# Patient Record
Sex: Female | Born: 1965 | Race: White | Hispanic: No | Smoking: Current some day smoker
Health system: Southern US, Community
[De-identification: ages and names within clinical notes are randomized; demographics above are authoritative.]

## PROBLEM LIST (undated history)

## (undated) DIAGNOSIS — F319 Bipolar disorder, unspecified: Secondary | ICD-10-CM

## (undated) DIAGNOSIS — I493 Ventricular premature depolarization: Secondary | ICD-10-CM

## (undated) DIAGNOSIS — G4700 Insomnia, unspecified: Secondary | ICD-10-CM

## (undated) DIAGNOSIS — F329 Major depressive disorder, single episode, unspecified: Secondary | ICD-10-CM

## (undated) DIAGNOSIS — T783XXA Angioneurotic edema, initial encounter: Secondary | ICD-10-CM

## (undated) DIAGNOSIS — N3010 Interstitial cystitis (chronic) without hematuria: Secondary | ICD-10-CM

## (undated) DIAGNOSIS — B009 Herpesviral infection, unspecified: Secondary | ICD-10-CM

## (undated) DIAGNOSIS — K580 Irritable bowel syndrome with diarrhea: Secondary | ICD-10-CM

## (undated) DIAGNOSIS — F32A Depression, unspecified: Secondary | ICD-10-CM

## (undated) DIAGNOSIS — R937 Abnormal findings on diagnostic imaging of other parts of musculoskeletal system: Secondary | ICD-10-CM

## (undated) DIAGNOSIS — R87629 Unspecified abnormal cytological findings in specimens from vagina: Secondary | ICD-10-CM

## (undated) DIAGNOSIS — I499 Cardiac arrhythmia, unspecified: Secondary | ICD-10-CM

## (undated) DIAGNOSIS — F419 Anxiety disorder, unspecified: Secondary | ICD-10-CM

## (undated) DIAGNOSIS — S92911A Unspecified fracture of right toe(s), initial encounter for closed fracture: Secondary | ICD-10-CM

## (undated) HISTORY — DX: Abnormal findings on diagnostic imaging of other parts of musculoskeletal system: R93.7

## (undated) HISTORY — PX: BACK SURGERY: SHX140

## (undated) HISTORY — DX: Irritable bowel syndrome with diarrhea: K58.0

## (undated) HISTORY — DX: Anxiety disorder, unspecified: F41.9

## (undated) HISTORY — DX: Unspecified abnormal cytological findings in specimens from vagina: R87.629

## (undated) HISTORY — DX: Angioneurotic edema, initial encounter: T78.3XXA

## (undated) HISTORY — DX: Interstitial cystitis (chronic) without hematuria: N30.10

## (undated) HISTORY — DX: Herpesviral infection, unspecified: B00.9

## (undated) HISTORY — DX: Cardiac arrhythmia, unspecified: I49.9

## (undated) HISTORY — DX: Depression, unspecified: F32.A

## (undated) HISTORY — DX: Major depressive disorder, single episode, unspecified: F32.9

---

## 1998-10-11 ENCOUNTER — Encounter (INDEPENDENT_AMBULATORY_CARE_PROVIDER_SITE_OTHER): Payer: Self-pay | Admitting: Internal Medicine

## 1998-10-11 LAB — CONVERTED CEMR LAB: Pap Smear: NORMAL

## 1999-10-29 ENCOUNTER — Other Ambulatory Visit: Admission: RE | Admit: 1999-10-29 | Discharge: 1999-10-29 | Payer: Self-pay | Admitting: Obstetrics and Gynecology

## 1999-11-12 DIAGNOSIS — N3010 Interstitial cystitis (chronic) without hematuria: Secondary | ICD-10-CM | POA: Insufficient documentation

## 1999-11-22 ENCOUNTER — Encounter (INDEPENDENT_AMBULATORY_CARE_PROVIDER_SITE_OTHER): Payer: Self-pay

## 1999-11-22 ENCOUNTER — Ambulatory Visit (HOSPITAL_COMMUNITY): Admission: RE | Admit: 1999-11-22 | Discharge: 1999-11-22 | Payer: Self-pay | Admitting: Urology

## 2000-10-28 ENCOUNTER — Other Ambulatory Visit: Admission: RE | Admit: 2000-10-28 | Discharge: 2000-10-28 | Payer: Self-pay | Admitting: Obstetrics and Gynecology

## 2000-12-26 ENCOUNTER — Ambulatory Visit (HOSPITAL_COMMUNITY): Admission: RE | Admit: 2000-12-26 | Discharge: 2000-12-26 | Payer: Self-pay | Admitting: *Deleted

## 2001-11-12 ENCOUNTER — Other Ambulatory Visit: Admission: RE | Admit: 2001-11-12 | Discharge: 2001-11-12 | Payer: Self-pay | Admitting: Obstetrics and Gynecology

## 2002-11-06 ENCOUNTER — Emergency Department (HOSPITAL_COMMUNITY): Admission: EM | Admit: 2002-11-06 | Discharge: 2002-11-06 | Payer: Self-pay | Admitting: *Deleted

## 2003-04-15 ENCOUNTER — Other Ambulatory Visit: Admission: RE | Admit: 2003-04-15 | Discharge: 2003-04-15 | Payer: Self-pay | Admitting: Obstetrics and Gynecology

## 2004-05-08 ENCOUNTER — Other Ambulatory Visit: Admission: RE | Admit: 2004-05-08 | Discharge: 2004-05-08 | Payer: Self-pay | Admitting: Obstetrics and Gynecology

## 2004-05-09 ENCOUNTER — Other Ambulatory Visit: Admission: RE | Admit: 2004-05-09 | Discharge: 2004-05-09 | Payer: Self-pay | Admitting: Obstetrics and Gynecology

## 2004-09-19 ENCOUNTER — Ambulatory Visit: Payer: Self-pay | Admitting: Internal Medicine

## 2004-12-24 ENCOUNTER — Ambulatory Visit: Payer: Self-pay | Admitting: Family Medicine

## 2005-01-29 ENCOUNTER — Emergency Department (HOSPITAL_COMMUNITY): Admission: EM | Admit: 2005-01-29 | Discharge: 2005-01-29 | Payer: Self-pay | Admitting: Family Medicine

## 2005-02-16 ENCOUNTER — Emergency Department (HOSPITAL_COMMUNITY): Admission: EM | Admit: 2005-02-16 | Discharge: 2005-02-16 | Payer: Self-pay | Admitting: Emergency Medicine

## 2005-02-19 ENCOUNTER — Other Ambulatory Visit: Admission: RE | Admit: 2005-02-19 | Discharge: 2005-02-19 | Payer: Self-pay | Admitting: Obstetrics and Gynecology

## 2005-06-11 ENCOUNTER — Ambulatory Visit: Payer: Self-pay | Admitting: Family Medicine

## 2005-10-02 ENCOUNTER — Other Ambulatory Visit: Admission: RE | Admit: 2005-10-02 | Discharge: 2005-10-02 | Payer: Self-pay | Admitting: Obstetrics and Gynecology

## 2005-10-24 ENCOUNTER — Ambulatory Visit: Payer: Self-pay | Admitting: Family Medicine

## 2005-11-25 ENCOUNTER — Ambulatory Visit: Payer: Self-pay | Admitting: Family Medicine

## 2006-04-09 ENCOUNTER — Ambulatory Visit: Payer: Self-pay | Admitting: Family Medicine

## 2006-07-23 ENCOUNTER — Ambulatory Visit: Payer: Self-pay | Admitting: Family Medicine

## 2007-03-30 ENCOUNTER — Ambulatory Visit: Payer: Self-pay | Admitting: Family Medicine

## 2007-03-30 DIAGNOSIS — F411 Generalized anxiety disorder: Secondary | ICD-10-CM | POA: Insufficient documentation

## 2007-07-10 ENCOUNTER — Encounter: Admission: RE | Admit: 2007-07-10 | Discharge: 2007-07-10 | Payer: Self-pay | Admitting: Obstetrics and Gynecology

## 2007-08-21 ENCOUNTER — Ambulatory Visit (HOSPITAL_BASED_OUTPATIENT_CLINIC_OR_DEPARTMENT_OTHER): Admission: RE | Admit: 2007-08-21 | Discharge: 2007-08-21 | Payer: Self-pay | Admitting: Urology

## 2007-08-21 ENCOUNTER — Encounter (INDEPENDENT_AMBULATORY_CARE_PROVIDER_SITE_OTHER): Payer: Self-pay | Admitting: Urology

## 2007-11-27 ENCOUNTER — Ambulatory Visit: Payer: Self-pay | Admitting: Family Medicine

## 2007-11-27 DIAGNOSIS — R059 Cough, unspecified: Secondary | ICD-10-CM | POA: Insufficient documentation

## 2007-11-27 DIAGNOSIS — R5381 Other malaise: Secondary | ICD-10-CM | POA: Insufficient documentation

## 2007-11-27 DIAGNOSIS — R5383 Other fatigue: Secondary | ICD-10-CM

## 2007-11-27 DIAGNOSIS — R05 Cough: Secondary | ICD-10-CM

## 2007-11-27 DIAGNOSIS — R071 Chest pain on breathing: Secondary | ICD-10-CM | POA: Insufficient documentation

## 2007-12-09 ENCOUNTER — Telehealth (INDEPENDENT_AMBULATORY_CARE_PROVIDER_SITE_OTHER): Payer: Self-pay | Admitting: Internal Medicine

## 2007-12-29 ENCOUNTER — Telehealth (INDEPENDENT_AMBULATORY_CARE_PROVIDER_SITE_OTHER): Payer: Self-pay | Admitting: Internal Medicine

## 2007-12-30 ENCOUNTER — Telehealth (INDEPENDENT_AMBULATORY_CARE_PROVIDER_SITE_OTHER): Payer: Self-pay | Admitting: Internal Medicine

## 2008-03-16 ENCOUNTER — Ambulatory Visit: Payer: Self-pay | Admitting: Internal Medicine

## 2008-03-16 DIAGNOSIS — F329 Major depressive disorder, single episode, unspecified: Secondary | ICD-10-CM | POA: Insufficient documentation

## 2008-03-16 DIAGNOSIS — R51 Headache: Secondary | ICD-10-CM | POA: Insufficient documentation

## 2008-03-16 DIAGNOSIS — R519 Headache, unspecified: Secondary | ICD-10-CM | POA: Insufficient documentation

## 2008-03-30 ENCOUNTER — Encounter (INDEPENDENT_AMBULATORY_CARE_PROVIDER_SITE_OTHER): Payer: Self-pay | Admitting: Internal Medicine

## 2008-05-26 ENCOUNTER — Ambulatory Visit: Payer: Self-pay | Admitting: Family Medicine

## 2008-05-26 DIAGNOSIS — M79609 Pain in unspecified limb: Secondary | ICD-10-CM | POA: Insufficient documentation

## 2008-05-26 DIAGNOSIS — T23079A Burn of unspecified degree of unspecified wrist, initial encounter: Secondary | ICD-10-CM | POA: Insufficient documentation

## 2008-06-02 ENCOUNTER — Telehealth: Payer: Self-pay | Admitting: Family Medicine

## 2008-06-21 ENCOUNTER — Telehealth (INDEPENDENT_AMBULATORY_CARE_PROVIDER_SITE_OTHER): Payer: Self-pay | Admitting: Internal Medicine

## 2008-07-13 ENCOUNTER — Telehealth (INDEPENDENT_AMBULATORY_CARE_PROVIDER_SITE_OTHER): Payer: Self-pay | Admitting: Internal Medicine

## 2008-08-02 ENCOUNTER — Ambulatory Visit: Payer: Self-pay | Admitting: Family Medicine

## 2008-08-02 DIAGNOSIS — G4700 Insomnia, unspecified: Secondary | ICD-10-CM | POA: Insufficient documentation

## 2008-08-02 DIAGNOSIS — K5289 Other specified noninfective gastroenteritis and colitis: Secondary | ICD-10-CM | POA: Insufficient documentation

## 2008-08-18 ENCOUNTER — Telehealth (INDEPENDENT_AMBULATORY_CARE_PROVIDER_SITE_OTHER): Payer: Self-pay | Admitting: Internal Medicine

## 2008-08-24 ENCOUNTER — Ambulatory Visit: Payer: Self-pay | Admitting: Family Medicine

## 2008-08-24 DIAGNOSIS — N390 Urinary tract infection, site not specified: Secondary | ICD-10-CM | POA: Insufficient documentation

## 2008-08-24 LAB — CONVERTED CEMR LAB
Bilirubin Urine: NEGATIVE
Glucose, Urine, Semiquant: NEGATIVE
Ketones, urine, test strip: NEGATIVE
Nitrite: NEGATIVE
Specific Gravity, Urine: 1.02
Urobilinogen, UA: 0.2

## 2008-08-25 ENCOUNTER — Telehealth (INDEPENDENT_AMBULATORY_CARE_PROVIDER_SITE_OTHER): Payer: Self-pay | Admitting: Internal Medicine

## 2008-09-02 ENCOUNTER — Ambulatory Visit: Payer: Self-pay | Admitting: Family Medicine

## 2008-09-29 ENCOUNTER — Telehealth (INDEPENDENT_AMBULATORY_CARE_PROVIDER_SITE_OTHER): Payer: Self-pay | Admitting: Internal Medicine

## 2008-10-26 ENCOUNTER — Ambulatory Visit: Payer: Self-pay | Admitting: Family Medicine

## 2008-11-14 ENCOUNTER — Telehealth: Payer: Self-pay | Admitting: Family Medicine

## 2009-03-16 ENCOUNTER — Ambulatory Visit: Payer: Self-pay | Admitting: Family Medicine

## 2009-03-16 DIAGNOSIS — J309 Allergic rhinitis, unspecified: Secondary | ICD-10-CM | POA: Insufficient documentation

## 2009-03-16 DIAGNOSIS — H9209 Otalgia, unspecified ear: Secondary | ICD-10-CM | POA: Insufficient documentation

## 2009-03-30 ENCOUNTER — Telehealth (INDEPENDENT_AMBULATORY_CARE_PROVIDER_SITE_OTHER): Payer: Self-pay | Admitting: Internal Medicine

## 2009-04-17 ENCOUNTER — Telehealth: Payer: Self-pay | Admitting: Family Medicine

## 2009-04-19 ENCOUNTER — Ambulatory Visit: Payer: Self-pay | Admitting: Family Medicine

## 2009-04-19 DIAGNOSIS — M62838 Other muscle spasm: Secondary | ICD-10-CM | POA: Insufficient documentation

## 2009-08-22 ENCOUNTER — Telehealth (INDEPENDENT_AMBULATORY_CARE_PROVIDER_SITE_OTHER): Payer: Self-pay | Admitting: Internal Medicine

## 2009-09-14 ENCOUNTER — Telehealth: Payer: Self-pay | Admitting: Family Medicine

## 2009-09-21 ENCOUNTER — Telehealth: Payer: Self-pay | Admitting: Family Medicine

## 2009-11-11 HISTORY — PX: OTHER SURGICAL HISTORY: SHX169

## 2009-11-11 HISTORY — PX: GANGLION CYST EXCISION: SHX1691

## 2009-12-13 ENCOUNTER — Ambulatory Visit: Payer: Self-pay | Admitting: Family Medicine

## 2009-12-28 ENCOUNTER — Emergency Department (HOSPITAL_COMMUNITY): Admission: EM | Admit: 2009-12-28 | Discharge: 2009-12-28 | Payer: Self-pay | Admitting: Family Medicine

## 2010-01-02 ENCOUNTER — Telehealth: Payer: Self-pay | Admitting: Family Medicine

## 2010-01-22 ENCOUNTER — Telehealth: Payer: Self-pay | Admitting: Family Medicine

## 2010-01-25 ENCOUNTER — Ambulatory Visit: Payer: Self-pay | Admitting: Family Medicine

## 2010-01-25 DIAGNOSIS — R0789 Other chest pain: Secondary | ICD-10-CM | POA: Insufficient documentation

## 2010-01-31 ENCOUNTER — Telehealth: Payer: Self-pay | Admitting: Family Medicine

## 2010-01-31 ENCOUNTER — Ambulatory Visit: Payer: Self-pay | Admitting: Cardiology

## 2010-01-31 DIAGNOSIS — R0989 Other specified symptoms and signs involving the circulatory and respiratory systems: Secondary | ICD-10-CM

## 2010-01-31 DIAGNOSIS — R0609 Other forms of dyspnea: Secondary | ICD-10-CM | POA: Insufficient documentation

## 2010-02-09 ENCOUNTER — Encounter: Payer: Self-pay | Admitting: Cardiology

## 2010-02-09 ENCOUNTER — Ambulatory Visit: Payer: Self-pay | Admitting: Cardiovascular Disease

## 2010-02-09 ENCOUNTER — Ambulatory Visit (HOSPITAL_COMMUNITY): Admission: RE | Admit: 2010-02-09 | Discharge: 2010-02-09 | Payer: Self-pay | Admitting: Cardiology

## 2010-02-09 ENCOUNTER — Ambulatory Visit: Payer: Self-pay

## 2010-02-16 ENCOUNTER — Telehealth: Payer: Self-pay | Admitting: Cardiology

## 2010-02-27 ENCOUNTER — Telehealth (INDEPENDENT_AMBULATORY_CARE_PROVIDER_SITE_OTHER): Payer: Self-pay | Admitting: *Deleted

## 2010-02-28 ENCOUNTER — Telehealth: Payer: Self-pay | Admitting: Cardiology

## 2010-02-28 ENCOUNTER — Ambulatory Visit: Payer: Self-pay

## 2010-02-28 ENCOUNTER — Encounter (HOSPITAL_COMMUNITY): Admission: RE | Admit: 2010-02-28 | Discharge: 2010-05-15 | Payer: Self-pay | Admitting: Cardiology

## 2010-02-28 ENCOUNTER — Encounter: Payer: Self-pay | Admitting: Cardiology

## 2010-02-28 ENCOUNTER — Ambulatory Visit: Payer: Self-pay | Admitting: Cardiology

## 2010-02-28 ENCOUNTER — Telehealth: Payer: Self-pay | Admitting: Family Medicine

## 2010-02-28 DIAGNOSIS — I472 Ventricular tachycardia: Secondary | ICD-10-CM | POA: Insufficient documentation

## 2010-02-28 DIAGNOSIS — I4729 Other ventricular tachycardia: Secondary | ICD-10-CM | POA: Insufficient documentation

## 2010-03-01 ENCOUNTER — Telehealth: Payer: Self-pay | Admitting: Family Medicine

## 2010-03-01 LAB — CONVERTED CEMR LAB
BUN: 10 mg/dL (ref 6–23)
CO2: 26 meq/L (ref 19–32)
Calcium: 9.5 mg/dL (ref 8.4–10.5)
Chloride: 100 meq/L (ref 96–112)
Creatinine, Ser: 0.9 mg/dL (ref 0.4–1.2)
Glucose, Bld: 88 mg/dL (ref 70–99)

## 2010-03-06 ENCOUNTER — Encounter (INDEPENDENT_AMBULATORY_CARE_PROVIDER_SITE_OTHER): Payer: Self-pay | Admitting: *Deleted

## 2010-03-13 ENCOUNTER — Ambulatory Visit: Payer: Self-pay | Admitting: Cardiology

## 2010-03-13 ENCOUNTER — Ambulatory Visit (HOSPITAL_COMMUNITY): Admission: RE | Admit: 2010-03-13 | Discharge: 2010-03-13 | Payer: Self-pay | Admitting: Cardiology

## 2010-03-27 ENCOUNTER — Telehealth: Payer: Self-pay | Admitting: Family Medicine

## 2010-03-29 ENCOUNTER — Ambulatory Visit: Payer: Self-pay | Admitting: Internal Medicine

## 2010-04-06 ENCOUNTER — Ambulatory Visit: Payer: Self-pay | Admitting: Cardiology

## 2010-04-06 DIAGNOSIS — I428 Other cardiomyopathies: Secondary | ICD-10-CM | POA: Insufficient documentation

## 2010-04-11 ENCOUNTER — Telehealth: Payer: Self-pay | Admitting: Cardiology

## 2010-04-12 LAB — CONVERTED CEMR LAB: TSH: 1.38 microintl units/mL (ref 0.35–5.50)

## 2010-04-17 ENCOUNTER — Ambulatory Visit: Payer: Self-pay | Admitting: Cardiology

## 2010-04-25 ENCOUNTER — Telehealth: Payer: Self-pay | Admitting: Cardiology

## 2010-06-05 ENCOUNTER — Ambulatory Visit: Payer: Self-pay | Admitting: Family Medicine

## 2010-06-05 DIAGNOSIS — M6740 Ganglion, unspecified site: Secondary | ICD-10-CM | POA: Insufficient documentation

## 2010-06-05 LAB — CONVERTED CEMR LAB
Ketones, urine, test strip: NEGATIVE
Nitrite: NEGATIVE
Specific Gravity, Urine: >=1.03

## 2010-06-12 ENCOUNTER — Telehealth: Payer: Self-pay | Admitting: Family Medicine

## 2010-06-13 ENCOUNTER — Encounter (INDEPENDENT_AMBULATORY_CARE_PROVIDER_SITE_OTHER): Payer: Self-pay | Admitting: *Deleted

## 2010-06-13 ENCOUNTER — Telehealth: Payer: Self-pay | Admitting: Family Medicine

## 2010-06-18 ENCOUNTER — Encounter: Payer: Self-pay | Admitting: Family Medicine

## 2010-06-19 ENCOUNTER — Telehealth: Payer: Self-pay | Admitting: Family Medicine

## 2010-06-25 ENCOUNTER — Telehealth: Payer: Self-pay | Admitting: Cardiology

## 2010-06-25 ENCOUNTER — Encounter: Payer: Self-pay | Admitting: Cardiology

## 2010-06-28 ENCOUNTER — Ambulatory Visit (HOSPITAL_BASED_OUTPATIENT_CLINIC_OR_DEPARTMENT_OTHER): Admission: RE | Admit: 2010-06-28 | Discharge: 2010-06-28 | Payer: Self-pay | Admitting: Orthopedic Surgery

## 2010-08-11 DIAGNOSIS — R937 Abnormal findings on diagnostic imaging of other parts of musculoskeletal system: Secondary | ICD-10-CM

## 2010-08-11 HISTORY — DX: Abnormal findings on diagnostic imaging of other parts of musculoskeletal system: R93.7

## 2010-08-12 ENCOUNTER — Emergency Department (HOSPITAL_COMMUNITY): Admission: EM | Admit: 2010-08-12 | Discharge: 2010-08-12 | Payer: Self-pay | Admitting: Emergency Medicine

## 2010-08-13 ENCOUNTER — Telehealth: Payer: Self-pay | Admitting: Family Medicine

## 2010-08-14 ENCOUNTER — Telehealth: Payer: Self-pay | Admitting: Family Medicine

## 2010-08-14 ENCOUNTER — Encounter (INDEPENDENT_AMBULATORY_CARE_PROVIDER_SITE_OTHER): Payer: Self-pay | Admitting: *Deleted

## 2010-08-14 ENCOUNTER — Ambulatory Visit: Payer: Self-pay | Admitting: Family Medicine

## 2010-08-14 DIAGNOSIS — M5412 Radiculopathy, cervical region: Secondary | ICD-10-CM | POA: Insufficient documentation

## 2010-08-16 ENCOUNTER — Encounter: Admission: RE | Admit: 2010-08-16 | Discharge: 2010-08-16 | Payer: Self-pay | Admitting: Family Medicine

## 2010-08-20 ENCOUNTER — Encounter: Payer: Self-pay | Admitting: Family Medicine

## 2010-08-23 ENCOUNTER — Ambulatory Visit (HOSPITAL_COMMUNITY): Admission: RE | Admit: 2010-08-23 | Discharge: 2010-08-24 | Payer: Self-pay | Admitting: Neurosurgery

## 2010-09-07 ENCOUNTER — Telehealth: Payer: Self-pay | Admitting: Family Medicine

## 2010-10-15 ENCOUNTER — Encounter: Payer: Self-pay | Admitting: Family Medicine

## 2010-10-30 ENCOUNTER — Ambulatory Visit: Payer: Self-pay | Admitting: Family Medicine

## 2010-10-30 DIAGNOSIS — J020 Streptococcal pharyngitis: Secondary | ICD-10-CM | POA: Insufficient documentation

## 2010-11-08 ENCOUNTER — Telehealth: Payer: Self-pay | Admitting: Family Medicine

## 2010-11-19 ENCOUNTER — Telehealth: Payer: Self-pay | Admitting: Family Medicine

## 2010-12-02 ENCOUNTER — Encounter: Payer: Self-pay | Admitting: Obstetrics and Gynecology

## 2010-12-13 NOTE — Consult Note (Signed)
Summary: [facility]  [facility]   Imported By: Lanelle Bal 09/03/2010 08:24:24  _____________________________________________________________________  External Attachment:    Type:   Image     Comment:   External Document  Appended Document: [facility]    Clinical Lists Changes  Observations: Added new observation of PAST SURG HX: Ganglion cyst per Dr. Teressa Senter 2011 C6-7 fusion planned per Dr. [name] 2011 (09/03/2010 13:46)       Past History:  Past Surgical History: Ganglion cyst per Dr. Teressa Senter 2011 C6-7 fusion planned per Dr. [name] 2011

## 2010-12-13 NOTE — Letter (Signed)
Summary: [facility] Surgical Clearance   [facility] Clear[facility]   Imported By: Roderic Ovens 06/28/2010 15:21:23  _____________________________________________________________________  External Attachment:    Type:   Image     Comment:   External Document

## 2010-12-13 NOTE — Progress Notes (Signed)
Summary: refill request for ambien  Phone Note Refill Request Message from:  Fax from Pharmacy  Refills Requested: Medication #1:  AMBIEN 10 MG TABS 1/2 to 1 at bedtime as needed insomnia   Last Refilled: 01/31/2010 Faxed request from [facility], [phone].  Initial call taken by: Lowella Petties CMA,  February 28, 2010 12:34 PM    Prescriptions: AMBIEN 10 MG TABS (ZOLPIDEM TARTRATE) 1/2 to 1 at bedtime as needed insomnia  #30 x 5   Entered and Authorized by:   Shaune Leeks MD   Signed by:   Shaune Leeks MD on 02/28/2010   Method used:   Telephoned to ...       [facility]* (retail)       [address]       [place], [place]  [address]       Ph: [phone]       Fax: [phone]   RxID:   [id number]

## 2010-12-13 NOTE — Progress Notes (Signed)
Summary: refill request for ambien  Phone Note Refill Request Message from:  Fax from Pharmacy  Refills Requested: Medication #1:  AMBIEN 10 MG TABS 1/2 to 1 at bedtime as needed insomnia   Last Refilled: 01/31/2010 Faxed request from [facility], [phone].  Initial call taken by: Lowella Petties CMA,  March 01, 2010 4:53 PM  Follow-up for Phone Call        Done yesterday. Follow-up by: Shaune Leeks MD,  March 01, 2010 5:09 PM  Additional Follow-up for Phone Call Additional follow up Details #1::        Rob at [facility] notified by telephone. Additional Follow-up by: Sydell Axon LPN,  March 01, 2010 5:18 PM

## 2010-12-13 NOTE — Progress Notes (Signed)
Summary: pt requests diflucan  Phone Note Call from Patient Call back at Home Phone [phone]   Caller: Patient Call For: Dr. Para March Summary of Call: Pt states she has a yeast infection after taking antibiotic- itching, irritation.  She is asking for diflucan to be called to [facility]. Initial call taken by: Lowella Petties CMA, AAMA,  November 08, 2010 9:40 AM  Follow-up for Phone Call        rx sent.  Follow-up by: Crawford Givens MD,  November 08, 2010 9:55 AM  Additional Follow-up for Phone Call Additional follow up Details #1::        Patient Advised.  Additional Follow-up by: Delilah Shan CMA Duncan Dull),  November 08, 2010 12:48 PM    New/Updated Medications: DIFLUCAN 150 MG TABS (FLUCONAZOLE) 1 by mouth today and repeat in 1 week if needed. Prescriptions: DIFLUCAN 150 MG TABS (FLUCONAZOLE) 1 by mouth today and repeat in 1 week if needed.  #2 x 0   Entered and Authorized by:   Crawford Givens MD   Signed by:   Crawford Givens MD on 11/08/2010   Method used:   Electronically to        [facility]* (retail)       [address]       [place], [place]  [address]       Ph: [phone]       Fax: [phone]   RxID:   [id number]

## 2010-12-13 NOTE — Assessment & Plan Note (Signed)
Summary: hospital f/u/ Tricia [name]/alc   Vital Signs:  Patient profile:   45 year old female Height:      67.5 inches Weight:      186.4 pounds BMI:     28.87 Temp:     98.1 degrees F oral Pulse rate:   77 / minute Pulse rhythm:   regular BP sitting:   110 / 65  (right arm) Cuff size:   regular  Vitals Entered By: Melody Comas (August 14, 2010 3:28 PM) CC: hospital f/u   History of Present Illness: L arm numb since Sunday AM.  L sided CP.  Seen at ER; recs reviewed.  unremarkable exam from cards stand point.  CE were negative.  Dx'd with likely chest [place] pain.   Less pain with repositioning of L arm.  "it's like my arm is on fire down the side."  No neck pain and chest pain is resolved.  Sore on the back of L shoulder at scapula.  Oxycodone isn't helping the pain.  L arm is still numb.  Pt can move arm, no decrease in range of motion at elbow.  normal range of motion of the fingers.  No rash.  No FCNAV.    Allergies: 1)  ! Biaxin  Review of Systems       See HPI.  Otherwise negative.    Physical Exam  General:  A&O but uncomfortable NCAT MMM  neck w/o midline pain posteriorly.  supple but with decrease in range of motion for rotation due to pain on L c spine paraspinal muscles.  spurling's + No decrease in range of motion for shoulder but pain with abduction above the head. No impingement distally motor exam is wnl but change in sensation on L radial distribution L grip liimited by pain normal DTRs. 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  started sterapred pack and titrate gabapentin.  send for MRI and consider NS eval after that.  She understands.   Orders: Radiology Referral (Radiology)  Complete Medication List: 1)  Fluoxetine Hcl 20 Mg Caps (Fluoxetine hcl) .... Take 1 capsule by mouth once a day 2)  Valtrex 500 Mg Tabs (Valacyclovir hcl) .... Take  1 tablet by mouth twice a day 3)  Ambien 10 Mg Tabs (Zolpidem tartrate) .... 1/2 to 1 at bedtime as needed insomnia 4)  Buspar 15 Mg Tabs (Buspirone hcl) .... One tab by mouth two times a day 5)  Ocella 3-0.03 Mg Tabs (Drospirenone-ethinyl estradiol) .... Take one by mouth daily 6)  Oscella  .... Uad 7)  Toprol Xl 25 Mg Xr24h-tab (Metoprolol succinate) .... One twice a day 8)  Oxycodone-acetaminophen 5-325 Mg Tabs (Oxycodone-acetaminophen) .... Take 2 by mouth every 6 hours 9)  Naproxen 500 Mg Tabs (Naproxen) .... Take one by mouth two time daily 10)  Prednisone (pak) 10 Mg Tabs (Prednisone) .Marland Kitchen.. 12 day pack, take as directed with food. 11)  Gabapentin 300 Mg Caps (Gabapentin) .Marland Kitchen.. 1 by mouth at bedtime, increase up to 1 by mouth three times a day if pain persists and patient tolerates  Patient Instructions: 1)  Take the prednisone as directed with food and gradually increase the gabapentin dose (as long as it doesn't make your drowsy). 2)  See Shirlee Limerick about your referral before your leave today.  We'll contact you with the results of the MRI.  Prescriptions: GABAPENTIN 300 MG CAPS (GABAPENTIN) 1  by mouth at bedtime, increase up to 1 by mouth three times a day if pain persists and patient tolerates  #40 x 1   Entered and Authorized by:   Crawford Givens MD   Signed by:   Crawford Givens MD on 08/14/2010   Method used:   Electronically to        [facility]* (retail)       [address]       [place], [place]  [address]       Ph: [phone]       Fax: [phone]   RxID:   [id number] PREDNISONE (PAK) 10 MG TABS (PREDNISONE) 12 day pack, take as directed with food.  #1 pack x 0   Entered and Authorized by:   Crawford Givens MD   Signed by:   Crawford Givens MD on 08/14/2010   Method used:   Electronically to        [facility]* (retail)       [address]       [place], [place]  [address]       Ph: [phone]       Fax: [phone]   RxID:   [id number]   Current Allergies  (reviewed today): ! BIAXIN

## 2010-12-13 NOTE — Progress Notes (Signed)
Summary: monitor results 04/17/10-04/19/10  Phone Note Outgoing Call   Call placed by: Katina Dung, RN, BSN,  April 25, 2010 5:56 PM Call placed to: Patient Summary of Call: monitor results  Follow-up for Phone Call        Dr Shirlee Latch reviewed monitor 04/17/10-04/19/10-fairly frequent PVC's -he recommended increase Toprol XL 25mg  to two times a day --I talked with pt by telephone and she agreed to increase Toprol xl 25mg  to two times a day     New/Updated Medications: TOPROL XL 25 MG XR24H-TAB (METOPROLOL SUCCINATE) one twice a day Prescriptions: TOPROL XL 25 MG XR24H-TAB (METOPROLOL SUCCINATE) one twice a day  #60 x 11   Entered by:   Katina Dung, RN, BSN   Authorized by:   Marca Ancona, MD   Signed by:   Katina Dung, RN, BSN on 04/25/2010   Method used:   Electronically to        [facility]* (retail)       [address]       [place], [place]  [address]       Ph: [phone]       Fax: [phone]   RxID:   [id number]

## 2010-12-13 NOTE — Assessment & Plan Note (Signed)
Summary: FLU  LIKE SYMPTOM/DLO   Vital Signs:  Patient profile:   45 year old female Weight:      181 pounds BMI:     28.03 Temp:     98.1 degrees F oral Pulse rate:   72 / minute Pulse rhythm:   regular BP sitting:   100 / 60  (left arm) Cuff size:   regular  Vitals Entered By: Sydell Axon LPN (December 13, 2009 12:15 PM) CC: Flu like symptoms, body aches, fever, nasal congestion and headache   History of Present Illness: Pt here for sxs of 11/2 weeks...works in school. Now has been in bed last few days with pressure and body aches. She has been in bed aching since Mon...now 48 hrs.  She is overwhelmed with achiness...she has pain in the facial distribution. She has had minimal fever but lots of chills. IBP has caused sweats. She stayed in bed all day yesterday. Her mother brought her here. No eaqr pain but pressure in the TMJ and eye areas. Nasal congestion with PND. no ST. She has cough with laying down, not severe. Some SOB, no N/V, some diarrhea, some dizziness with getting up, not bad enough to cause nausea. She has taken IBP, Aleve , Nyquil.  Allergies: 1)  ! Biaxin 2)  ! Elio Forget  Physical Exam  General:  alert, well-developed, well-nourished, and well-hydrated.   Head:  normocephalic, atraumatic, and no abnormalities observed.  Sinuses min tend max distr. Eyes:  Conjunctiva clear bilaterally.  Ears:  External ear exam shows no significant lesions or deformities.  Otoscopic examination reveals clear canals, tympanic membranes are intact bilaterally without bulging, retraction, inflammation or discharge. Hearing is grossly normal bilaterally. Nose:  External nasal examination shows no deformity or inflammation. Nasal mucosa are pink and moist without lesions or exudates. Mildly inflamed. Mouth:  no exudates and pharyngeal erythema.  Mild thick clear PND. Neck:  No deformities, masses, or tenderness noted. Lungs:  Normal respiratory effort, chest expands symmetrically.  Lungs are clear to auscultation, no crackles or wheezes. Heart:  Normal rate and regular rhythm. S1 and S2 normal without gallop, murmur, click, rub or other extra sounds. Cervical Nodes:  Mild shoddy mobile ant cerv nodes.   Impression & Recommendations:  Problem # 1:  URI (ICD-465.9) Assessment New See instructions. Her updated medication list for this problem includes:    Ibuprofen 200 Mg Tabs (Ibuprofen) .Marland Kitchen... As needed    Aleve 220 Mg Tabs (Naproxen sodium) .Marland Kitchen... As needed    Tessalon 200 Mg Caps (Benzonatate) ..... One tab by mouth three times a day  Problem # 2:  SINUSITIS - ACUTE-NOS (ICD-461.9) Assessment: New  Does not appear infected but congested at present. Her updated medication list for this problem includes:    Tessalon 200 Mg Caps (Benzonatate) ..... One tab by mouth three times a day  Instructed on treatment. Call if symptoms persist or worsen.   Complete Medication List: 1)  Fluoxetine Hcl 20 Mg Caps (Fluoxetine hcl) .... Take 1 capsule by mouth once a day 2)  Valtrex 500 Mg Tabs (Valacyclovir hcl) .... Take 1 tablet by mouth twice a day 3)  Elmiron 100 Mg Caps (Pentosan polysulfate sodium) .... As directed 4)  Yasmin 28 3-0.03 Mg Tabs (Drospirenone-ethinyl estradiol) .... Take 1 tablet by mouth once a day 5)  Ambien 10 Mg Tabs (Zolpidem tartrate) .... 1/2 to 1 at bedtime as needed insomnia 6)  Buspar 15 Mg Tabs (Buspirone hcl) .... One tab by  mouth two times a day 7)  Flexeril 10 Mg Tabs (Cyclobenzaprine hcl) .... 1/2-1 tab by mouth three times a day as needed 8)  Ibuprofen 200 Mg Tabs (Ibuprofen) .... As needed 9)  Aleve 220 Mg Tabs (Naproxen sodium) .... As needed 10)  Tamiflu 75 Mg Caps (Oseltamivir phosphate) .... One tab by mouth two times a day 11)  Tessalon 200 Mg Caps (Benzonatate) .... One tab by mouth three times a day  Patient Instructions: 1)  Take Tamiflu. 2)  Take Tessalon for cough as needed. 3)  Take Guaifenesin by going to [facility] or [facility] and getting MUCOUS RELIEF EXPECTORANT (400mg ), take 11/2 tabs by mouth AM and NOON. 4)  Drink lots of fluids anytime taking Guaifenesin.  5)  Take Aleve 2 tabs after brfst and supper Prescriptions: TESSALON 200 MG CAPS (BENZONATATE) one tab by mouth three times a day  #40 x 0   Entered and Authorized by:   Shaune Leeks MD   Signed by:   Shaune Leeks MD on 12/13/2009   Method used:   Electronically to        [facility]* (retail)       [address]       [place], [place]  [address]       Ph: [phone]       Fax: [phone]   RxID:   [id number] TAMIFLU 75 MG CAPS (OSELTAMIVIR PHOSPHATE) one tab by mouth two times a day  #10 x 0   Entered and Authorized by:   Shaune Leeks MD   Signed by:   Shaune Leeks MD on 12/13/2009   Method used:   Electronically to        [facility]* (retail)       [address]       [place], [place]  [address]       Ph: [phone]       Fax: [phone]   RxID:   [id number]   Current Allergies (reviewed today): ! BIAXIN ! Elio Forget

## 2010-12-13 NOTE — Progress Notes (Signed)
Summary: Rx Zolpidem  Phone Note Refill Request Call back at [phone] Message from:  [facility] on January 31, 2010 10:35 AM  Refills Requested: Medication #1:  AMBIEN 10 MG TABS 1/2 to 1 at bedtime as needed insomnia   Last Refilled: 01/02/2010 Received faxed refill request, please advise   Method Requested: Telephone to Pharmacy Initial call taken by: Linde Gillis CMA Duncan Dull),  January 31, 2010 10:36 AM  Follow-up for Phone Call        Rx called to pharmacy Follow-up by: Linde Gillis CMA Duncan Dull),  January 31, 2010 1:01 PM    Prescriptions: AMBIEN 10 MG TABS (ZOLPIDEM TARTRATE) 1/2 to 1 at bedtime as needed insomnia  #30 x 0   Entered and Authorized by:   Ruthe Mannan MD   Signed by:   Ruthe Mannan MD on 01/31/2010   Method used:   Handwritten   RxID:   [id number]

## 2010-12-13 NOTE — Progress Notes (Signed)
Summary: refill request for valacyclovir  Phone Note Refill Request Message from:  Fax from Pharmacy  Refills Requested: Medication #1:  VALTREX 500 MG TABS Take 1 tablet by mouth twice a day   Last Refilled: 05/16/2010 Faxed request from [facility], [phone].  Initial call taken by: Lowella Petties CMA,  June 13, 2010 3:55 PM  Follow-up for Phone Call        done.  Follow-up by: Crawford Givens MD,  June 13, 2010 8:43 PM    Prescriptions: VALTREX 500 MG TABS (VALACYCLOVIR HCL) Take 1 tablet by mouth twice a day  #60 x 6   Entered and Authorized by:   Crawford Givens MD   Signed by:   Crawford Givens MD on 06/13/2010   Method used:   Electronically to        [facility]* (retail)       [address]       [place], [place]  [address]       Ph: [phone]       Fax: [phone]   RxID:   [id number]

## 2010-12-13 NOTE — Progress Notes (Signed)
Summary: Rx Buspirone  Phone Note Refill Request Call back at [phone] Message from:  [facility] on Mar 27, 2010 8:59 AM  Refills Requested: Medication #1:  BUSPAR 15 MG TABS one tab by mouth two times a day   Last Refilled: 02/28/2010 Received faxed refill request please advise.   Method Requested: Telephone to Pharmacy Initial call taken by: Linde Gillis CMA Duncan Dull),  Mar 27, 2010 8:59 AM    Prescriptions: BUSPAR 15 MG TABS (BUSPIRONE HCL) one tab by mouth two times a day  #60 x 5   Entered and Authorized by:   Shaune Leeks MD   Signed by:   Shaune Leeks MD on 03/27/2010   Method used:   Electronically to        [facility]* (retail)       [address]       [place], [place]  [address]       Ph: [phone]       Fax: [phone]   RxID:   [id number]

## 2010-12-13 NOTE — Letter (Signed)
Summary: Out of Work  [facility]  [address] [place], [place] [address]   Phone: [phone]  Fax: [phone]    December 13, 2009   Employee:  ROSIA SYME COBB    To Whom It May Concern:   For Medical reasons, please excuse the above named employee from work for the following dates:  Start:   12/11/2009  End:   12/14/2009  If you need additional information, please feel free to contact our office.         Sincerely,    Shaune Leeks MD

## 2010-12-13 NOTE — Progress Notes (Signed)
Summary: refill request for vicodin  Phone Note Refill Request Message from:  Fax from Pharmacy  Refills Requested: Medication #1:  VICODIN 5-500 MG TABS 1 by mouth three times a day as needed pain   Last Refilled: 06/05/2010 Faxed request from [facility], [phone].   Initial call taken by: Lowella Petties CMA,  June 12, 2010 10:26 AM  Follow-up for Phone Call        okay to call in.  If she is continuing to have abdominal pain, then I want her to follow up with [facility]RO (she has history of interstitial cystitis) Follow-up by: Crawford Givens MD,  June 12, 2010 10:44 AM  Additional Follow-up for Phone Call Additional follow up Details #1::        Medication phoned to pharmacy. Patient Advised.  Additional Follow-up by: Delilah Shan CMA (AAMA),  June 12, 2010 12:09 PM    Prescriptions: VICODIN 5-500 MG TABS (HYDROCODONE-ACETAMINOPHEN) 1 by mouth three times a day as needed pain, sedation caution  #15 x 0   Entered and Authorized by:   Crawford Givens MD   Signed by:   Crawford Givens MD on 06/12/2010   Method used:   Telephoned to ...       [facility]* (retail)       [address]       [place], [place]  [address]       Ph: [phone]       Fax: [phone]   RxID:   [id number]

## 2010-12-13 NOTE — Consult Note (Signed)
Summary: [facility]   [facility]   Imported By: Maryln Gottron 06/22/2010 15:47:52  _____________________________________________________________________  External Attachment:    Type:   Image     Comment:   External Document  Appended Document: [facility]     Clinical Lists Changes  Observations: Added new observation of PAST SURG HX: Ganglion cyst per Dr. Teressa Senter 2011 (06/23/2010 19:37) Added new observation of PAST MED HX: 1. Anxiety/Panic 2. Depression 3. Interstitial cystitis 4. Ventricular ectopy: Patient has been noted to have PVCs and a short run of NSVT while on the treadmill for an ETT.  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  Cardiac MRI (5/11) showed EF 50%, mildly dilated LV, normal RV with no evidence for ARVC, and no myocardial delayed enhancement.  5. Cardiomyopathy: EF 45% by myoview, 45-50% by echo, 50% by MRI.  ? PVC-associated cardiomyopathy.  6. Chest pain: ETT-myoview (4/11) 6'15", frequent PVCs and run NSVT, mild global HK with EF 45%, fixed anterior defect was likely soft tissue attenuation so no evidence for ischemia or infarction.   (06/23/2010 19:37)       Past History:  Past Medical History: 1. Anxiety/Panic 2. Depression 3. Interstitial cystitis 4. Ventricular ectopy: Patient has been noted to have PVCs and a short run of NSVT while on the treadmill for an ETT.  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  Cardiac MRI (5/11) showed EF 50%, mildly dilated LV, normal RV with no evidence for ARVC, and no myocardial delayed enhancement.  5. Cardiomyopathy: EF 45% by myoview, 45-50% by echo, 50% by MRI.  ? PVC-associated cardiomyopathy.  6. Chest pain: ETT-myoview (4/11) 6'15", frequent PVCs and run NSVT, mild global HK with EF 45%, fixed anterior defect was likely  soft tissue attenuation so no evidence for ischemia or infarction.   Past Surgical History: Ganglion cyst per Dr. Teressa Senter 2011

## 2010-12-13 NOTE — Progress Notes (Signed)
Summary: refill request for ambien  Phone Note Refill Request Message from:  Fax from Pharmacy  Refills Requested: Medication #1:  AMBIEN 10 MG TABS 1/2 to 1 at bedtime as needed insomnia   Last Refilled: 07/21/2010 Faxed request from [facility], [phone].  Initial call taken by: Lowella Petties CMA,  August 14, 2010 4:18 PM  Follow-up for Phone Call        please phone in . Follow-up by: Crawford Givens MD,  August 14, 2010 10:58 PM  Additional Follow-up for Phone Call Additional follow up Details #1::        Medication phoned to pharmacy.  Additional Follow-up by: Delilah Shan CMA (AAMA),  August 15, 2010 10:08 AM    Prescriptions: AMBIEN 10 MG TABS (ZOLPIDEM TARTRATE) 1/2 to 1 at bedtime as needed insomnia  #30 x 5   Entered and Authorized by:   Crawford Givens MD   Signed by:   Crawford Givens MD on 08/14/2010   Method used:   Telephoned to ...       [facility]* (retail)       [address]       [place], [place]  [address]       Ph: [phone]       Fax: [phone]   RxID:   [id number]

## 2010-12-13 NOTE — Letter (Signed)
Summary: Out of Work  [facility]  [address] [place], [place] [address]   Phone: [phone]  Fax: [phone]    October 30, 2010   Employee:  Tricia [name]    To Whom It May Concern:   For Medical reasons, please excuse the above named employee from work today and tomorrow.   If you need additional information, please feel free to contact our office.         Sincerely,    Crawford Givens MD

## 2010-12-13 NOTE — Progress Notes (Signed)
Summary: clearance for surgery  Phone Note From Other Clinic   Caller: nurse [facility] Summary of Call: [facility] needs [facility] letter for pt to have a mass removal on left wrist. 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 but call and see how  pt  has been feelling and if she has  had palpitations---needs to stay on Toprol XL 25mg  two times a day --[facility] Katina Dung, RN, BSN  June 25, 2010 2:41 PM  Pt returning call Judie Grieve  June 25, 2010 2:45 PM  Additional Follow-up for Phone Call Additional follow up Details #1::        talked with pt by telephone--she is taking Toprol XL 25mg  two times a day --she states she is having less palpitations and her SOB is much better-Anne Lankford,RN      Appended Document: clearance for surgery Patient is having less palpitations and shortness of breath on Toprol XL.  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  She is cleared for surgery with the caveat that she must continue her beta blocker perioperatively. RVOT tachycardia that occurs in OR can be terminated by beta blockade (IV lopressor).   Appended Document: clearance for surgery faxed to [facility] [phone] attn:Kathy

## 2010-12-13 NOTE — Progress Notes (Signed)
Summary: refill request for vicodin  Phone Note Refill Request Message from:  Fax from Pharmacy  Refills Requested: Medication #1:  VICODIN 5-500 MG TABS 1 by mouth three times a day as needed pain   Last Refilled: 06/12/2010 Faxed request from [place], [phone].  Initial call taken by: Lowella Petties CMA,  June 19, 2010 3:44 PM  Follow-up for Phone Call        fill as before and have patient follow up with urology.   see prev refill note.  Follow-up by: Crawford Givens MD,  June 19, 2010 3:46 PM  Additional Follow-up for Phone Call Additional follow up Details #1::        Patient Advised.  Medication phoned to pharmacy.  Additional Follow-up by: Delilah Shan CMA (AAMA),  June 19, 2010 4:06 PM    Prescriptions: VICODIN 5-500 MG TABS (HYDROCODONE-ACETAMINOPHEN) 1 by mouth three times a day as needed pain, sedation caution  #15 x 0   Entered by:   Delilah Shan CMA (AAMA)   Authorized by:   Crawford Givens MD   Signed by:   Delilah Shan CMA (AAMA) on 06/19/2010   Method used:   Telephoned to ...       [facility]* (retail)       [address]       [place], [place]  [address]       Ph: [phone]       Fax: [phone]   RxID:   [id number]

## 2010-12-13 NOTE — Letter (Signed)
Summary: Out of Work  [facility]  [address] [place], [place] [address]   Phone: [phone]  Fax: [phone]    August 14, 2010   Employee:  Tricia [name]    To Whom It May Concern:   For Medical reasons, please excuse the above named employee from work for the following dates:  Start:   Please excuse recent absences.  End:   Out of work until pain resolved.  If you need additional information, please feel free to contact our office.         Sincerely,    Dwana Curd. Para March, M.D.  [facility]

## 2010-12-13 NOTE — Progress Notes (Signed)
Summary: returning call  Phone Note Call from Patient Call back at Home Phone [phone]   Caller: Patient Reason for Call: Talk to Nurse Summary of Call: returning call  Initial call taken by: Migdalia Dk,  February 16, 2010 8:24 AM  Follow-up for Phone Call        Spoke with pt. Echo results given. Also MD's recommendation to have ETT Myoview  instead of Treadmill. Pt. agreed she would like for the excercise Myoview to be scheduled  the same date and time as the treadmill was. I let pt know. I will order the test and let the scheduler know about pt's request.  Follow-up by: Ollen Gross, RN, BSN,  February 16, 2010 12:09 PM

## 2010-12-13 NOTE — Assessment & Plan Note (Signed)
Summary: 1 month rov.sl   Primary Provider:  Dr. Hetty Ely  CC:  1 month rov.  Pt stopped taking carvedilol due to dizziness.  Tricia [name]  History of Present Illness: 45 yo with history of fatigue, exertional dyspnea, and chest pain returns for cardiology evaluation.  I set her up for an ETT-myoview given exertional dyspnea and chest pain.  This showed no evidence for ischemia or infarction but she did have very frequent ventricular ectopy and a run of NSVT.  This had a RBBB/inferior axis pattern suggesting RVOT tachycardia.  Echo, myoview, and cardiac MRI all showed mildly decreased LV systolic function, EF 45-50%.  Cardiac MRI showed no evidence for ARVC and no myocardial delayed enhancement.    Patient continues to have chest pain.  This is basically constant and mild.  It is reproducible with palpation of left chest.  At last appointment, I started the patient on Coreg 6.25 mg two times a day.  She had dizziness with taking Coreg that resolved completely when she stopped it, so she is no longer taking Coreg.  She continues to have dyspnea with moderate exertion such as walking up steps.  She has no trouble on flat ground.  She gets short of breath with emotional stress.  Of note, she does not feel palpitations.  She is fatigued in general.    Labs (4/11): K 4.4, creatinine 0.9 Labs (5/11): BNP 52, TSH normal  Current Medications (verified): 1)  Fluoxetine Hcl 20 Mg Caps (Fluoxetine Hcl) .... Take 1 Capsule By Mouth Once A Day 2)  Valtrex 500 Mg Tabs (Valacyclovir Hcl) .... Take 1 Tablet By Mouth Twice A Day 3)  Elmiron 100 Mg  Caps (Pentosan Polysulfate Sodium) .... As Directed 4)  Ambien 10 Mg Tabs (Zolpidem Tartrate) .... 1/2 To 1 At Bedtime As Needed Insomnia 5)  Buspar 15 Mg Tabs (Buspirone Hcl) .... One Tab By Mouth Two Times A Day 6)  Ocella 3-0.03 Mg Tabs (Drospirenone-Ethinyl Estradiol) .... Take One By Mouth Daily 7)  Oscella .... Uad  Allergies (verified): 1)  ! Biaxin  Past  History:  Past Medical History: 1. Anxiety/Panic 2. Depression 3. Interstitial cystitis 4. Ventricular ectopy: Patient has been noted to have PVCs and a short run of NSVT while on the treadmill for an ETT.  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  Cardiac MRI (5/11) showed EF 50%, mildly dilated LV, normal RV with no evidence for ARVC, and no myocardial delayed enhancement.  5. Cardiomyopathy: EF 45% by myoview, 45-50% by echo, 50% by MRI.  ? PVC-associated cardiomyopathy.  6. Chest pain: ETT-myoview (4/11) 6'15", frequent PVCs and run NSVT, mild global HK with EF 45%, fixed anterior defect was likely soft tissue attenuation so no evidence for ischemia or infarction.   Family History: Reviewed history from 01/31/2010 and no changes required. Maternal grandfather with CABG at 19. Parents healthy. Cousin with aortic dissection.   Social History: Reviewed history from 01/31/2010 and no changes required. Occupation: Passenger transport manager at [employer] Separated--1 child Never Smoked Alcohol use-occ No drugs Lives in [place]  Review of Systems       All systems reviewed and negative except as per HPI.   Vital Signs:  Patient profile:   45 year old female Height:      67.5 inches Weight:      179 pounds BMI:     27.72 Pulse rate:   75 / minute Pulse rhythm:   regular BP sitting:   114 /  78  (left arm) Cuff size:   regular  Vitals Entered By: Judithe Modest CMA (Apr 06, 2010 10:24 AM)  Physical Exam  General:  Well developed, well nourished, in no acute distress. Neck:  Neck supple, no JVD. No masses, thyromegaly or abnormal cervical nodes. Chest Liszewski:  Tender left upper chest Lungs:  Clear bilaterally to auscultation and percussion. Heart:  Non-displaced PMI, regular rate and rhythm, S1, S2 without murmurs, rubs or gallops. Carotid upstroke normal, no bruit.  Pedals normal pulses. No edema, no varicosities. Abdomen:  Bowel  sounds positive; abdomen soft and non-tender without masses, organomegaly, or hernias noted. No hepatosplenomegaly. Extremities:  No clubbing or cyanosis. Neurologic:  Alert and oriented x 3. Psych:  Normal affect.   Impression & Recommendations:  Problem # 1:  VENTRICULAR TACHYCARDIA (ICD-427.1) Patient has had PVCs and NSVT with RBBB/inferior axis.  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  MRI showed no evidence for ARVC and no myocardial delayed enhancement.  She stopped Coreg due to dizziness.  I think she should be on a beta blocker to try to suppress the ventricular ectopy so will start Toprol XL 25 mg daily.  I will then get a holter monitor (on Toprol XL).  If she has a significant amount of ectopy still, could consider suppression with flecainide, especially given concern for PVC-mediated tachycardia.   Problem # 2:  DYSPNEA ON EXERTION (ICD-786.09) Reproducible with palpation of chest Kobrin.  No evidence for ischemia or infarction on myoview.  I suspect that the chest pain is musculoskeletal.   Problem # 3:  CARDIOMYOPATHY (ICD-425.4) EF is mildly decreased, in the range of 45-50%.  Mild cardiomyopathy may be PVC-mediated.  Study of this phenomenon has suggested increased risk when PVC burdern is > 24% of total beats, which is likely more than this patient's burden.  I will get a holter monitor on Toprol XL to assess PVC burden.  She will start Toprol XL, and if she tolerates this, would consider adding low dose ACEI.    Other Orders: Holter Monitor (Holter Monitor) TLB-BNP (B-Natriuretic Peptide) (83880-BNPR) TLB-TSH (Thyroid Stimulating Hormone) [phone])  Patient Instructions: 1)  Your physician has recommended you make the following change in your medication:  2)  Start Toprol XL(metoprolol) 25mg  daily 3)  Lab today --BNP/TSH 786.09 427.1 4)  Your physician has recommended that you wear a holter monitor.  Holter monitors are medical devices that record the heart's electrical activity.  Doctors most often use these monitors to diagnose arrhythmias. Arrhythmias are problems with the speed or rhythm of the heartbeat. The monitor is a small, portable device. You can wear one while you do your normal daily activities. This is usually used to diagnose what is causing palpitations/syncope (passing out). 48 hour monitor 5)  Your physician recommends that you schedule a follow-up appointment in: 3-4 weeks with Dr Shirlee Latch. Prescriptions: TOPROL XL 25 MG XR24H-TAB (METOPROLOL SUCCINATE) one daily  #30 x 11   Entered by:   Katina Dung, RN, BSN   Authorized by:   Marca Ancona, MD   Signed by:   Katina Dung, RN, BSN on 04/06/2010   Method used:   Electronically to        [facility]* (retail)       [address]       [place], [place]  [address]       Ph: [phone]       Fax: [phone]   RxID:   [id number]

## 2010-12-13 NOTE — Progress Notes (Signed)
Summary: returning call  Phone Note Call from Patient Call back at Home Phone [phone]   Caller: Patient Reason for Call: Talk to Nurse Summary of Call: returning call Initial call taken by: Migdalia Dk,  April 11, 2010 8:45 AM  Follow-up for Phone Call        04/11/10--11am--pt states a nurse has already called about setting up a procedure and i gave her results of TSH BNP Follow-up by: Ledon Snare, RN,  April 11, 2010 10:38 AM

## 2010-12-13 NOTE — Progress Notes (Signed)
Summary: refill request for ambien  Phone Note Refill Request Message from:  Fax from Pharmacy  Refills Requested: Medication #1:  AMBIEN 10 MG TABS 1/2 to 1 at bedtime as needed insomnia   Last Refilled: 12/02/2009 Faxed request from [facility] [employer], [phone].  Initial call taken by: Lowella Petties CMA,  January 02, 2010 3:36 PM  Follow-up for Phone Call        Called to [facility]. Follow-up by: Lowella Petties CMA,  January 02, 2010 5:06 PM    Prescriptions: AMBIEN 10 MG TABS (ZOLPIDEM TARTRATE) 1/2 to 1 at bedtime as needed insomnia  #30 x 0   Entered and Authorized by:   Shaune Leeks MD   Signed by:   Shaune Leeks MD on 01/02/2010   Method used:   Telephoned to ...       [facility]* (retail)       [address]       [place], [place]  [address]       Ph: [phone]       Fax: [phone]   RxID:   [id number]  Please encourage pt tiotry not to use every nite. Shaune Leeks MD  January 02, 2010 4:41 PM

## 2010-12-13 NOTE — Letter (Signed)
Summary: [facility]  [facility]   Imported By: Lanelle Bal 11/13/2010 09:08:10  _____________________________________________________________________  External Attachment:    Type:   Image     Comment:   External Document  Appended Document: [facility]    Clinical Lists Changes  Observations: Added new observation of PAST SURG HX: Ganglion cyst per Dr. Teressa Senter 2011 C6-7 fusion per Dr. [name] 2011 (11/14/2010 21:55)       Past History:  Past Surgical History: Ganglion cyst per Dr. Teressa Senter 2011 C6-7 fusion per Dr. [name] 2011
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
## 2010-12-13 NOTE — Assessment & Plan Note (Signed)
Summary: Cardiology Nuclear Study  Nuclear Med Background Indications for Stress Test: Evaluation for Ischemia   History: Echo  History Comments: 02/09/10 Echo: EF= 45-50%, mild LVH  Symptoms: Chest Pain, Chest Pain with Exertion, Chest Tightness, Dizziness, DOE, Light-Headedness, Palpitations, SOB    Nuclear Pre-Procedure Caffeine/Decaff Intake: None NPO After: 9:00 PM Lungs: clear IV 0.9% NS with Angio Cath: 22g     IV Site: (R) AC IV Started by: Irean Hong RN Chest Size (in) 36     Cup Size C     Height (in): 67.5 Weight (lb): 173 BMI: 26.79 Tech Comments: This patient had PVCS, multifocal PVCS, and Vtach. S.Klein was consulted, then spoke with D.McLean about his findings. The patient was set up for a cardiac MRI, labs, follow up with D.McLean, and an appointment with S.Klein.  Nuclear Med Study 1 or 2 day study:  1 day     Stress Test Type:  Stress Reading MD:  Olga Millers, MD     Referring MD:  D.McLean Resting Radionuclide:  Technetium 47m Tetrofosmin     Resting Radionuclide Dose:  11 mCi  Stress Radionuclide:  Technetium 34m Tetrofosmin     Stress Radionuclide Dose:  33 mCi   Stress Protocol Exercise Time (min):  6:15 min     Max HR:  154 bpm Max Systolic BP: 170 mm Hg     METS: 7.30 Rate Pressure Product:  04540    Stress Test Technologist:  Milana Na EMT-P     Nuclear Technologist:  Burna Mortimer Deal RT-N  Rest Procedure  Myocardial perfusion imaging was performed at rest 45 minutes following the intravenous administration of Myoview Technetium 77m Tetrofosmin.  Stress Procedure  The patient exercised for 6:15.  The patient stopped due to fatigue, sob, and any chest tightness.  There were no significant ST-T wave changes, freq pvcs, multifocal pvcs, and Vtach.  Myoview was injected at peak exercise and myocardial perfusion imaging was performed after a brief delay.  QPS Raw Data Images:  There is no interference from other nuclear activity. Stress Images:   There is decreased uptake in the distal anterior Severe. Rest Images:  There is decreased uptake in the distal anterior Sosinski. Subtraction (SDS):  No evidence of ischemia. Transient Ischemic Dilatation:  1.01  (Normal <1.22)  Lung/Heart Ratio:  .37  (Normal <0.45)  Quantitative Gated Spect Images QGS EDV:  133 ml QGS ESV:  74 ml QGS EF:  45 % QGS cine images:  Mild global hypokinesis; evidence of left ventricular enlargement.   Overall Impression  Exercise Capacity: Fair exercise capacity. BP Response: Normal blood pressure response. Clinical Symptoms: There is chest pain ECG Impression: No significant ST segment change suggestive of ischemia; frequent PVCs and 22 beats nonsustained ventricular tachycardia. Overall Impression: Abnormal stress nuclear study with fixed anterior defect most likely due to soft tisse attenuation; no ischemia.  Appended Document: Cardiology Nuclear Study No evidence for ischemia.   Appended Document: Cardiology Nuclear Study LMVM   Appended Document: Cardiology Nuclear Study discussed results with pt by telephone

## 2010-12-13 NOTE — Progress Notes (Signed)
Summary: refill request for gabapentin  Phone Note Refill Request Message from:  Fax from Pharmacy  Refills Requested: Medication #1:  GABAPENTIN 300 MG CAPS 1 by mouth at bedtime   Last Refilled: 08/24/2010 Faxed request from [facility].  Initial call taken by: Lowella Petties CMA, AAMA,  September 07, 2010 12:46 PM  Follow-up for Phone Call        sent Follow-up by: Crawford Givens MD,  September 07, 2010 1:26 PM    New/Updated Medications: GABAPENTIN 300 MG CAPS (GABAPENTIN) 1 by mouth three times a day if pain persists Prescriptions: GABAPENTIN 300 MG CAPS (GABAPENTIN) 1 by mouth three times a day if pain persists  #90 x 2   Entered and Authorized by:   Crawford Givens MD   Signed by:   Crawford Givens MD on 09/07/2010   Method used:   Electronically to        [facility]* (retail)       [address]       [place], [place]  [address]       Ph: [phone]       Fax: [phone]   RxID:   [id number]

## 2010-12-13 NOTE — Assessment & Plan Note (Signed)
Summary: discuss vt/saf   Primary Provider:  Dr. Hetty Ely  CC:  discuss vt on myoview. .  History of Present Illness: 45 yo with history of anxiety and depression has been seen forevaluation of chest pain and exertional shortness of breath by Dr. Shirlee Latch.  She has undergone Myoview scanning which was negative, MRI scanning with delayed enhancement which was normal apart from mild depression in left ventricular systolic function.  Notably however on her exercise portion of her stress test she had left bundle inferior axis PVCs relatively infrequently but comprising about 5-10% of her beats. She then had a run of nonsustained ventricular tachycardia with a similar morphology of about 10-15 beats. This is near peak exercise. The test was characterized by shortness of breath but not suddenly. The recovery phase was notable for PVCs of 2 morphologies those from the right ventricular outflow tract and and a right bundle branch block beat  it appeared to emanate from the base of the left ventricle.  she has no palpitations.  Her major complaint are legion and include chest pain it has been almost constant x12 months, fatigue that has been progressive for 6-8 months, lightheadedness that has been present for 2-3 months that is not related to position gets worse with a day and with stress and has been worse over the last 2-3 weeks, blurry vision times months, headaches, numbness of her left hand, and this occurs in the context of a long-standing history of anxiety depression and interstitial cystitis  Current Medications (verified): 1)  Fluoxetine Hcl 20 Mg Caps (Fluoxetine Hcl) .... Take 1 Capsule By Mouth Once A Day 2)  Valtrex 500 Mg Tabs (Valacyclovir Hcl) .... Take 1 Tablet By Mouth Twice A Day 3)  Elmiron 100 Mg  Caps (Pentosan Polysulfate Sodium) .... As Directed 4)  Ambien 10 Mg Tabs (Zolpidem Tartrate) .... 1/2 To 1 At Bedtime As Needed Insomnia 5)  Buspar 15 Mg Tabs (Buspirone Hcl) .... One Tab  By Mouth Two Times A Day 6)  Ocella 3-0.03 Mg Tabs (Drospirenone-Ethinyl Estradiol) .... Take One By Mouth Daily 7)  Oscella .... Uad 8)  Coreg 6.25 Mg Tabs (Carvedilol) .... Take One Tablet Twice A Day  Allergies (verified): 1)  ! Biaxin  Past History:  Past Medical History: Last updated: 01/31/2010 1. Anxiety/Panic 2. Depression 3. Interstitial cystitis 4. Chest pain  Family History: Last updated: 01/31/2010 Maternal grandfather with CABG at 42. Parents healthy. Cousin with aortic dissection.   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   Vital Signs:  Patient profile:   45 year old female Height:      67.5 inches Weight:      182 pounds BMI:     28.19 Pulse rate:   72 / minute Pulse rhythm:   regular BP sitting:   143 / 93  (left arm) Cuff size:   regular  Vitals Entered By: Judithe Modest CMA (Mar 29, 2010 4:36 PM)  Physical Exam  General:  Well developed, well nourished, in no acute distress. Head:  normocephalic and atraumatic Neck:  Neck supple, no JVD. No masses, thyromegaly or abnormal cervical nodes. Chest Dail:  no CVA tenderness Lungs:  clear to Heart:  regular rate and rhythm without murmurs or gallops Abdomen:  soft with active bowel sounds without hepatomegaly midline Msk:  Back normal, normal gait. Muscle strength and tone normal. Pulses:  intact distal pulse Extremities:  no clubbing cyanosis or edema Neurologic:  grossly normal Skin:  warm and dry Cervical Nodes:  no adenopathy Psych:  normal affect   Exercise Stress Test  Procedure date:  02/28/2010  Findings:      the stress test was reviewed extensively. They're PVCs with a left bundle inferior axis morphology was transitioned V5  and a right bundle branch lock PVCs with a intermediate axis are monophasic weak concordant in the precordial leads  She also had nonsustained ventricular tachycardia of about 12 beats at a cycle length of about 300 ms also with a right bundle branch block inferior axis morphology  EKG  Procedure date:  02/28/2010  Findings:      Ic was reviewed and is normal for T waves are upright in the anterior precordium  Impression & Recommendations:  Problem # 1:  VENTRICULAR TACHYCARDIA (ICD-427.1) the patient has ventricular tachycardia and PVCs originating from the right ventricular outflow tract. there is no echocardiographic evidence or MRI evidence to suggest arrhythmogenic RV cardiomyopathy. 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 He has some degree of ventricular ectopy the burden of which is not entirely clear. In the context of her mild cardiopathy i.e. ejection fraction of 45-50%, and her symptoms of congestive heart failure it is reasonable to undertake a Holter monitor to quantitate the burden. In the event that she is more than 5-10% PVCs and empiric trial of flecainide might be reasonable to see if we can suppress the ventricular ectopy and then to see whether this translates into functional improvement as well as an increase in her ejection fraction. It is reasonable to use carvedilol I think given her mild LV dysfunction. I will also defer to Dr. Sherlie Ban and questions whether an ACE inhibitor might be of value Her updated medication list for this problem includes:    Coreg 6.25 Mg Tabs (Carvedilol) .Marland Kitchen... Take one tablet twice a day  Problem # 2:  CHEST DISCOMFORT (ICD-786.59) It is my impression that the PVCs unless they're much more prevalent and I think are not likely contributed to the chest discomfort or the shortness of breath. This however remains in part a diagnosis of exclusion and undertake a trial with an antiarrhythmic drug such as flecainide for suppression.  The fact that  she has had symptoms for so long in the context of her anxiety/depression has led to do suggest to her that careful attention should be at given to the role and the use of her psychotropic drugs and the possible role of counseling adjunctively for her overall quality of life Her updated medication list for this problem includes:    Coreg 6.25 Mg Tabs (Carvedilol) .Marland Kitchen... Take one tablet twice a day

## 2010-12-13 NOTE — Progress Notes (Signed)
Summary: vt on myoview  Phone Note From Other Clinic   Caller: [facility] Call For: Dr. Shirlee Latch Summary of Call: results of myoview  Follow-up for Phone Call        vt on myoview reviewed with Dr. Shirlee Latch.  He recommended coreg 6.25mg  twice a day.  Also  cardiac MRI looiking for delay entry scar.  As well as appointment with Dr. Graciela Husbands.  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)   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) New/Updated Medications: COREG 6.25 MG TABS (CARVEDILOL) take one tablet twice a day Prescriptions: COREG 6.25 MG TABS (CARVEDILOL) take one tablet twice a day  #60 x 6   Entered by:   Lisabeth Devoid RN   Authorized by:   Marca Ancona, MD   Signed by:   Lisabeth Devoid RN on 02/28/2010   Method used:   Electronically to        [facility]* (retail)       [address]       [place], [place]  [address]       Ph: [phone]       Fax: [phone]   RxID:   [id number]   Appended Document: vt on myoview    Clinical Lists Changes  Orders: Added new Referral order of Cardiac MRI (Cardiac MRI) - Signed Added new Referral order of EP Referral (Cardiology EP Ref ) - Signed      Appended Document: vt on myoview VT with exertion on treadmill.  Run of NSVT was LBBB-type.  He had RBBB-type and LBBB-type PVCs.  She gets shortness of breath/palpitations worse with exertion but sometimes also at rest (usually under stress).  ? catecholeaminergic responsive VT (? RVOT tachy, but would not expect RBBB-type PVCs).  Will get cardiac MRI to assess for scar, ARVC and will start Coreg 6.25 mg two times a day given NSVT and mildly decreased LV systolic function.  Await final read of myoview.  Will refer to EP for evaluation.

## 2010-12-13 NOTE — Assessment & Plan Note (Signed)
Summary: TRANSFER FROM DR SCHALLER/CYST ON L   Vital Signs:  Patient profile:   45 year old female Height:      67.5 inches Weight:      184.50 pounds BMI:     28.57 Temp:     98.6 degrees F oral Pulse rate:   84 / minute Pulse rhythm:   regular BP sitting:   102 / 70  (left arm) Cuff size:   regular  Vitals Entered By: Delilah Shan CMA Eldridge Marcott Dull) (June 05, 2010 3:38 PM) CC: 1. Cyst on leg.   2.  ? UTI   History of Present Illness: dysuria: with odor and pain, frequency duration of symptoms: 4 days abdominal pain:yes fevers:no back pain: occ lower back pain vomiting: no H/o IC and no relief with elmiron.   "Cyst" on hand.  Occ tingling in L arm.  also with tingling in L arm.  Area is tender.  Wax and wane of size.  Overall, increase in size recently.    Allergies: 1)  ! Biaxin  Review of Systems       See HPI.  Otherwise negative.    Physical Exam  General:  GEN: nad, alert and oriented HEENT: mucous membranes moist NECK: supple CV: rrr.  PULM: ctab, no inc wob ABD: soft, +bs, suprapubic area tender EXT: no edema, but ganglion cyst noted on L wrist SKIN: no acute rash BACK: no CVA pain    Impression & Recommendations:  Problem # 1:  UTI (ICD-599.0)  Check cx and start septra.  d/w patient re: prevention methods.  Can use vicodin for now for pain.   Her updated medication list for this problem includes:    Septra Ds 800-160 Mg Tabs (Sulfamethoxazole-trimethoprim) .Marland Kitchen... 1 by mouth two times a day for 3 days  Orders: T-Culture, Urine ([id number]) Specimen Handling (09811)  Problem # 2:  GANGLION CYST, WRIST, LEFT (ICD-727.41)  Refer to hand center.  App help in treatment.  I did not aspirate cyst.    Orders: Misc. Referral (Misc. Ref)  Complete Medication List: 1)  Fluoxetine Hcl 20 Mg Caps (Fluoxetine hcl) .... Take 1 capsule by mouth once a day 2)  Valtrex 500 Mg Tabs (Valacyclovir hcl) .... Take 1 tablet by mouth twice a day 3)  Ambien 10 Mg Tabs  (Zolpidem tartrate) .... 1/2 to 1 at bedtime as needed insomnia 4)  Buspar 15 Mg Tabs (Buspirone hcl) .... One tab by mouth two times a day 5)  Ocella 3-0.03 Mg Tabs (Drospirenone-ethinyl estradiol) .... Take one by mouth daily 6)  Oscella  .... Uad 7)  Toprol Xl 25 Mg Xr24h-tab (Metoprolol succinate) .... One twice a day 8)  Septra Ds 800-160 Mg Tabs (Sulfamethoxazole-trimethoprim) .Marland Kitchen.. 1 by mouth two times a day for 3 days 9)  Vicodin 5-500 Mg Tabs (Hydrocodone-acetaminophen) .Marland Kitchen.. 1 by mouth three times a day as needed pain, sedation caution  Patient Instructions: 1)  Let me know if you are not getting relief.   2)  Please schedule a follow-up appointment as needed .  Prescriptions: VICODIN 5-500 MG TABS (HYDROCODONE-ACETAMINOPHEN) 1 by mouth three times a day as needed pain, sedation caution  #15 x 0   Entered and Authorized by:   Crawford Givens MD   Signed by:   Crawford Givens MD on 06/05/2010   Method used:   Print then Give to Patient   RxID:   [id number] SEPTRA DS 800-160 MG TABS (SULFAMETHOXAZOLE-TRIMETHOPRIM) 1 by mouth two times a day for 3  days  #6 x 0   Entered and Authorized by:   Crawford Givens MD   Signed by:   Crawford Givens MD on 06/05/2010   Method used:   Print then Give to Patient   RxID:   [id number]   Current Allergies (reviewed today): ! [facility]  Laboratory Results   Urine Tests  Date/Time Received: June 05, 2010 3:53 PM   Routine Urinalysis   Color: lt. yellow Appearance: Hazy Glucose: negative   (Normal Range: Negative) Bilirubin: negative   (Normal Range: Negative) Ketone: negative   (Normal Range: Negative) Spec. Gravity: >=1.030   (Normal Range: 1.003-1.035) Blood: small   (Normal Range: Negative) pH: 6.0   (Normal Range: 5.0-8.0) Protein: trace   (Normal Range: Negative) Urobilinogen: 0.2   (Normal Range: 0-1) Nitrite: negative   (Normal Range: Negative) Leukocyte Esterace: moderate   (Normal Range: Negative)
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
## 2010-12-13 NOTE — Letter (Signed)
Summary: Nadara Eaton letter  [place] [facility]  [address] [place], [place] [address]   Phone: [phone]  Fax: [phone]       06/13/2010 MRN: [id number]  [facility] [address] [place], [place]  [address]  Dear Ms. [facility], and [facility] announce the retirement of Arta Silence, M.D., from full-time practice at the [facility] office effective May 10, 2010 and his plans of returning part-time.  It is important to Dr. Hetty Ely and to our practice that you understand that [facility] has seven physicians in our office for your health care needs.  We will continue to offer the same exceptional care that you have today.    Dr. Hetty Ely has spoken to many of you about his plans for retirement and returning part-time in the fall.   We will continue to work with you through the transition to schedule appointments for you in the office and meet the high standards that [facility] is committed to.   Again, it is with great pleasure that we share the news that Dr. Hetty Ely will return to [facility] in October of 2011 with a reduced schedule.    If you have any questions, or would like to request an appointment with one of our physicians, please call us at [phone] and press the option for Scheduling an appointment.  We take pleasure in providing you with excellent patient care and look forward to seeing you at your next office visit.  Our [facility] Physicians are:  Tillman Abide, M.D. Laurita Quint, M.D. Roxy Manns, M.D. Kerby Nora, M.D. Hannah Beat, M.D. Ruthe Mannan, M.D. We proudly welcomed Raechel Ache, M.D. and Eustaquio Boyden, M.D. to the practice in July/August 2011.  Sincerely,  [facility]

## 2010-12-13 NOTE — Progress Notes (Signed)
Summary: UTI  is back  Phone Note Call from Patient Call back at Home Phone [phone]   Caller: Patient Summary of Call: Pt was told to call back if her UTI didnt get better.  She said if did get better the first week with medicine but sxs have been back for the past  2 days- frequency, pain with urination.  She thinks she needs another round of abx.   [facility]. Initial call taken by: Lowella Petties CMA,  June 19, 2010 11:01 AM  Follow-up for Phone Call       Follow-up by: Crawford Givens MD,  June 19, 2010 1:37 PM    New/Updated Medications: SEPTRA DS 800-160 MG TABS (SULFAMETHOXAZOLE-TRIMETHOPRIM) 1 by mouth two times a day for 5 days Prescriptions: SEPTRA DS 800-160 MG TABS (SULFAMETHOXAZOLE-TRIMETHOPRIM) 1 by mouth two times a day for 5 days  #10 x 0   Entered and Authorized by:   Crawford Givens MD   Signed by:   Crawford Givens MD on 06/19/2010   Method used:   Electronically to        [facility]* (retail)       [address]       [place], [place]  [address]       Ph: [phone]       Fax: [phone]   RxID:   [id number] SEPTRA DS 800-160 MG TABS (SULFAMETHOXAZOLE-TRIMETHOPRIM) 1 by mouth two times a day for 3 days  #10 x 0   Entered and Authorized by:   Crawford Givens MD   Signed by:   Crawford Givens MD on 06/19/2010   Method used:   Electronically to        [facility]* (retail)       [address]       [place], [place]  [address]       Ph: [phone]       Fax: [phone]   RxID:   [id number]
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
## 2010-12-13 NOTE — Progress Notes (Signed)
Summary: wants more antibiotic  Phone Note Call from Patient Call back at Home Phone [phone]   Caller: Patient Call For: Dr. Para March Summary of Call: Pt was treated for strep last month and she says it is back.  She is asking for another round of abx to be called to [place].  Her throat is very sore with pus pockets, same symptoms as last time.  She said that you told her she may need 2 rounds of abx to get rid of it.  Pt called back to add that she has to be in court for the next 2 days and would like to get started on something today. Initial call taken by: Lowella Petties CMA, AAMA,  November 19, 2010 10:56 AM  Follow-up for Phone Call        rx done.  follow up if not improved.  Follow-up by: Crawford Givens MD,  November 19, 2010 1:46 PM  Additional Follow-up for Phone Call Additional follow up Details #1::        Patient Advised.  Additional Follow-up by: Delilah Shan CMA Kyriana Yankee Dull),  November 19, 2010 2:23 PM    Prescriptions: AMOXICILLIN 875 MG TABS (AMOXICILLIN) 1 by mouth two times a day  #20 x 0   Entered and Authorized by:   Crawford Givens MD   Signed by:   Crawford Givens MD on 11/19/2010   Method used:   Electronically to        [facility]* (retail)       [address]       [place], [place]  [address]       Ph: [phone]       Fax: [phone]   RxID:   [id number]

## 2010-12-13 NOTE — Letter (Signed)
Summary: Appointment - Cardiac MRI  [facility]     [place], [place]    Phone:   Fax:       March 06, 2010 MRN: [id number]   [facility] [address] [place], [place]  [address]   Dear Ms. Allison Quarry,   We have scheduled the above patient for an appointment for a Cardiac MRI on May 3,2011  at 8:00a.m.  Please refer to the below information for the location and instructions for this test:  Location:     [facility]       [address]       [place], [place]  [address] Instructions:    Wil[facility] Arms at [facility] Registration 45 minutes prior to your appointment time.  This will ensure you are in the Radiology Department 30 minutes prior to your appointment.    There are no restrictions for this test you may eat and take medications as usual.  If you need to reschedule this appointment please call at the number listed above.  Sincerely,      Lorne Skeens  [facility] Scheduling Team

## 2010-12-13 NOTE — Procedures (Signed)
Summary: summary report  summary report   Imported By: Mirna Mires 04/26/2010 09:35:44  _____________________________________________________________________  External Attachment:    Type:   Image     Comment:   External Document

## 2010-12-13 NOTE — Progress Notes (Signed)
Summary: ? Stress test  Phone Note Call from Patient Call back at Home Phone [phone]   Caller: Patient Call For: Shaune Leeks MD Summary of Call: Patient wants to know if she can have a stress test ordered.  Was seen in urgent care and was told she has an extra heart beat.  Was told in urgent care that she needs a stress test.  In no acute distress, no chest pains but would feel better if she had the stress test done. Initial call taken by: Linde Gillis CMA Duncan Dull),  January 22, 2010 2:27 PM  Follow-up for Phone Call        Have her come in to be seen and to discuss. Follow-up by: Shaune Leeks MD,  January 22, 2010 4:46 PM  Additional Follow-up for Phone Call Additional follow up Details #1::        Patient scheduled appt for 01/25/2010 at 3:30. Additional Follow-up by: Linde Gillis CMA Duncan Dull),  January 22, 2010 4:54 PM

## 2010-12-13 NOTE — Progress Notes (Signed)
Summary: regarding ER follow up  ---- Converted from flag ---- ---- 08/13/2010 1:44 PM, Lowella Petties CMA wrote: Advised pt's mother, she was ageeable.  ---- 08/13/2010 1:44 PM, Crawford Givens MD wrote: Pt was seen for chest Hauck pain and per ER records was advised 3 day follow up.  I would agree with that and have patient seen tomorrow.  If she has significant worsening in meantime, she needs to be seen at ER or UC.  Please convert this to a phone note after the patient is contacted.   ---- 08/13/2010 12:26 PM, Lowella Petties CMA wrote: Pt was seen in ER yesterday for chest pain.  They found nothing wrong but told her that if she still had pain today that she needs to see PCP today.  She has appt tomorrow but her mother is very persistent that she be seen today.  She is having chest and back pain, pain in  hands.  She has percocet to take.  Mother wants you to stay late today so that she can be seen.  Please advise.  [phone], [phone] ------------------------------

## 2010-12-13 NOTE — Progress Notes (Signed)
Summary: Nuclear Pre-Procedure  Phone Note Outgoing Call Call back at [facility] Phone [phone]   Call placed by: Stanton Kidney, EMT-P,  February 27, 2010 1:48 PM Action Taken: Phone Call Completed Summary of Call: Left message with information on [employer] Information Sheet (see scanned document for details).     Nuclear Med Background Indications for Stress Test: Evaluation for Ischemia   History: Echo  History Comments: 02/09/10 Echo: EF= 45-50%, mild LVH  Symptoms: Chest Pain, Chest Pain with Exertion, DOE    Nuclear Pre-Procedure Height (in): 67.5

## 2010-12-18 ENCOUNTER — Telehealth: Payer: Self-pay | Admitting: Family Medicine

## 2010-12-27 NOTE — Progress Notes (Signed)
Summary: refill request for gabapentin  Phone Note Refill Request Message from:  Fax from Pharmacy  Refills Requested: Medication #1:  gabapentin 300 mg   Last Refilled: 11/09/2010 Faxed request from [facility], this is no longer on med list.  Initial call taken by: Lowella Petties CMA, AAMA,  December 18, 2010 8:07 AM  Follow-up for Phone Call        please clarify with patient.  I thought she stopped this after her surgery because she was improved. Crawford Givens MD  December 18, 2010 12:21 PM.   Patient said she did not request this refill and does not need it.  Pharmacy advised.  Lugene Fuquay CMA (AAMA)  December 18, 2010 2:52 PM

## 2011-01-24 LAB — DIFFERENTIAL
Basophils Absolute: 0.1 10*3/uL (ref 0.0–0.1)
Basophils Relative: 1 % (ref 0–1)
Eosinophils Absolute: 0.1 10*3/uL (ref 0.0–0.7)
Eosinophils Relative: 1 % (ref 0–5)
Lymphocytes Relative: 17 % (ref 12–46)
Lymphs Abs: 1.9 10*3/uL (ref 0.7–4.0)
Monocytes Absolute: 0.7 10*3/uL (ref 0.1–1.0)
Monocytes Relative: 6 % (ref 3–12)
Neutro Abs: 8.4 10*3/uL — ABNORMAL HIGH (ref 1.7–7.7)
Neutrophils Relative %: 75 % (ref 43–77)

## 2011-01-24 LAB — BASIC METABOLIC PANEL
BUN: 15 mg/dL (ref 6–23)
CO2: 28 mEq/L (ref 19–32)
Chloride: 105 mEq/L (ref 96–112)
Glucose, Bld: 101 mg/dL — ABNORMAL HIGH (ref 70–99)
Potassium: 4.4 mEq/L (ref 3.5–5.1)

## 2011-01-24 LAB — URINALYSIS, ROUTINE W REFLEX MICROSCOPIC
Bilirubin Urine: NEGATIVE
Glucose, UA: NEGATIVE mg/dL
Ketones, ur: NEGATIVE mg/dL
Ketones, ur: NEGATIVE mg/dL
Nitrite: NEGATIVE
Protein, ur: NEGATIVE mg/dL
Protein, ur: NEGATIVE mg/dL
Urobilinogen, UA: 0.2 mg/dL (ref 0.0–1.0)
Urobilinogen, UA: 1 mg/dL (ref 0.0–1.0)

## 2011-01-24 LAB — SURGICAL PCR SCREEN
MRSA, PCR: NEGATIVE
Staphylococcus aureus: NEGATIVE

## 2011-01-24 LAB — CK TOTAL AND CKMB (NOT AT ARMC)
CK, MB: 1.1 ng/mL (ref 0.3–4.0)
CK, MB: 1.2 ng/mL (ref 0.3–4.0)
Relative Index: INVALID (ref 0.0–2.5)
Total CK: 117 U/L (ref 7–177)
Total CK: 99 U/L (ref 7–177)

## 2011-01-24 LAB — CBC
HCT: 35.5 % — ABNORMAL LOW (ref 36.0–46.0)
HCT: 37.5 % (ref 36.0–46.0)
Hemoglobin: 12.4 g/dL (ref 12.0–15.0)
Hemoglobin: 12.8 g/dL (ref 12.0–15.0)
MCH: 33.2 pg (ref 26.0–34.0)
MCH: 34.1 pg — ABNORMAL HIGH (ref 26.0–34.0)
MCHC: 34.1 g/dL (ref 30.0–36.0)
MCHC: 34.9 g/dL (ref 30.0–36.0)
MCV: 97.2 fL (ref 78.0–100.0)
MCV: 97.5 fL (ref 78.0–100.0)
Platelets: 242 10*3/uL (ref 150–400)
Platelets: 272 10*3/uL (ref 150–400)
RBC: 3.64 MIL/uL — ABNORMAL LOW (ref 3.87–5.11)
RBC: 3.86 MIL/uL — ABNORMAL LOW (ref 3.87–5.11)
RDW: 12.3 % (ref 11.5–15.5)
RDW: 12.3 % (ref 11.5–15.5)
WBC: 11.1 10*3/uL — ABNORMAL HIGH (ref 4.0–10.5)
WBC: 12.9 10*3/uL — ABNORMAL HIGH (ref 4.0–10.5)

## 2011-01-24 LAB — BASIC METABOLIC PANEL WITH GFR
BUN: 13 mg/dL (ref 6–23)
CO2: 25 meq/L (ref 19–32)
Calcium: 9 mg/dL (ref 8.4–10.5)
Chloride: 104 meq/L (ref 96–112)
Creatinine, Ser: 0.85 mg/dL (ref 0.4–1.2)
GFR calc non Af Amer: >60 mL/min
Glucose, Bld: 95 mg/dL (ref 70–99)
Potassium: 4.4 meq/L (ref 3.5–5.1)
Sodium: 137 meq/L (ref 135–145)

## 2011-01-24 LAB — URINE MICROSCOPIC-ADD ON

## 2011-01-24 LAB — PROTIME-INR
INR: 0.88 (ref 0.00–1.49)
Prothrombin Time: 12.1 s (ref 11.6–15.2)

## 2011-01-24 LAB — D-DIMER, QUANTITATIVE

## 2011-01-24 LAB — TROPONIN I: Troponin I: 0.01 ng/mL (ref 0.00–0.06)

## 2011-01-24 LAB — APTT: aPTT: 20 s — ABNORMAL LOW (ref 24–37)

## 2011-01-25 LAB — BASIC METABOLIC PANEL
BUN: 11 mg/dL (ref 6–23)
CO2: 27 mEq/L (ref 19–32)
Creatinine, Ser: 0.86 mg/dL (ref 0.4–1.2)
GFR calc Af Amer: >60 mL/min (ref >60)
Sodium: 138 mEq/L (ref 135–145)

## 2011-02-21 ENCOUNTER — Other Ambulatory Visit: Payer: Self-pay | Admitting: *Deleted

## 2011-02-21 MED ORDER — VALACYCLOVIR HCL 500 MG PO TABS: 500.0000 mg | ORAL_TABLET | Freq: Two times a day (BID) | ORAL | Status: DC

## 2011-03-13 ENCOUNTER — Other Ambulatory Visit: Payer: Self-pay | Admitting: *Deleted

## 2011-03-13 MED ORDER — ZOLPIDEM TARTRATE 10 MG PO TABS: ORAL_TABLET | ORAL | Status: DC

## 2011-03-13 NOTE — Telephone Encounter (Signed)
I haven't seen this pt since early 2011. [facility] says transfer to you in July but chart never switched. This is a very compliant lady with significant back pain issues, complicated from [facility] surgery. I think she considers you her doctor now.

## 2011-03-13 NOTE — Telephone Encounter (Signed)
Please call in and change her PCP to me from [place].

## 2011-03-14 NOTE — Telephone Encounter (Signed)
Medication phoned to pharmacy.  

## 2011-03-26 NOTE — Op Note (Signed)
NAMESUNG, RENTON                ACCOUNT NO.:  [id number]   MEDICAL RECORD NO.:  [id number]          PATIENT TYPE:  AMB   LOCATION:  [facility]                         FACILITY:  [facility]   PHYSICIAN:  Sigmund I. Patsi Sears, M.D.DATE OF BIRTH:  1966/05/16   DATE OF PROCEDURE:  08/21/2007  DATE OF DISCHARGE:                               OPERATIVE REPORT   PREOPERATIVE DIAGNOSIS:  Interstitial cystitis.   PREOPERATIVE DIAGNOSIS:  Interstitial cystitis.   OPERATION:  Cystourethroscopy, HOG, biopsy of the bladder, cauterization  of the biopsy sites, instillation of Marcaine and Pyridium in the  bladder, injection of Marcaine and Kenalog in the subtrigonal space.   SURGEON:  Sigmund I. Patsi Sears, M.D.   ANESTHESIA:  General LMA.   PREPARATION:  After appropriate preanesthesia, the patient is brought to  the operating room and placed on the operating room table in a dorsal  supine position where general LMA anesthesia was introduced.  She was  then placed in dorsal lithotomy position, where the pubis was prepped  with Betadine solution and draped in the usual fashion.   Reviewed history.  This 45 year old female has a history of chronic  pelvic pain, urinary frequency and urgency, and history of interstitial  cystitis.  Her last cysto/HOG was 2001.  She complains of frequency,  difficulty postponing urination with nocturia, and painful intercourse.  She has bladder-based pain on pelvic examination.  She has an IC central  score sheet of 23.  Urodynamics show a hypersensitive bladder with mild  instability and a poorly sustained weak detrusor contraction.   PROCEDURE:  Cystourethroscopy was accomplished.  It shows normal-  appearing urethra, normal-appearing bladder. The bladder holds 1000 cc.  Cold-cup bladder biopsies are obtained.  The biopsy sites are  cauterized.  Following this, instillation of Pyridium/Marcaine solution  is accomplished in the bladder and injection of  Marcaine/Kenalog  solution is accomplished in the subtrigonal space.  Patient had B&O  suppositories in the beginning of her procedure and IV Toradol at the  end of her procedure.  She was also covered with IV antibiotics.  She  was awakened and taken to the recovery room in good condition.      Sigmund I. Patsi Sears, M.D.  Electronically Signed     SIT/MEDQ  D:  08/21/2007  T:  08/21/2007  Job:  [id number]

## 2011-04-02 ENCOUNTER — Encounter: Payer: Self-pay | Admitting: Family Medicine

## 2011-04-03 ENCOUNTER — Ambulatory Visit (INDEPENDENT_AMBULATORY_CARE_PROVIDER_SITE_OTHER): Payer: BC Managed Care – PPO | Admitting: Family Medicine

## 2011-04-03 ENCOUNTER — Encounter: Payer: Self-pay | Admitting: Family Medicine

## 2011-04-03 VITALS — BP 120/78 | HR 70 | Temp 98.3°F | Ht 67.5 in | Wt 207.4 lb

## 2011-04-03 DIAGNOSIS — R51 Headache: Secondary | ICD-10-CM

## 2011-04-03 MED ORDER — BUTALBITAL-ASA-CAFFEINE 50-325-40 MG PO CAPS: 1.0000 | ORAL_CAPSULE | Freq: Two times a day (BID) | ORAL | Status: AC | PRN

## 2011-04-03 NOTE — Assessment & Plan Note (Addendum)
Likely tension, poss related to changes at work and home.  Not a typical migraine history.  D/w pt about stress mgmt and using the fiorinal as a prn med.  If needed frequently, she'll notify me.  Okay for outpatient fu and no indication for imaging.  She agrees with plan.

## 2011-04-03 NOTE — Progress Notes (Signed)
Headaches.  On and off for 6 months.  She's trying to keep track of the pattern.  Worse under periods of stress (work, daughter moving to college).  No photophobia.  Throbs.  Prev used OTC meds w/o relief.  She took fiorinal with some relief, from her mother.  She's off caffeine o/w now after using excedrin migraine.    No aura with the HA.  No nausea.  No phonphobia.  Tight, throbbing sensation.  She is able to sleep some of them off.  Less HA frequency on the weekend, away from work.  She had vision checked and was okay there.  Now 3-4 HA per week.    Work situation should improve in the near future.    Meds, vitals, and allergies reviewed.   ROS: See HPI.  Otherwise, noncontributory.  GEN: nad, alert and oriented HEENT: mucous membranes moist NECK: supple w/o LA CV: rrr.  no murmur PULM: ctab, no inc wob EXT: no edema SKIN: no acute rash CN 2-12 wnl B, S/S/DTR wnl x4

## 2011-04-03 NOTE — Patient Instructions (Signed)
Take the fiorinal as needed and if you continue to have frequent headaches after your work changes, then let me know.  Take care.

## 2011-05-02 ENCOUNTER — Telehealth: Payer: Self-pay | Admitting: *Deleted

## 2011-05-02 MED ORDER — POLYMYXIN B-TRIMETHOPRIM 10000-0.1 UNIT/ML-% OP SOLN: OPHTHALMIC | Status: DC

## 2011-05-02 NOTE — Telephone Encounter (Signed)
Patient says that her daughter was diagnosed with pink eye and now she has it. She is asking if she can get eye drops called in. She says that she doesn't have money to come in for office visit. Uses [facility]. Please advise.

## 2011-05-02 NOTE — Telephone Encounter (Signed)
Advised patient

## 2011-05-02 NOTE — Telephone Encounter (Signed)
rx sent.  If she has progressive symptoms or vision change, then she needs eval.  Please notify pt.

## 2011-07-18 ENCOUNTER — Other Ambulatory Visit: Payer: Self-pay

## 2011-07-18 MED ORDER — METOPROLOL SUCCINATE ER 25 MG PO TB24: 25.0000 mg | ORAL_TABLET | Freq: Two times a day (BID) | ORAL | Status: DC

## 2011-07-18 NOTE — Telephone Encounter (Signed)
This is okay to fill through here. I sent the rx.  Thanks.  If she still was supposed to have f/u with cards, then keep it.

## 2011-07-18 NOTE — Telephone Encounter (Signed)
Patient notified as instructed by telephone. Pt said she had already had f/u with cardiologist and cardiologist had released pt back to PCP.

## 2011-07-18 NOTE — Telephone Encounter (Signed)
[facility] faxed refill request for Metoprolol succinate 25 mg TER taking 1 tab by mouth two times a day. Pt previously had been getting filled at Dr Freida Busman [facility]'s office. Rob at [place] said pt wanted transferred to Dr Para March.Please advise.

## 2011-07-26 ENCOUNTER — Other Ambulatory Visit: Payer: Self-pay | Admitting: *Deleted

## 2011-07-26 MED ORDER — BUTALBITAL-ASA-CAFFEINE 50-325-40 MG PO CAPS: 1.0000 | ORAL_CAPSULE | Freq: Two times a day (BID) | ORAL | Status: AC | PRN

## 2011-07-26 NOTE — Telephone Encounter (Signed)
Ok to refill 

## 2011-07-26 NOTE — Telephone Encounter (Signed)
Medication phoned to pharmacy.  

## 2011-08-22 ENCOUNTER — Other Ambulatory Visit: Payer: Self-pay

## 2011-08-22 LAB — POCT PREGNANCY, URINE
Operator id: 268271
Preg Test, Ur: NEGATIVE

## 2011-08-22 NOTE — Telephone Encounter (Signed)
[facility] faxed refill request fluoxetine 20 mg. Last filled 07/17/11 and Dr Para March last saw pt 04/03/11. Please advise.

## 2011-08-23 MED ORDER — FLUOXETINE HCL 20 MG PO TABS: 20.0000 mg | ORAL_TABLET | Freq: Every day | ORAL | Status: DC

## 2011-08-23 NOTE — Telephone Encounter (Signed)
rx sent

## 2011-10-08 ENCOUNTER — Other Ambulatory Visit: Payer: Self-pay | Admitting: *Deleted

## 2011-10-08 MED ORDER — ZOLPIDEM TARTRATE 10 MG PO TABS: ORAL_TABLET | ORAL | Status: DC

## 2011-10-08 NOTE — Telephone Encounter (Signed)
Please call in

## 2011-10-08 NOTE — Telephone Encounter (Signed)
Medication phoned to pharmacy.  

## 2011-11-04 DIAGNOSIS — S92911A Unspecified fracture of right toe(s), initial encounter for closed fracture: Secondary | ICD-10-CM

## 2011-11-04 HISTORY — DX: Unspecified fracture of right toe(s), initial encounter for closed fracture: S92.911A

## 2011-11-06 ENCOUNTER — Ambulatory Visit (INDEPENDENT_AMBULATORY_CARE_PROVIDER_SITE_OTHER): Payer: BC Managed Care – PPO | Admitting: Family Medicine

## 2011-11-06 ENCOUNTER — Ambulatory Visit (INDEPENDENT_AMBULATORY_CARE_PROVIDER_SITE_OTHER)
Admission: RE | Admit: 2011-11-06 | Discharge: 2011-11-06 | Disposition: A | Discharge status: Home / Self Care | Payer: BC Managed Care – PPO | Source: Ambulatory Visit | Attending: Family Medicine | Admitting: Family Medicine

## 2011-11-06 ENCOUNTER — Encounter: Payer: Self-pay | Admitting: Family Medicine

## 2011-11-06 DIAGNOSIS — M79609 Pain in unspecified limb: Secondary | ICD-10-CM

## 2011-11-06 DIAGNOSIS — M79671 Pain in right foot: Secondary | ICD-10-CM

## 2011-11-06 MED ORDER — HYDROCODONE-ACETAMINOPHEN 5-500 MG PO TABS: 1.0000 | ORAL_TABLET | Freq: Three times a day (TID) | ORAL | Status: AC | PRN

## 2011-11-06 NOTE — Patient Instructions (Signed)
Use heat and ice as discussed. Take Vicodin sparingly. Immobilize foot as much as possible.

## 2011-11-06 NOTE — Progress Notes (Signed)
  Subjective:    Patient ID: Tricia [name], female    DOB: [birth year], 45 y.o.   MRN: [id number]  HPI Pt of Dr Lianne Bushy here for foot pain. She fell down the stairs at her home Christmas Eve night trying to carry presents down for her daughter in the dark and fell with her foot catching underneath her. She has had pain since with swelling and echymosis of her right middle toe and swelling and faint echymosis of her right forefoot. She iced it once yesterday. It is difficult to walk due to pain.    Review of SystemsNoncontributory except as above.       Objective:   Physical Exam  Musculoskeletal:       Right foot: She exhibits decreased range of motion, tenderness, bony tenderness and swelling.       Feet:   Swelling and mild echymosis of the right dorsal forefoot and swelling and significant echymosis of the right index toe.       Assessment & Plan:

## 2011-11-06 NOTE — Assessment & Plan Note (Addendum)
Xray today to assess for fracture. Xrays read as probable mid phalangeal fractures of the second and third toes. Would agree with second toe from the clinical presentation but have a hard time believing the middle toe as well as it looked reasonably normal. Ace to  Foot to stabilize the soft tissue structures.  IBP prn with food.  Vicodin as needed for pain. Try to limit this to nitetime. Toes will heal, foot will probably take longer to get back to normal.

## 2011-11-20 ENCOUNTER — Other Ambulatory Visit: Payer: Self-pay | Admitting: Urology

## 2011-11-27 ENCOUNTER — Encounter (HOSPITAL_BASED_OUTPATIENT_CLINIC_OR_DEPARTMENT_OTHER): Payer: Self-pay | Admitting: *Deleted

## 2011-11-27 NOTE — Progress Notes (Signed)
To [facility] at 1000.Istat,Ekg on arrival.Npo after mn-to take metoprolol w/sip water that am.

## 2011-11-29 ENCOUNTER — Encounter (HOSPITAL_BASED_OUTPATIENT_CLINIC_OR_DEPARTMENT_OTHER)
Admission: RE | Disposition: A | Discharge status: Home / Self Care | Payer: Self-pay | Source: Ambulatory Visit | Attending: Urology

## 2011-11-29 ENCOUNTER — Ambulatory Visit (HOSPITAL_BASED_OUTPATIENT_CLINIC_OR_DEPARTMENT_OTHER)
Admission: RE | Admit: 2011-11-29 | Discharge: 2011-11-29 | Disposition: A | Discharge status: Home / Self Care | Payer: BC Managed Care – PPO | Source: Ambulatory Visit | Attending: Urology | Admitting: Urology

## 2011-11-29 ENCOUNTER — Encounter (HOSPITAL_BASED_OUTPATIENT_CLINIC_OR_DEPARTMENT_OTHER): Payer: Self-pay | Admitting: Anesthesiology

## 2011-11-29 ENCOUNTER — Other Ambulatory Visit: Payer: Self-pay

## 2011-11-29 ENCOUNTER — Encounter (HOSPITAL_BASED_OUTPATIENT_CLINIC_OR_DEPARTMENT_OTHER): Payer: Self-pay | Admitting: *Deleted

## 2011-11-29 ENCOUNTER — Ambulatory Visit (HOSPITAL_BASED_OUTPATIENT_CLINIC_OR_DEPARTMENT_OTHER): Payer: BC Managed Care – PPO | Admitting: Anesthesiology

## 2011-11-29 DIAGNOSIS — M79609 Pain in unspecified limb: Secondary | ICD-10-CM

## 2011-11-29 DIAGNOSIS — Z79899 Other long term (current) drug therapy: Secondary | ICD-10-CM | POA: Insufficient documentation

## 2011-11-29 DIAGNOSIS — J309 Allergic rhinitis, unspecified: Secondary | ICD-10-CM

## 2011-11-29 DIAGNOSIS — F3289 Other specified depressive episodes: Secondary | ICD-10-CM

## 2011-11-29 DIAGNOSIS — R05 Cough: Secondary | ICD-10-CM

## 2011-11-29 DIAGNOSIS — I4729 Other ventricular tachycardia: Secondary | ICD-10-CM

## 2011-11-29 DIAGNOSIS — R51 Headache: Secondary | ICD-10-CM

## 2011-11-29 DIAGNOSIS — I472 Ventricular tachycardia: Secondary | ICD-10-CM

## 2011-11-29 DIAGNOSIS — N390 Urinary tract infection, site not specified: Secondary | ICD-10-CM

## 2011-11-29 DIAGNOSIS — R0609 Other forms of dyspnea: Secondary | ICD-10-CM

## 2011-11-29 DIAGNOSIS — F329 Major depressive disorder, single episode, unspecified: Secondary | ICD-10-CM

## 2011-11-29 DIAGNOSIS — G4700 Insomnia, unspecified: Secondary | ICD-10-CM

## 2011-11-29 DIAGNOSIS — I428 Other cardiomyopathies: Secondary | ICD-10-CM

## 2011-11-29 DIAGNOSIS — M79671 Pain in right foot: Secondary | ICD-10-CM

## 2011-11-29 DIAGNOSIS — N3010 Interstitial cystitis (chronic) without hematuria: Secondary | ICD-10-CM

## 2011-11-29 DIAGNOSIS — R0989 Other specified symptoms and signs involving the circulatory and respiratory systems: Secondary | ICD-10-CM

## 2011-11-29 DIAGNOSIS — F411 Generalized anxiety disorder: Secondary | ICD-10-CM

## 2011-11-29 DIAGNOSIS — R059 Cough, unspecified: Secondary | ICD-10-CM

## 2011-11-29 DIAGNOSIS — M6740 Ganglion, unspecified site: Secondary | ICD-10-CM

## 2011-11-29 DIAGNOSIS — R3915 Urgency of urination: Secondary | ICD-10-CM | POA: Insufficient documentation

## 2011-11-29 DIAGNOSIS — R350 Frequency of micturition: Secondary | ICD-10-CM | POA: Insufficient documentation

## 2011-11-29 HISTORY — DX: Ventricular premature depolarization: I49.3

## 2011-11-29 HISTORY — DX: Unspecified fracture of right toe(s), initial encounter for closed fracture: S92.911A

## 2011-11-29 HISTORY — PX: CYSTO WITH HYDRODISTENSION: SHX5453

## 2011-11-29 HISTORY — DX: Insomnia, unspecified: G47.00

## 2011-11-29 LAB — POCT I-STAT 4, (NA,K, GLUC, HGB,HCT)
Hemoglobin: 13.3 g/dL (ref 12.0–15.0)
Potassium: 4 mEq/L (ref 3.5–5.1)

## 2011-11-29 SURGERY — CYSTOSCOPY, WITH BLADDER HYDRODISTENSION
Anesthesia: General | Site: Bladder | Wound class: Clean Contaminated

## 2011-11-29 MED ORDER — CALCIUM GLYCEROPHOSPHATE 340 (65-50) MG (CA-P) PO TABS: 1.0000 | ORAL_TABLET | Freq: Three times a day (TID) | ORAL | Status: DC

## 2011-11-29 MED ORDER — STERILE WATER FOR IRRIGATION IR SOLN
Status: DC | PRN
  Administered 2011-11-29: 3000 mL

## 2011-11-29 MED ORDER — FENTANYL CITRATE 0.05 MG/ML IJ SOLN
25.0000 ug | INTRAMUSCULAR | Status: DC | PRN
  Administered 2011-11-29: 50 ug via INTRAVENOUS

## 2011-11-29 MED ORDER — PROMETHAZINE HCL 25 MG/ML IJ SOLN: 6.2500 mg | INTRAMUSCULAR | Status: DC | PRN

## 2011-11-29 MED ORDER — ONDANSETRON HCL 4 MG/2ML IJ SOLN
INTRAMUSCULAR | Status: DC | PRN
  Administered 2011-11-29: 4 mg via INTRAVENOUS

## 2011-11-29 MED ORDER — LIDOCAINE HCL (CARDIAC) 20 MG/ML IV SOLN
INTRAVENOUS | Status: DC | PRN
  Administered 2011-11-29: 100 mg via INTRAVENOUS

## 2011-11-29 MED ORDER — TRIMETHOPRIM 100 MG PO TABS: 100.0000 mg | ORAL_TABLET | ORAL | Status: AC

## 2011-11-29 MED ORDER — LACTATED RINGERS IV SOLN
INTRAVENOUS | Status: DC
  Administered 2011-11-29: 100 mL/h via INTRAVENOUS
  Administered 2011-11-29: 50 mL/h via INTRAVENOUS

## 2011-11-29 MED ORDER — HYDROCODONE-ACETAMINOPHEN 7.5-650 MG PO TABS: 1.0000 | ORAL_TABLET | Freq: Four times a day (QID) | ORAL | Status: AC | PRN

## 2011-11-29 MED ORDER — PROPOFOL 10 MG/ML IV EMUL
INTRAVENOUS | Status: DC | PRN
  Administered 2011-11-29: 200 mg via INTRAVENOUS

## 2011-11-29 MED ORDER — PHENAZOPYRIDINE HCL 200 MG PO TABS
ORAL | Status: DC | PRN
  Administered 2011-11-29: 13:00:00 via INTRAVESICAL

## 2011-11-29 MED ORDER — LACTATED RINGERS IV SOLN
INTRAVENOUS | Status: DC | PRN
  Administered 2011-11-29: 11:00:00 via INTRAVENOUS

## 2011-11-29 MED ORDER — CEFAZOLIN SODIUM-DEXTROSE 2-3 GM-% IV SOLR: 2.0000 g | INTRAVENOUS | Status: DC

## 2011-11-29 MED ORDER — FENTANYL CITRATE 0.05 MG/ML IJ SOLN
INTRAMUSCULAR | Status: DC | PRN
Start: 2011-11-29 — End: 2011-11-29
  Administered 2011-11-29 (×3): 50 ug via INTRAVENOUS

## 2011-11-29 MED ORDER — DEXAMETHASONE SODIUM PHOSPHATE 4 MG/ML IJ SOLN
INTRAMUSCULAR | Status: DC | PRN
  Administered 2011-11-29: 4 mg via INTRAVENOUS

## 2011-11-29 MED ORDER — BUPIVACAINE HCL 0.5 % IJ SOLN
INTRAMUSCULAR | Status: DC | PRN
  Administered 2011-11-29: 15 mL

## 2011-11-29 MED ORDER — KETOROLAC TROMETHAMINE 30 MG/ML IJ SOLN
INTRAMUSCULAR | Status: DC | PRN
  Administered 2011-11-29: 30 mg via INTRAVENOUS

## 2011-11-29 MED ORDER — MIDAZOLAM HCL 5 MG/ML IJ SOLN: 2.0000 mg | Freq: Once | INTRAMUSCULAR | Status: DC

## 2011-11-29 MED ORDER — MIDAZOLAM HCL 5 MG/5ML IJ SOLN
INTRAMUSCULAR | Status: DC | PRN
  Administered 2011-11-29: 2 mg via INTRAVENOUS

## 2011-11-29 MED ORDER — CEFAZOLIN SODIUM 1-5 GM-% IV SOLN: 1.0000 g | INTRAVENOUS | Status: DC

## 2011-11-29 MED ORDER — TRIAMCINOLONE ACETONIDE 40 MG/ML IJ SUSP
INTRAMUSCULAR | Status: DC | PRN
  Administered 2011-11-29: 40 mg via INTRAMUSCULAR

## 2011-11-29 MED ORDER — MIDAZOLAM HCL 2 MG/2ML IJ SOLN
1.0000 mg | INTRAMUSCULAR | Status: DC | PRN
  Administered 2011-11-29: 1 mg via INTRAVENOUS

## 2011-11-29 SURGICAL SUPPLY — 25 items
ADAPTER CATH WHT DISP STRL (CATHETERS) IMPLANT
ADPR CATH MP STRL LF DISP BD (CATHETERS)
BAG DRAIN URO-CYSTO SKYTR STRL (DRAIN) ×2 IMPLANT
BAG DRN UROCATH (DRAIN) ×1
BOOTIES KNEE HIGH SLOAN (MISCELLANEOUS) ×2 IMPLANT
CANISTER SUCT LVC 12 LTR MEDI- (MISCELLANEOUS) IMPLANT
CATH ROBINSON RED A/P 16FR (CATHETERS) IMPLANT
CLOTH BEACON ORANGE TIMEOUT ST (SAFETY) ×2 IMPLANT
DRAPE CAMERA CLOSED 9X96 (DRAPES) ×2 IMPLANT
ELECT REM PT RETURN 9FT ADLT (ELECTROSURGICAL) ×2
ELECTRODE REM PT RTRN 9FT ADLT (ELECTROSURGICAL) ×1 IMPLANT
GLOVE BIO SURGEON STRL SZ7.5 (GLOVE) ×2 IMPLANT
GLOVE ECLIPSE 8.0 STRL XLNG CF (GLOVE) ×1 IMPLANT
GOWN PREVENTION PLUS LG XLONG (DISPOSABLE) ×2 IMPLANT
GOWN STRL REIN XL XLG (GOWN DISPOSABLE) ×2 IMPLANT
NDL SAFETY ECLIPSE 18X1.5 (NEEDLE) ×1 IMPLANT
NDL SPNL 22GX7 QUINCKE BK (NEEDLE) IMPLANT
NEEDLE HYPO 18GX1.5 SHARP (NEEDLE) ×2
NEEDLE HYPO 22GX1.5 SAFETY (NEEDLE) IMPLANT
NEEDLE SPNL 22GX7 QUINCKE BK (NEEDLE) ×2 IMPLANT
NS IRRIG 500ML POUR BTL (IV SOLUTION) IMPLANT
PACK CYSTOSCOPY (CUSTOM PROCEDURE TRAY) ×2 IMPLANT
SYR 20CC LL (SYRINGE) ×4 IMPLANT
SYR BULB IRRIGATION 50ML (SYRINGE) IMPLANT
WATER STERILE IRR 3000ML UROMA (IV SOLUTION) ×2 IMPLANT

## 2011-11-29 NOTE — Anesthesia Postprocedure Evaluation (Signed)
  Anesthesia Post-op Note  Patient: Tricia [name]  Procedure(s) Performed:  CYSTOSCOPY/HYDRODISTENSION - M & P M & K  Patient Location: PACU  Anesthesia Type: General  Level of Consciousness: awake and alert   Airway and Oxygen Therapy: Patient Spontanous Breathing  Post-op Pain: mild  Post-op Assessment: Post-op Vital signs reviewed, Patient's Cardiovascular Status Stable, Respiratory Function Stable, Patent Airway and No signs of Nausea or vomiting  Post-op Vital Signs: stable  Complications: No apparent anesthesia complications

## 2011-11-29 NOTE — Anesthesia Preprocedure Evaluation (Signed)
Anesthesia Evaluation  Patient identified by MRN, date of birth, ID band Patient awake    Reviewed: Allergy & Precautions, H&P , NPO status , Patient's Chart, lab work & pertinent test results  Airway Mallampati: II TM Distance: >3 FB Neck ROM: Full    Dental No notable dental hx.    Pulmonary neg pulmonary ROS,  clear to auscultation  Pulmonary exam normal       Cardiovascular neg cardio ROS + dysrhythmias Ventricular Tachycardia Regular Normal No problem with Vtach once she started toprol. Took toprol today   Neuro/Psych  Headaches, PSYCHIATRIC DISORDERS Anxiety Depression Negative Neurological ROS  Negative Psych ROS   GI/Hepatic negative GI ROS, Neg liver ROS,   Endo/Other  Negative Endocrine ROS  Renal/GU negative Renal ROS  Genitourinary negative   Musculoskeletal negative musculoskeletal ROS (+)   Abdominal   Peds negative pediatric ROS (+)  Hematology negative hematology ROS (+)   Anesthesia Other Findings   Reproductive/Obstetrics negative OB ROS                           Anesthesia Physical Anesthesia Plan  ASA: III  Anesthesia Plan: General   Post-op Pain Management:    Induction: Intravenous  Airway Management Planned:   Additional Equipment:   Intra-op Plan:   Post-operative Plan: Extubation in OR  Informed Consent: I have reviewed the patients History and Physical, chart, labs and discussed the procedure including the risks, benefits and alternatives for the proposed anesthesia with the patient or authorized representative who has indicated his/her understanding and acceptance.   Dental advisory given  Plan Discussed with: CRNA  Anesthesia Plan Comments:         Anesthesia Quick Evaluation

## 2011-11-29 NOTE — H&P (Signed)
Chief Complaint  cc: Dr. Arelia Sneddon   Reason For Visit  Incontinence & pelvic pain   Active Problems Problems  1. Chronic Interstitial Cystitis 595.1 2. Feelings Of Urinary Urgency 788.63 3. Urinary Frequency 788.41  History of Present Illness          Tricia [name] is a 46 yo divorced, female, last seen 08/26/08, returns today because of incontinence, pelvic pain & urine has an odor.  Hx of IC.  She is no longer taking Elmiron 100 mg 2 po BID for her IC.  She has urgency, frequency, hesistancy, nocturia, weak flow & incontinence.  She has taken Vesicare in the past but did not feel it controlled urgency/frequency either. Para 1-1-0 Maylon Peppers).   Past Medical History Problems  1. History of  Chronic Interstitial Cystitis 595.1 2. History of  Depression 311 3. History of  Ventricular Tachycardia 427.1  Surgical History Problems  1. History of  Bladder Surgery 2. History of  Cystoscopy With Dilation Of Bladder Under Local Anesthesia  Current Meds 1. Ambien 10 MG Oral Tablet; Therapy: (Recorded:16Oct2009) to 2. FLUoxetine HCl 20 MG Oral Tablet; Therapy: (Recorded:08Jan2013) to 3. Ocella TABS; Therapy: (Recorded:08Jan2013) to 4. Toprol XL 25 MG Oral Tablet Extended Release 24 Hour; Therapy: (Recorded:08Jan2013) to 5. Valtrex 500 MG Oral Tablet; Therapy: (Recorded:16Oct2009) to  Allergies Medication  1. No Known Drug Allergies  Family History Problems  1. Family history of  Adenocarcinoma Of The Cervix V16.49 maternal great grandmother 2. Maternal history of  Atrial Fibrillation 3. Daughter's history of  Chronic Interstitial Cystitis 4. Family history of  Family Health Status - Father's Age 29 5. Family history of  Family Health Status - Mother's Age 78 6. Family history of  Family Health Status - Mother's Age 78 7. Family history of  Family Health Status Father 61 8. Family history of  Family Health Status Number Of Children 1 daughter 22. Maternal grandmother's history  of  Ovarian Cancer V16.41  Social History Problems    Alcohol Use 1 ppd   Alcohol Use 2 per day   Marital History - Currently Married   Marital History - Divorced V61.03   Occupation: Passenger transport manager Denied    Caffeine Use   History of  Caffeine Use   Tobacco Use  Review of Systems Otolaryngeal, hematologic/lymphatic, cardiovascular, pulmonary, endocrine and musculoskeletal system(s) were reviewed and pertinent findings if present are noted.  Genitourinary: urinary frequency, urinary urgency, hematuria and foul smelling urine.  Gastrointestinal: nausea, abdominal pain and constipation, but no vomiting.  Constitutional: night sweats.  Neurological: headache.    Vitals Vital Signs [Data Includes: Last 1 Day]  08Jan2013 04:24PM  BMI Calculated: 33.43 BSA Calculated: 2.08 Height: 5 ft 7 in Weight: 213 lb  Blood Pressure: 129 / 90, RUE, Sitting Temperature: 98.7 F, Oral Heart Rate: 75  Physical Exam Genitourinary:. Pelvic pain with digital pelvic. Examination of the external genitalia shows normal female external genitalia and no lesions. The urethra is normal in appearance and not tender. There is no urethral mass. Vaginal exam demonstrates no abnormalities. The adnexa are palpably normal. The bladder is non tender and not distended. The anus is normal on inspection. The perineum is normal on inspection.    Results/Data Urine [Data Includes: Last 1 Day]   08Jan2013  COLOR YELLOW   APPEARANCE CLEAR   SPECIFIC GRAVITY 1.025   pH 6.0   GLUCOSE NEG mg/dL  BILIRUBIN NEG   KETONE NEG mg/dL  BLOOD TRACE   PROTEIN TRACE mg/dL  UROBILINOGEN 0.2 mg/dL  NITRITE NEG   LEUKOCYTE ESTERASE NEG   SQUAMOUS EPITHELIAL/HPF FEW   WBC NONE SEEN WBC/hpf  RBC 0-3 RBC/hpf  BACTERIA NONE SEEN   CRYSTALS NONE SEEN   CASTS Hyaline casts noted   Other GRANULAR CASTS ALSO OBS.    Assessment Assessed  1. Chronic Interstitial Cystitis 595.1 2. Feelings Of Urinary Urgency 788.63 3.  Urinary Frequency 788.41   Cysto Q because Elmiron didn't work.  She needs cystoscopy and HOD.   Plan Chronic Interstitial Cystitis (595.1)  1. Follow-up Schedule Surgery Office  Follow-up  Done: 08Jan2013 Feelings Of Urinary Urgency (788.63), Urinary Frequency (788.41)  2. Myrbetriq 50 MG Oral Tablet Extended Release 24 Hour; TAKE 1 TABLET Daily; Therapy:  08Jan2013 to (Evaluate:06Aug2013)  Requested for: 08Jan2013; Last Rx:08Jan2013; Status:  ACTIVE - Retrospective Authorization; Edited      Cysto and HOD.  Myrbetriq   Signatures Electronically signed by : Jethro Bolus, M.D.; Nov 19 2011  5:07PM

## 2011-11-29 NOTE — Transfer of Care (Signed)
Immediate Anesthesia Transfer of Care Note  Patient: Tricia [name]  Procedure(s) Performed:  CYSTOSCOPY/HYDRODISTENSION - M & P M & K  Patient Location: PACU  Anesthesia Type: General  Level of Consciousness: awake, sedated, patient cooperative and responds to stimulation  Airway & Oxygen Therapy: Patient Spontanous Breathing and Patient connected to face mask oxygen  Post-op Assessment: Report given to PACU RN, Post -op Vital signs reviewed and stable and Patient moving all extremities  Post vital signs: Reviewed and stable  Complications: No apparent anesthesia complications

## 2011-11-29 NOTE — Anesthesia Procedure Notes (Signed)
Procedure Name: LMA Insertion Date/Time: 11/29/2011 12:30 PM Performed by: Iline Oven Pre-anesthesia Checklist: Patient identified, Emergency Drugs available, Suction available and Patient being monitored Patient Re-evaluated:Patient Re-evaluated prior to inductionOxygen Delivery Method: Circle System Utilized Preoxygenation: Pre-oxygenation with 100% oxygen Intubation Type: IV induction Ventilation: Mask ventilation without difficulty LMA: LMA inserted LMA Size: 3.0 Number of attempts: 1 Airway Equipment and Method: bite block Placement Confirmation: positive ETCO2 Tube secured with: Tape Dental Injury: Teeth and Oropharynx as per pre-operative assessment

## 2011-11-29 NOTE — Op Note (Signed)
Pre-operative diagnosis : Interstitial Cystitis  Postoperative diagnosis: Same  Operation: Cystoscopy, Hydrodistention, Instillation of Pyridium/Marcaine; Injection of Marcaine/Kenalog  Surgeon: Imelda Pillow, MD  Anesthesia:Gen LMA  Preparation: After appropriate pre-anesthesia, the patient is brought to the operating room and placed upon the operating table in the supine position, where general LMA anesthesia is introduced. She is replaced in the dorsal lithotomy position, and the pubis is prepped with betadine solution and draped in usual fashion.   Review history:45 yo female with hx IC off Elmiron. She is c/o frequency, urgency, nocturia and weak flow, and sensory urge incontinence. Failed Vesicare in the past.   Statement of  Likelihood of Success: Excellent. TIME-OUT observed.:  Procedure: The bladder is cystoscopically hydrodistended to 1000cc.  Microulcers are photodocumented. Marcaine/pyridium solution is instilled into the bladder and marcaine/kenalog is injected into the subtrigonal space.   The patient received  IV Toradol, and B/O suppository.

## 2011-11-29 NOTE — Interval H&P Note (Signed)
History and Physical Interval Note:  11/29/2011 11:43 AM  Tricia [name]  has presented today for surgery, with the diagnosis of INTERSTITIAL CYSTITIS  The various methods of treatment have been discussed with the patient and family. After consideration of risks, benefits and other options for treatment, the patient has consented to  Procedure(s): CYSTOSCOPY/HYDRODISTENSION as a surgical intervention .  The patients' history has been reviewed, patient examined, no change in status, stable for surgery.  I have reviewed the patients' chart and labs.  Questions were answered to the patient's satisfaction.     Jethro Bolus I

## 2011-12-02 ENCOUNTER — Encounter (HOSPITAL_BASED_OUTPATIENT_CLINIC_OR_DEPARTMENT_OTHER): Payer: Self-pay | Admitting: Urology

## 2012-01-16 ENCOUNTER — Inpatient Hospital Stay (HOSPITAL_COMMUNITY)
Admission: RE | Admit: 2012-01-16 | Discharge: 2012-01-19 | DRG: 750 | Disposition: A | Discharge status: Home / Self Care | Payer: BC Managed Care – PPO | Attending: Psychiatry | Admitting: Psychiatry

## 2012-01-16 ENCOUNTER — Encounter (HOSPITAL_COMMUNITY): Payer: Self-pay | Admitting: *Deleted

## 2012-01-16 ENCOUNTER — Other Ambulatory Visit: Payer: Self-pay

## 2012-01-16 ENCOUNTER — Telehealth: Payer: Self-pay | Admitting: *Deleted

## 2012-01-16 DIAGNOSIS — Z981 Arthrodesis status: Secondary | ICD-10-CM

## 2012-01-16 DIAGNOSIS — Z79899 Other long term (current) drug therapy: Secondary | ICD-10-CM

## 2012-01-16 DIAGNOSIS — Z888 Allergy status to other drugs, medicaments and biological substances status: Secondary | ICD-10-CM

## 2012-01-16 DIAGNOSIS — R45851 Suicidal ideations: Secondary | ICD-10-CM

## 2012-01-16 DIAGNOSIS — F3289 Other specified depressive episodes: Secondary | ICD-10-CM

## 2012-01-16 DIAGNOSIS — F411 Generalized anxiety disorder: Secondary | ICD-10-CM

## 2012-01-16 DIAGNOSIS — G4700 Insomnia, unspecified: Secondary | ICD-10-CM

## 2012-01-16 DIAGNOSIS — IMO0002 Reserved for concepts with insufficient information to code with codable children: Secondary | ICD-10-CM

## 2012-01-16 DIAGNOSIS — F1010 Alcohol abuse, uncomplicated: Principal | ICD-10-CM | POA: Diagnosis present

## 2012-01-16 DIAGNOSIS — I428 Other cardiomyopathies: Secondary | ICD-10-CM

## 2012-01-16 DIAGNOSIS — F4325 Adjustment disorder with mixed disturbance of emotions and conduct: Secondary | ICD-10-CM

## 2012-01-16 DIAGNOSIS — F329 Major depressive disorder, single episode, unspecified: Secondary | ICD-10-CM

## 2012-01-16 DIAGNOSIS — N3010 Interstitial cystitis (chronic) without hematuria: Secondary | ICD-10-CM

## 2012-01-16 LAB — COMPREHENSIVE METABOLIC PANEL
Albumin: 3.8 g/dL (ref 3.5–5.2)
Alkaline Phosphatase: 56 U/L (ref 39–117)
BUN: 19 mg/dL (ref 6–23)
Calcium: 9.5 mg/dL (ref 8.4–10.5)
Creatinine, Ser: 0.91 mg/dL (ref 0.50–1.10)
GFR calc Af Amer: 87 mL/min — ABNORMAL LOW (ref >90)
Glucose, Bld: 97 mg/dL (ref 70–99)
Potassium: 4.3 mEq/L (ref 3.5–5.1)
Total Protein: 7.9 g/dL (ref 6.0–8.3)

## 2012-01-16 LAB — RAPID URINE DRUG SCREEN, HOSP PERFORMED
Benzodiazepines: POSITIVE — AB
Cocaine: NOT DETECTED
Opiates: POSITIVE — AB

## 2012-01-16 LAB — ETHANOL: Alcohol, Ethyl (B): 24 mg/dL — ABNORMAL HIGH (ref 0–11)

## 2012-01-16 LAB — CBC
HCT: 36.7 % (ref 36.0–46.0)
MCH: 33.4 pg (ref 26.0–34.0)
MCHC: 34.3 g/dL (ref 30.0–36.0)
RDW: 12.4 % (ref 11.5–15.5)

## 2012-01-16 LAB — PREGNANCY, URINE: Preg Test, Ur: NEGATIVE

## 2012-01-16 MED ORDER — LOPERAMIDE HCL 2 MG PO CAPS
2.0000 mg | ORAL_CAPSULE | ORAL | Status: AC | PRN
  Filled 2012-01-16: qty 2

## 2012-01-16 MED ORDER — CHLORDIAZEPOXIDE HCL 25 MG PO CAPS
25.0000 mg | ORAL_CAPSULE | Freq: Once | ORAL | Status: AC
  Administered 2012-01-16: 25 mg via ORAL
  Filled 2012-01-16 (×2): qty 1

## 2012-01-16 MED ORDER — BUSPIRONE HCL 15 MG PO TABS: 7.5000 mg | ORAL_TABLET | Freq: Two times a day (BID) | ORAL | Status: DC

## 2012-01-16 MED ORDER — HYDROXYZINE HCL 25 MG PO TABS
25.0000 mg | ORAL_TABLET | Freq: Four times a day (QID) | ORAL | Status: AC | PRN
  Administered 2012-01-16 – 2012-01-18 (×3): 25 mg via ORAL
  Filled 2012-01-16 (×2): qty 1

## 2012-01-16 MED ORDER — CHLORDIAZEPOXIDE HCL 25 MG PO CAPS
25.0000 mg | ORAL_CAPSULE | Freq: Four times a day (QID) | ORAL | Status: AC | PRN
  Administered 2012-01-18 – 2012-01-19 (×3): 25 mg via ORAL
  Filled 2012-01-16 (×4): qty 1

## 2012-01-16 MED ORDER — VITAMIN B-1 100 MG PO TABS
100.0000 mg | ORAL_TABLET | Freq: Every day | ORAL | Status: DC
  Administered 2012-01-17 – 2012-01-19 (×3): 100 mg via ORAL
  Filled 2012-01-16 (×5): qty 1

## 2012-01-16 MED ORDER — MAGNESIUM HYDROXIDE 400 MG/5ML PO SUSP
30.0000 mL | Freq: Every day | ORAL | Status: DC | PRN
  Filled 2012-01-16: qty 30

## 2012-01-16 MED ORDER — CALCIUM GLYCEROPHOSPHATE 340 (65-50) MG (CA-P) PO TABS: 1.0000 | ORAL_TABLET | Freq: Three times a day (TID) | ORAL | Status: DC

## 2012-01-16 MED ORDER — ZOLPIDEM TARTRATE 5 MG PO TABS: 5.0000 mg | ORAL_TABLET | Freq: Every evening | ORAL | Status: DC | PRN

## 2012-01-16 MED ORDER — CHLORDIAZEPOXIDE HCL 25 MG PO CAPS: 25.0000 mg | ORAL_CAPSULE | Freq: Every day | ORAL | Status: DC

## 2012-01-16 MED ORDER — CHLORDIAZEPOXIDE HCL 25 MG PO CAPS: 25.0000 mg | ORAL_CAPSULE | Freq: Three times a day (TID) | ORAL | Status: DC

## 2012-01-16 MED ORDER — ONDANSETRON 4 MG PO TBDP
4.0000 mg | ORAL_TABLET | Freq: Four times a day (QID) | ORAL | Status: AC | PRN
  Filled 2012-01-16: qty 1

## 2012-01-16 MED ORDER — ALUM & MAG HYDROXIDE-SIMETH 200-200-20 MG/5ML PO SUSP
30.0000 mL | ORAL | Status: DC | PRN
  Filled 2012-01-16: qty 30

## 2012-01-16 MED ORDER — FLUOXETINE HCL 20 MG PO CAPS
20.0000 mg | ORAL_CAPSULE | Freq: Every day | ORAL | Status: DC
  Administered 2012-01-17 – 2012-01-19 (×3): 20 mg via ORAL
  Filled 2012-01-16 (×6): qty 1

## 2012-01-16 MED ORDER — ACETAMINOPHEN 325 MG PO TABS
650.0000 mg | ORAL_TABLET | Freq: Four times a day (QID) | ORAL | Status: DC | PRN
  Administered 2012-01-17 (×2): 650 mg via ORAL
  Filled 2012-01-16: qty 2

## 2012-01-16 MED ORDER — TRAZODONE HCL 50 MG PO TABS
50.0000 mg | ORAL_TABLET | Freq: Every evening | ORAL | Status: DC | PRN
  Administered 2012-01-16: 50 mg via ORAL
  Filled 2012-01-16 (×2): qty 1

## 2012-01-16 MED ORDER — METOPROLOL SUCCINATE ER 25 MG PO TB24
25.0000 mg | ORAL_TABLET | Freq: Two times a day (BID) | ORAL | Status: DC
  Administered 2012-01-16 – 2012-01-19 (×6): 25 mg via ORAL
  Filled 2012-01-16 (×13): qty 1

## 2012-01-16 MED ORDER — ADULT MULTIVITAMIN W/MINERALS CH
1.0000 | ORAL_TABLET | Freq: Every day | ORAL | Status: DC
  Administered 2012-01-16 – 2012-01-19 (×4): 1 via ORAL
  Filled 2012-01-16 (×7): qty 1

## 2012-01-16 MED ORDER — CHLORDIAZEPOXIDE HCL 25 MG PO CAPS
25.0000 mg | ORAL_CAPSULE | Freq: Four times a day (QID) | ORAL | Status: DC
  Administered 2012-01-16 – 2012-01-17 (×3): 25 mg via ORAL
  Filled 2012-01-16 (×3): qty 1

## 2012-01-16 MED ORDER — THIAMINE HCL 100 MG/ML IJ SOLN
100.0000 mg | Freq: Once | INTRAMUSCULAR | Status: DC
  Filled 2012-01-16: qty 1

## 2012-01-16 MED ORDER — VALACYCLOVIR HCL 500 MG PO TABS
500.0000 mg | ORAL_TABLET | Freq: Two times a day (BID) | ORAL | Status: DC
  Administered 2012-01-16 – 2012-01-19 (×2): 500 mg via ORAL
  Filled 2012-01-16 (×11): qty 1

## 2012-01-16 MED ORDER — DROSPIRENONE-ETHINYL ESTRADIOL 3-0.03 MG PO TABS: 1.0000 | ORAL_TABLET | Freq: Every day | ORAL | Status: DC

## 2012-01-16 MED ORDER — CHLORDIAZEPOXIDE HCL 25 MG PO CAPS: 25.0000 mg | ORAL_CAPSULE | ORAL | Status: DC
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
## 2012-01-16 NOTE — Telephone Encounter (Signed)
rx sent, have pt schedule f/u with me here to discuss in next month.  If condition worsens in meantime, then notify me.  Thanks.

## 2012-01-16 NOTE — Tx Team (Signed)
Initial Interdisciplinary Treatment Plan  PATIENT STRENGTHS: (choose at least two) Ability for insight Capable of independent living Communication skills Religious Affiliation Supportive family/friends  PATIENT STRESSORS: Loss of stepmother last thursday* Substance abuse   PROBLEM LIST: Problem List/Patient Goals Date to be addressed Date deferred Reason deferred Estimated date of resolution  Substance abuse 01/16/12     Decrease Depression 01/16/12                                                DISCHARGE CRITERIA:  Adequate post-discharge living arrangements Improved stabilization in mood, thinking, and/or behavior Withdrawal symptoms are absent or subacute and managed without 24-hour nursing intervention  PRELIMINARY DISCHARGE PLAN: Attend aftercare/continuing care group Attend 12-step recovery group Outpatient therapy Return to previous living arrangement  PATIENT/FAMIILY INVOLVEMENT: This treatment plan has been presented to and reviewed with the patient, Tricia [name], and/or family member.  The patient and family have been given the opportunity to ask questions and make suggestions.  [name] 01/16/2012, 3:18 PM

## 2012-01-16 NOTE — Progress Notes (Signed)
Patient admitted to the unit this afternoon. She is adjusting very well to the milieu. She is interacting well with staff and peers. She is attending group, med compliant and seems to be investing in treatment. Her mood and affect anxious and flat. She requested for medication for anxiety at 2015. She received Vistaril which was very effective. Pt denied SI/HI and denied hallucinations. V/S within normal limit. She enquired about how long the detox takes and if things would get better. Writer provided information, support and encouragement. She received her HS medications without problems, including PRN Trazodone for sleep. Q 15 minute checks continues to maintain safety.
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
## 2012-01-16 NOTE — Telephone Encounter (Signed)
LMOVM of number listed as home and cell phone.

## 2012-01-16 NOTE — Telephone Encounter (Signed)
Patient called to let Dr. Para March know that she has been off of Buspar 15mg  for about two months now and has been depressed every since.  She wants to restart the medication.  Please advise, uses [employer].
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
## 2012-01-17 MED ORDER — TRAZODONE HCL 100 MG PO TABS: 100.0000 mg | ORAL_TABLET | Freq: Every evening | ORAL | Status: DC | PRN

## 2012-01-17 MED ORDER — TRAZODONE HCL 100 MG PO TABS
100.0000 mg | ORAL_TABLET | Freq: Every day | ORAL | Status: DC
  Administered 2012-01-17: 100 mg via ORAL
  Filled 2012-01-17 (×4): qty 1

## 2012-01-17 NOTE — Progress Notes (Signed)
Lying quietly in bed with eyes closed.  Skin warm and dry to touch.  Exhibiting normal sleep behavior.  Safety maintained via Q 15 minute safety checks.

## 2012-01-17 NOTE — Progress Notes (Signed)
[facility] Group Notes:  (Counselor/Nursing/MHT/Case Management/Adjunct)  01/17/2012 11:52 AM  Type of Therapy:  Group Therapy  Participation Level:  Minimal  Participation Quality:  Appropriate  Affect:  Appropriate  Cognitive:  Appropriate  Insight:  None  Engagement in Group:  None  Engagement in Therapy:  Limited  Modes of Intervention:  Support  Summary of Progress/Problems:Patient did not actively participate, but seemed to listen attentively while others spoke.   Mendleson, Sheridyn Canino 01/17/2012, 11:52 AM
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
## 2012-01-17 NOTE — Treatment Plan (Signed)
New patient attended AM group, good participation.   States she is here for help with detox from alcohol.  Impetus for coming in was stepmother's suicide last week.  She was an alcoholic.  Lives in [place] on her own.  Working at [employer].  Plans to return home, follow up with AA.

## 2012-01-17 NOTE — Progress Notes (Signed)
Patient assimilating into milieu. Headache this AM, medicated with relief. Mood; depressed, affect flat. Attended groups. Supported and encouraged.

## 2012-01-17 NOTE — Progress Notes (Signed)
[facility] Group Notes:  (Counselor/Nursing/MHT/Case Management/Adjunct)  01/17/2012 4:12 PM  Type of Therapy:  Group Therapy at 1:15 PM  Participation Level:  Minimal  Participation Quality:  Attentive  Affect:  Appropriate  Cognitive:  Appropriate  Insight:  Unknown  Engagement in Group:  Good  Engagement in Therapy:  Unknown  Modes of Intervention:  Education, Socialization and Support  Summary of Progress/Problems:  Francess did not actively participate, but seemed to listen attentively while others spoke. She did share that she would like to stop drinking every evening.    Clide Dales 01/17/2012, 4:12 PM

## 2012-01-17 NOTE — Progress Notes (Signed)
Pt. Denies SI/HI and denies A/V hallucinations.  Cooperative.  Reports feeling better.  Encouragement given.

## 2012-01-18 DIAGNOSIS — F1010 Alcohol abuse, uncomplicated: Principal | ICD-10-CM

## 2012-01-18 MED ORDER — DOXEPIN HCL 50 MG PO CAPS
50.0000 mg | ORAL_CAPSULE | Freq: Every day | ORAL | Status: DC
  Filled 2012-01-18: qty 1

## 2012-01-18 MED ORDER — DOXEPIN HCL 25 MG PO CAPS
50.0000 mg | ORAL_CAPSULE | Freq: Every day | ORAL | Status: DC
  Administered 2012-01-18: 50 mg via ORAL
  Filled 2012-01-18: qty 2

## 2012-01-18 NOTE — Progress Notes (Signed)
Slept poorly last nite, appetite is good, energy level is normal, ability to pay attention is good, depression 1/10 and hopeless 1/10, denies SI or HI, goal is attend AA meetings, taking meds as ordered by MD, attending meals in the DR, voicing no complaints. q73min safety checks continue and support offered Safety maintained

## 2012-01-18 NOTE — Progress Notes (Signed)
Patient ID: Tricia [name], female   DOB: 14-Nov-1965, 46 y.o.   MRN: [id number] Pt. attended and participated in aftercare planning group. Pt. accepted information on suicide prevention, warning signs to look for with suicide and crisis line numbers to use. The pt. agreed to call crisis line numbers if having warning signs or having thoughts of suicide. Pt. listed their current anxiety level as low.  Pt. accepted AA and NA meeting schedules.

## 2012-01-18 NOTE — Progress Notes (Signed)
  Tricia [name] is a 46 y.o. female [id number] 1966-08-31  01/16/2012 Principal Problem:  *Alcohol abuse, daily use   Mental Status: Alert & oriented mood and affect is euthymic. Denies SI/HI/AVH.    Subjective/Objective: Wants to go home today. Treatment team notes do indicate she could discharge if stable. Dr.J wants her to stay today and consider discharge tomorrow. She lives alone and usually drinks in the evening.    Filed Vitals:   01/18/12 1220  BP: 138/92  Pulse: 81  Temp: 97.1 F (36.2 C)  Resp: 18    Lab Results:   BMET    Component Value Date/Time   NA 136 01/16/2012 2012   K 4.3 01/16/2012 2012   CL 99 01/16/2012 2012   CO2 27 01/16/2012 2012   GLUCOSE 97 01/16/2012 2012   BUN 19 01/16/2012 2012   CREATININE 0.91 01/16/2012 2012   CALCIUM 9.5 01/16/2012 2012   GFRNONAA 75* 01/16/2012 2012   GFRAA 87* 01/16/2012 2012    Medications:  Scheduled:     . drospirenone-ethinyl estradiol  1 tablet Oral Daily  . FLUoxetine  20 mg Oral Daily  . metoprolol succinate  25 mg Oral BID  . mulitivitamin with minerals  1 tablet Oral Daily  . thiamine  100 mg Intramuscular Once  . thiamine  100 mg Oral Daily  . traZODone  100 mg Oral QHS  . valACYclovir  500 mg Oral BID  . DISCONTD: chlordiazePOXIDE  25 mg Oral QID  . DISCONTD: chlordiazePOXIDE  25 mg Oral TID  . DISCONTD: chlordiazePOXIDE  25 mg Oral BH-qamhs  . DISCONTD: chlordiazePOXIDE  25 mg Oral Daily     PRN Meds acetaminophen, alum & mag hydroxide-simeth, chlordiazePOXIDE, hydrOXYzine, loperamide, magnesium hydroxide, ondansetron, DISCONTD: traZODone, DISCONTD: traZODone  Plan : can allow Doxepin to help with sleep & anxiety.  Reilly Blades,MICKIE D. 01/18/2012

## 2012-01-18 NOTE — Progress Notes (Signed)
[facility] Group Notes:  (Counselor/Nursing/MHT/Case Management/Adjunct)  01/18/2012 1:15 PM  Type of Therapy:  Group Therapy, Dance/Movement Therapy   Participation Level:  Active  Participation Quality:  Attentive, Sharing and Supportive  Affect:  Appropriate  Cognitive:  Appropriate  Insight:  Limited  Engagement in Group:  Limited  Engagement in Therapy:  Limited  Modes of Intervention:  Clarification, Problem-solving, Role-play, Socialization and Support  Summary of Progress/Problems: Pt participated in a group discussion on how to communicate needs when feeling frustrated. Pt spoke about feeling "frustrated" today. Pt spoke about wanting to go home tomorrow and feeling "ready" to start her recovery. Pt outlined a plan to attend 12 step meetings once D/C. Gevena Mart

## 2012-01-18 NOTE — Progress Notes (Signed)
Lying quietly in bed with eyes closed.  Skin warn and dry to touch. Safety maintained via Q 15 min safety checks.

## 2012-01-18 NOTE — Progress Notes (Signed)
[facility] Adult Inpatient Family/Significant Other Suicide Prevention Education  Suicide Prevention Education:  Education Completed; [name] [phone] mother,  (name of family member/significant other) has been identified by the patient as the family member/significant other with whom the patient will be residing, and identified as the person(s) who will aid the patient in the event of a mental health crisis (suicidal ideations/suicide attempt).  With written consent from the patient, the family member/significant other has been provided the following suicide prevention education, prior to the and/or following the discharge of the patient.  The suicide prevention education provided includes the following:  Suicide risk factors  Suicide prevention and interventions  National Suicide Hotline telephone number  [facility] assessment telephone number  [facility] Emergency Assistance 911  [facility] and/or Residential Mobile Crisis Unit telephone number  Request made of family/significant other to:  Remove weapons (e.g., guns, rifles, knives), all items previously/currently identified as safety concern.    Remove drugs/medications (over-the-counter, prescriptions, illicit drugs), all items previously/currently identified as a safety concern.  The family member/significant other verbalizes understanding of the suicide prevention education information provided.  The family member/significant other agrees to remove the items of safety concern listed above.  Pt.'s mother repots that the pt can not drink just one and "gets mean" when drunk. Pt.'s mother reports that all ethyol has been removed from her home. Pt.'s mother states that the gun has been removed and her father has it, no other guns are in the home. Pt.'s mother reports the only Ambien the pt has is in the locker here at [facility]. Factors leading to depression are recent weight gain and falling due to being drunk and breaking  her toe. Pt.'s mother reports the pt needs to work and that her job knows about her drinking problem and is supportive. Pt.'s mother is worried about her returning home alone but states the "entire family" is offering support providing the pt "does not lie" and is truthful.  [facility] 01/18/2012, 10:38 AM

## 2012-01-18 NOTE — Progress Notes (Signed)
(  D) Pt. Awake, alert, NAD.  Appropriately groomed and dressed.  Interacting appropriately with staff and other patients.  Affect is bright, mood is appropriate.  (A) Reviewed nursing plan of care.  (R) Pt. Denies SI/HI, A/V hallucinations.  States she was frustrated earlier today because she was supposed to be discharged today but that has been deferred.  She states she feels better this afternoon.  (Pt.'s stepmother completed suicide with a gun 2 weeks ago.) She participated in recreation in the courtyard this afternoon.

## 2012-01-19 MED ORDER — DOXEPIN HCL 50 MG PO CAPS: 50.0000 mg | ORAL_CAPSULE | Freq: Every day | ORAL | Status: DC

## 2012-01-19 NOTE — Discharge Summary (Signed)
Physician Discharge Summary Note  Patient:  Tricia [name] is an 46 y.o., female MRN:  [id number] DOB:  Nov 09, 1966 Patient phone:  [phone] (home)  Patient address:   [address] [place] [address],   Date of Admission:  01/16/2012 Date of Discharge: 01/19/2012   Reason for Admission: Patient became unusually depressed after her usual evening alcohol. Called her daughter threatening to shoot herself. Daughter called patient' s father who came and removed the gun.Patient's stepmother had just suicided by self inflicted GSW on MArch 1st.  Patient became aware of her alcohol use being inappropriate while here. Plans to go to AA meetings today and will return to work Monday morning.   Discharge Diagnoses: Principal Problem:  *Alcohol abuse, daily use   Axis Diagnosis:   AXIS I:  Alcohol Abuse AXIS II:  Deferred AXIS III:   Past Medical History  Diagnosis Date  . Anxiety     Panic  . Depression   . Interstitial cystitis   . Cardiomyopathy     EF 45% by myoview, 45-50% by echo, 50% by MRI. ? PVC-associated cardiomyopathy  . Chest pain     ETT myoview 4/11) 6'15", frequent PVCs and run NSVT, mild global HK with EF 45%, fixed anterior defect was likely soft tissue attenuation so no evidence for ischemia or infarction.  . Abnormal MRI, cervical spine 08/2010    with uncovertebral disease on the left at C6-7 encroaching on the left C7 root.  . Toe fracture, right 11/04/11    2,3rd toes  . Insomnia     uses ambien  . Premature ventricular contractions     resolved with metoprolol   AXIS IV:  stepmother's recent suicide  AXIS V:  61-70 mild symptoms  Level of Care:  OP  Hospital Course:  No evidence for alcohol withdrawal. Patient demonstrated good insight and judgment once sober.   Consults:  None  Significant Diagnostic Studies:  None  Discharge Vitals:   Blood pressure 114/76, pulse 82, temperature 97.3 F (36.3 C), temperature source Oral, resp. rate 20,  height 5\' 7"  (1.702 m), weight 97.07 kg (214 lb).  Mental Status Exam: See Mental Status Examination and Suicide Risk Assessment completed by Attending Physician prior to discharge.  Discharge destination:  Home  Is patient on multiple antipsychotic therapies at discharge:  No   Has Patient had three or more failed trials of antipsychotic monotherapy by history:  No  Medication List  As of 01/19/2012 11:13 AM   STOP taking these medications         zolpidem 10 MG tablet         TAKE these medications      Indication    busPIRone 15 MG tablet   Commonly known as: BUSPAR   Take 15 mg by mouth 2 (two) times daily.       doxepin 50 MG capsule   Commonly known as: SINEQUAN   Take 1 capsule (50 mg total) by mouth at bedtime.       FLUoxetine 20 MG tablet   Commonly known as: PROZAC   Take 1 tablet (20 mg total) by mouth daily.       metoprolol succinate 25 MG 24 hr tablet   Commonly known as: TOPROL-XL   Take 1 tablet (25 mg total) by mouth 2 (two) times daily.       OCELLA 3-0.03 MG tablet   Generic drug: drospirenone-ethinyl estradiol   Take 1 tablet by mouth daily.  valACYclovir 500 MG tablet   Commonly known as: VALTREX   Take 500 mg by mouth 2 (two) times daily as needed. For fever blisters            Follow-up Information    Follow up with [facility]  IOP. (Call Charmian Muff at this # if you are interested in our IOP that meets 3 afternoons a week)    Contact information:   [address]  [phone]      Follow up with [facility]. (Call the [facility] if you want IOP groups in the evening)    Contact information:   [phone]         Follow-up recommendations:  To go to AA Take meds as prescribed.   Comments:  Mother and daughter were included in plan by telephone. Initially some concern as patient lives alone but she has made appropriate plans to go to AA and wants to go back to work Monday  am.  Signed: Arriona Prest,MICKIE D. 01/19/2012, 11:13 AM

## 2012-01-19 NOTE — Progress Notes (Signed)
[facility] Case Management Discharge Plan:  Will you be returning to the same living situation after discharge: Yes,  home, alone At discharge, do you have transportation home?:Yes,  pt.'s mother Do you have the ability to pay for your medications:Yes,    Interagency Information:     Release of information consent forms completed and in the chart;  Patient's signature needed at discharge.  Patient to Follow up at:  Follow-up Information    Follow up with [facility]  IOP. (Call Charmian Muff at this # if you are interested in our IOP that meets 3 afternoons a week)    Contact information:   [address]  [phone]      Follow up with [facility]. (Call the [facility] if you want IOP groups in the evening)    Contact information:   [phone]       Rob Leonette Monarch with [employer].  Patient denies SI/HI:   Yes,      Safety Planning and Suicide Prevention discussed:  Yes,  with pt and with her mother  Barrier to discharge identified:No.  Summary and Recommendations: pt stated she will go to a 6 PM AA meeting tonight with her mother. Pt will also call Rob Harmen with [facility] where she has 5 free therapy sessions. Pt stated she has worked with Rob before and feels comfortable calling him and seting up an apt. Pt was also given information on the IOP to call as needed.   Gevena Mart 01/19/2012, 2:22 PM

## 2012-01-19 NOTE — Progress Notes (Signed)
Pt D/C home. Pt rx and follow-up visits were reviewed and pt verbalized understanding. Pt denies SI/HI. Pt belongings were returned.  

## 2012-01-19 NOTE — Discharge Summary (Signed)
Patient was seen along with the PA and reviewed the chart. Agree with the discharge plan.

## 2012-01-19 NOTE — Progress Notes (Signed)
Pt. Awake walking up and down the hall stating she has been having a difficult sleeping, pt. Expressed disappointment in not being discharged today stating she was really upset. 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 This would be passed on in report in the am. Pt./ denies SI or HI and contracts for safety. States she was not close to her stepmom but did feel badly when she killed herself.

## 2012-01-19 NOTE — [facility] Suicide Risk Assessment (Signed)
Suicide Risk Assessment  Discharge Assessment     Demographic factors:  Assessment Details Time of Assessment: Discharge Information Obtained From: Patient Current Mental Status:  Current Mental Status:  (Denies SI/HI) Risk Reduction Factors:  Risk Reduction Factors: Employed;Religious beliefs about death;Sense of responsibility to family;Positive social support;Positive therapeutic relationship  CLINICAL FACTORS:   Alcohol/Substance Abuse/Dependencies  COGNITIVE FEATURES THAT CONTRIBUTE TO RISK:  Polarized thinking    SUICIDE RISK:   Minimal: No identifiable suicidal ideation.  Patients presenting with no risk factors but with morbid ruminations; may be classified as minimal risk based on the severity of the depressive symptoms  PLAN OF CARE: Patient has received detox successfully and placed on appropriate medication. She has plans for out patient substance treatment including AA meetings and [facility] sponsored treatment program for substance abuse and medication management for depression.  Virgel Haro,JANARDHAHA R. 01/19/2012, 12:15 PM

## 2012-01-22 NOTE — Progress Notes (Signed)
Patient Discharge Instructions:  Psychiatric Admission Assessment Note Provided,  01/22/2012 Discharge Summary Note Provided,   01/22/2012 After Visit Summary (AVS) Provided,  01/22/2012 Face Sheet Provided, 01/22/2012 Faxed/Sent to the Next Level Care provider:  01/22/2012 Next Level Care Provider Has Access to the EMR, 01/22/2012  Records provided to [facility] IOP via [facility]/Epic access.  Wandra Scot, 01/22/2012, 11:30 AM

## 2012-03-04 ENCOUNTER — Telehealth: Payer: Self-pay | Admitting: *Deleted

## 2012-03-04 MED ORDER — VALACYCLOVIR HCL 500 MG PO TABS: 500.0000 mg | ORAL_TABLET | Freq: Two times a day (BID) | ORAL | Status: DC | PRN

## 2012-03-04 NOTE — Telephone Encounter (Signed)
Received faxed refill request from pharmacy. Directions from pharmacy read take one tablet by mouth two times a day. This does not match the medication sheet which shows as needed for fever blisters. Last refill #60 on 01/31/12. Please advise.

## 2012-03-04 NOTE — Telephone Encounter (Signed)
Sent!

## 2012-03-10 ENCOUNTER — Other Ambulatory Visit: Payer: Self-pay | Admitting: *Deleted

## 2012-03-10 MED ORDER — METOPROLOL SUCCINATE ER 25 MG PO TB24: 25.0000 mg | ORAL_TABLET | Freq: Two times a day (BID) | ORAL | Status: DC

## 2012-03-27 ENCOUNTER — Other Ambulatory Visit: Payer: Self-pay | Admitting: *Deleted

## 2012-03-27 NOTE — Telephone Encounter (Signed)
Faxed refill request:  Discrepancy in directions for Buspirone HCL.  Our meds list says:  Take 1 tablet by mouth twice daily.  The refill request says: Take one-half (1/2) tablet by mouth twice daily.  Please make correction prior to authorizing refill.

## 2012-03-29 NOTE — Telephone Encounter (Signed)
Call patient, verify current doses, get update on her condition.  Notify me and I'll send the rxs. Thanks. She has OV with me next month.

## 2012-03-30 NOTE — Telephone Encounter (Signed)
Patient states she takes Buspar 1/2 tablet twice daily.  Changed on meds list and on refill request.

## 2012-03-31 MED ORDER — BUSPIRONE HCL 15 MG PO TABS: ORAL_TABLET | ORAL | Status: DC

## 2012-03-31 MED ORDER — FLUOXETINE HCL 20 MG PO TABS: 20.0000 mg | ORAL_TABLET | Freq: Every day | ORAL | Status: DC

## 2012-03-31 NOTE — Telephone Encounter (Signed)
Sent!

## 2012-04-22 ENCOUNTER — Encounter: Payer: Self-pay | Admitting: Family Medicine

## 2012-04-22 ENCOUNTER — Ambulatory Visit (INDEPENDENT_AMBULATORY_CARE_PROVIDER_SITE_OTHER): Payer: BC Managed Care – PPO | Admitting: Family Medicine

## 2012-04-22 VITALS — BP 120/78 | HR 76 | Temp 97.7°F | Wt 216.0 lb

## 2012-04-22 DIAGNOSIS — F329 Major depressive disorder, single episode, unspecified: Secondary | ICD-10-CM

## 2012-04-22 DIAGNOSIS — F1010 Alcohol abuse, uncomplicated: Secondary | ICD-10-CM

## 2012-04-22 DIAGNOSIS — R197 Diarrhea, unspecified: Secondary | ICD-10-CM

## 2012-04-22 DIAGNOSIS — G4700 Insomnia, unspecified: Secondary | ICD-10-CM

## 2012-04-22 DIAGNOSIS — F3289 Other specified depressive episodes: Secondary | ICD-10-CM

## 2012-04-22 MED ORDER — DICYCLOMINE HCL 10 MG PO CAPS: 10.0000 mg | ORAL_CAPSULE | Freq: Three times a day (TID) | ORAL | Status: DC

## 2012-04-22 MED ORDER — ZOLPIDEM TARTRATE 10 MG PO TABS: 5.0000 mg | ORAL_TABLET | Freq: Every evening | ORAL | Status: DC | PRN

## 2012-04-22 NOTE — Patient Instructions (Addendum)
Gradually increase the frequency of the bentyl.  Start with 1 a day and then gradually work up to 4 a day if needed.  Call us with an update in about 2-3 weeks.  Use the ambien as needed and start the exercise routine with your walking partner.  If you have worsening in your mood, notify me.  Recheck in 2 months with Para March, sooner if needed.

## 2012-04-22 NOTE — Progress Notes (Signed)
Episode from 3/13.  Prev ER/BH notes reviewed.  She was drinking more than usual, her mother in law had killed herself, and patient was still struggling with her weight.  She had a falling out with her daughter at the time. She got depressed and was considering suicide.  Was admitted to [facility] for 4 days.  Still drinking now, but less.  She isn't going meetings, "went to a couple."  "Drinking is more habit than anything else."  No Si/Hi.  Her mood is much better, "10 times better than it was."  Work is going well.  She has reconciled with her daughter.  If she taking 5-10mg  of ambien, she does well with that.  She didn't tolerate doxepin for sleep, she was too sleepy the next day.  Her energy level isn't very high or manic.  She isn't impulsive, spending money etc.  If she didn't get a good night of sleep, then she would be tired the next day.  She doesn't give a history of manic episodes.  Her mood overall is improved since the incident, "a lot of things happened at once and I didn't know how to handle it."  The gun is now out of the house.  We talked about coping strategies, specifically not drinking and starting exercise.  She contracts for safety.   Diarrhea after each meal.  No normal BMs in last 2 years.  No blood in stool.  She is also worried about this.  She'll feel bloated slightly but no sig abd pain.  No vomiting.  No fevers.  Known IC, for which she has had recent bladder distension.  Not sob, no CP.  She's trying to get back into more exercise and has a plan for walking with a partner.   PMH and SH reviewed  ROS: See HPI, otherwise noncontributory.  Meds, vitals, and allergies reviewed.   nad ncat Mmm rrr ctab abd soft, minimally ttp in the suprapubic area which is not uncommon for patient, no rebound, normal BS Ext w/o edema Affect bright, judgement and speech are normal.

## 2012-04-23 ENCOUNTER — Encounter: Payer: Self-pay | Admitting: Family Medicine

## 2012-04-23 NOTE — Assessment & Plan Note (Signed)
Presumed IBS, ddx d/wpt.  Would start bentyl in meantime.  She has eliminated potential trigger foods w/o effect, so I doubt celiac disease.  It has been going on for 2 years w/o noted blood in stool or weight loss.  If not improved on bentyl, then refer to GI.  >25 min spent with face to face with patient, >50% counseling and/or coordinating care.

## 2012-04-23 NOTE — Assessment & Plan Note (Signed)
She is improved, drinking less, contracts for safety.  Gun out of house.  Family situation is now better.  Continue current meds.  She'll call back if mood worsens.

## 2012-04-23 NOTE — Assessment & Plan Note (Signed)
D/w pt about cessation.  She doesn't want to continue with meetings.

## 2012-04-23 NOTE — Assessment & Plan Note (Signed)
Restart ambien as this was effective w/o oversedation.

## 2012-05-04 ENCOUNTER — Telehealth: Payer: Self-pay

## 2012-05-04 MED ORDER — POLYMYXIN B-TRIMETHOPRIM 10000-0.1 UNIT/ML-% OP SOLN: 1.0000 [drp] | OPHTHALMIC | Status: AC

## 2012-05-04 NOTE — Telephone Encounter (Signed)
I need this note attached or copied to her daughter's chart and then sent back to me.  Verify name and DOB.  Thanks.

## 2012-05-04 NOTE — Telephone Encounter (Signed)
Pt saw Dr Para March 04/22/12; Now pts daughter has pink eye. Today pt has red eye with crust around right eye, eye itching but no drainage. Pt cannot afford to make appt; pt request med sent to [facility] (pt said Dr Para March has done before).Please advise.

## 2012-05-04 NOTE — Telephone Encounter (Signed)
Sent!

## 2012-05-04 NOTE — Telephone Encounter (Signed)
This is not for the daughter.  It is for the patient.  She says her daughter has been away at college and came home with pink eye and now the patient has the same symptoms.

## 2012-05-04 NOTE — Telephone Encounter (Signed)
Patient advised.

## 2012-06-10 ENCOUNTER — Telehealth: Payer: Self-pay | Admitting: Family Medicine

## 2012-06-10 NOTE — Telephone Encounter (Signed)
Caller: Nava/Patient; PCP: Crawford Givens Clelia Croft); CB#: [phone]; ; ; Call regarding Numbness/Tingling;   LMP 06/02/12. Patient states she developed a "tingling all over". Onset 06/08/12. States tingling sensation was constant until 06/10/12. States sx resolved 06/10/12. Patient states she did not take Dicyclomine 06/09/12 p.m. and thinks that tingling possibly could be related to Dicyclomine. States she has been taking Dicyclomine X 1 month. Denies weakness or paralysis. Denies headache, visual disturbances or slurred speech. States grips strong, gait steady. No difficulty swallowing. Triage per Numbness or Tingling Protocol. No emergent sx identified. Care advice given per guidelines. Patient advised to change positions slowly, increase fluids. Patient advised not to drive. Call back parameters reviewed. Positive Triage for " New or Worsening change in sensation." RN recommended appt. for 06/10/12. Patient declines offered appt. for 06/10/12. States wil call ofice 06/11/12 a.m. for appt.

## 2012-06-10 NOTE — Telephone Encounter (Signed)
Noted  

## 2012-07-03 ENCOUNTER — Other Ambulatory Visit: Payer: Self-pay | Admitting: *Deleted

## 2012-07-03 NOTE — Telephone Encounter (Signed)
Faxed refill request.  Medication is not on meds list, added for refill request.

## 2012-07-05 MED ORDER — BUTALBITAL-ASA-CAFFEINE 50-325-40 MG PO CAPS: 1.0000 | ORAL_CAPSULE | Freq: Two times a day (BID) | ORAL | Status: DC | PRN

## 2012-07-05 NOTE — Telephone Encounter (Signed)
Please call in.  She was due for 2 month f/u this month.  Please get her scheduled.

## 2012-07-06 ENCOUNTER — Other Ambulatory Visit: Payer: Self-pay | Admitting: *Deleted

## 2012-07-06 MED ORDER — ZOLPIDEM TARTRATE 10 MG PO TABS: 5.0000 mg | ORAL_TABLET | Freq: Every evening | ORAL | Status: DC | PRN

## 2012-07-06 NOTE — Telephone Encounter (Signed)
Medication phoned to pharmacy.  LMOVM for patient to schedule OV.

## 2012-07-06 NOTE — Telephone Encounter (Signed)
Call pt and set up OV.  See prev refill note.  Don't send in until f/u is set.

## 2012-07-06 NOTE — Telephone Encounter (Signed)
Faxed refill request   

## 2012-07-07 NOTE — Telephone Encounter (Signed)
LMOVM

## 2012-07-07 NOTE — Telephone Encounter (Signed)
Patient has scheduled appt and the Zolpidem has been phoned to the pharmacy.

## 2012-07-09 ENCOUNTER — Ambulatory Visit: Payer: Self-pay | Admitting: Family Medicine

## 2012-07-09 ENCOUNTER — Ambulatory Visit (INDEPENDENT_AMBULATORY_CARE_PROVIDER_SITE_OTHER): Payer: BC Managed Care – PPO | Admitting: Family Medicine

## 2012-07-09 ENCOUNTER — Encounter: Payer: Self-pay | Admitting: Family Medicine

## 2012-07-09 VITALS — BP 124/76 | HR 82 | Temp 98.2°F | Wt 223.0 lb

## 2012-07-09 DIAGNOSIS — F329 Major depressive disorder, single episode, unspecified: Secondary | ICD-10-CM

## 2012-07-09 DIAGNOSIS — F3289 Other specified depressive episodes: Secondary | ICD-10-CM

## 2012-07-09 DIAGNOSIS — F1010 Alcohol abuse, uncomplicated: Secondary | ICD-10-CM

## 2012-07-09 DIAGNOSIS — G4700 Insomnia, unspecified: Secondary | ICD-10-CM

## 2012-07-09 DIAGNOSIS — N760 Acute vaginitis: Secondary | ICD-10-CM | POA: Insufficient documentation

## 2012-07-09 MED ORDER — LOPERAMIDE HCL 2 MG PO TABS: 2.0000 mg | ORAL_TABLET | Freq: Two times a day (BID) | ORAL | Status: AC | PRN

## 2012-07-09 MED ORDER — FLUCONAZOLE 150 MG PO TABS: 150.0000 mg | ORAL_TABLET | Freq: Once | ORAL | Status: AC

## 2012-07-09 NOTE — Assessment & Plan Note (Signed)
Improved, continue current meds 

## 2012-07-09 NOTE — Assessment & Plan Note (Signed)
Controlled, continue current dose of ambien.

## 2012-07-09 NOTE — Patient Instructions (Addendum)
You can try imodium, up to 2 pills a day.  See if that helps.  Let me know if not improved.  Take the diflucan.   Repeat in 1 week if needed.  Take care.

## 2012-07-09 NOTE — Assessment & Plan Note (Signed)
   Start diflucan

## 2012-07-09 NOTE — Progress Notes (Signed)
Thick white vaginal discharge.  Vaginal itching.  Recent onset. Similar to prev vaginal yeast infections.    IBS are worse in interval.  Bentyl helped some, but she felt tingling in extremities fter about 1 month and didn't know if that was related. She stopped the medicine and the tingling got better.   Mild lower abd pain.  No blood in stool, but she does have some rectal irritation.    Anxiety and depression.  Taking ambien at night, with some relief.  Mood is controlled, she's doing well but school is stressful.  HA are likely stress induced and controlled with meds.  Home situation is good.  Better relationship with daughter now.  Drinking less than prev, she's 'still working on it'.  No Si/Hi.  She got rid of the gun she had prev.  She has a home security system and good neighbors.    Meds, vitals, and allergies reviewed.   ROS: See HPI.  Otherwise, noncontributory.  Bright affect nad ncat Mmm rrr ctab  abd soft, not ttp Ext w/o edema

## 2012-07-09 NOTE — Assessment & Plan Note (Signed)
D/w pt about continued taper.

## 2012-07-15 ENCOUNTER — Telehealth: Payer: Self-pay

## 2012-07-15 NOTE — Telephone Encounter (Signed)
Pt seen 07/09/12; immodium caused constipation; pt did not have BM for 3 days after taking Immodium; pt had BM last night but pt cannot tolerate immodium. Pt request another med for IBS to [facility].Please advise.

## 2012-07-15 NOTE — Telephone Encounter (Signed)
I would break it in half and/or take it 1-2 times a week max.  The other meds may be stronger and cause more problems.

## 2012-07-15 NOTE — Telephone Encounter (Signed)
Patient notified as instructed by telephone. 

## 2012-07-15 NOTE — Telephone Encounter (Signed)
Left message on machine to call back  

## 2012-07-16 ENCOUNTER — Other Ambulatory Visit: Payer: Self-pay | Admitting: *Deleted

## 2012-07-16 MED ORDER — METOPROLOL SUCCINATE ER 25 MG PO TB24: 25.0000 mg | ORAL_TABLET | Freq: Two times a day (BID) | ORAL | Status: DC

## 2012-08-14 ENCOUNTER — Ambulatory Visit (INDEPENDENT_AMBULATORY_CARE_PROVIDER_SITE_OTHER): Payer: BC Managed Care – PPO | Admitting: Family Medicine

## 2012-08-14 ENCOUNTER — Encounter: Payer: Self-pay | Admitting: Family Medicine

## 2012-08-14 VITALS — BP 132/90 | HR 80 | Temp 97.9°F | Wt 219.0 lb

## 2012-08-14 DIAGNOSIS — N898 Other specified noninflammatory disorders of vagina: Secondary | ICD-10-CM

## 2012-08-14 DIAGNOSIS — R300 Dysuria: Secondary | ICD-10-CM

## 2012-08-14 DIAGNOSIS — N72 Inflammatory disease of cervix uteri: Secondary | ICD-10-CM

## 2012-08-14 LAB — POCT URINALYSIS DIPSTICK
Bilirubin, UA: NEGATIVE
Glucose, UA: NEGATIVE
Nitrite, UA: NEGATIVE
Urobilinogen, UA: NEGATIVE

## 2012-08-14 LAB — POCT URINE PREGNANCY: Preg Test, Ur: NEGATIVE

## 2012-08-14 MED ORDER — CEFTRIAXONE SODIUM 1 G IJ SOLR
250.0000 mg | Freq: Once | INTRAMUSCULAR | Status: AC
  Administered 2012-08-14: 250 mg via INTRAMUSCULAR

## 2012-08-14 MED ORDER — AZITHROMYCIN 250 MG PO TABS: ORAL_TABLET | ORAL | Status: DC

## 2012-08-14 NOTE — Progress Notes (Signed)
LMP was end of August.  Unprotected sex 4 weeks ago.  Upreg neg today.  Discolored, foul smelling vaginal discharge and lower abd pain for 2 weeks. No documented fevers, but has some sweats and felt hot.  No vomiting.  IBS is at baseline.  No burning but some frequency with urination.  Some vaginal itching.    Meds, vitals, and allergies reviewed.   ROS: See HPI.  Otherwise, noncontributory.  nad ncat rrr ctab abd soft, not ttp Normal introitus for age, no external lesions except for small nonulcerated papular flesh lesion on R labia, purulent vaginal discharge per os with CMT noted, mucosa pink and moist, no vaginal atrophy, no friaility or hemorrhage, normal uterus size and position, no adnexal masses or tenderness.  Chaperoned exam.

## 2012-08-14 NOTE — Patient Instructions (Addendum)
Go to the lab on the way out.  We'll contact you with your lab report. Get the zithromax from [facility] today and take it tonight.   Take care. If you have uncontrolled abdominal pain or a fever, then go to the ER.

## 2012-08-15 LAB — GC/CHLAMYDIA PROBE AMP, URINE
Chlamydia, Swab/Urine, PCR: NEGATIVE
GC Probe Amp, Urine: NEGATIVE

## 2012-08-16 DIAGNOSIS — N898 Other specified noninflammatory disorders of vagina: Secondary | ICD-10-CM | POA: Insufficient documentation

## 2012-08-16 NOTE — Assessment & Plan Note (Signed)
With unprotected sex, upreg neg.  Treat for GC/Chlam.  No fever and nontoxic, so not suggestive of PID, no indication for IV abx or inpatient tx.  D/w pt about condom use.  See notes on labs.  Wet prep done by MD- no trich, no yeast, no clue cells. >25 min spent with face to face with patient, >50% counseling and/or coordinating care

## 2012-09-09 ENCOUNTER — Other Ambulatory Visit: Payer: Self-pay

## 2012-09-09 MED ORDER — ZOLPIDEM TARTRATE 10 MG PO TABS: 5.0000 mg | ORAL_TABLET | Freq: Every evening | ORAL | Status: DC | PRN

## 2012-09-09 NOTE — Telephone Encounter (Signed)
Please call in

## 2012-09-09 NOTE — Telephone Encounter (Signed)
Pt request refill zolpidem to [facility].Please advise.

## 2012-09-10 NOTE — Telephone Encounter (Signed)
Medication phoned to pharmacy.  

## 2012-10-13 ENCOUNTER — Other Ambulatory Visit: Payer: Self-pay | Admitting: *Deleted

## 2012-10-13 NOTE — Telephone Encounter (Signed)
Pt was given #60 with 2 RF in April.  Please advise.

## 2012-10-14 MED ORDER — VALACYCLOVIR HCL 500 MG PO TABS: 500.0000 mg | ORAL_TABLET | Freq: Two times a day (BID) | ORAL | Status: DC | PRN

## 2012-10-14 NOTE — Telephone Encounter (Signed)
Sent!

## 2012-11-02 ENCOUNTER — Other Ambulatory Visit: Payer: Self-pay | Admitting: *Deleted

## 2012-11-02 MED ORDER — FLUOXETINE HCL 20 MG PO TABS: 20.0000 mg | ORAL_TABLET | Freq: Every day | ORAL | Status: DC

## 2012-11-13 ENCOUNTER — Other Ambulatory Visit: Payer: Self-pay | Admitting: *Deleted

## 2012-11-13 NOTE — Telephone Encounter (Signed)
Faxed refill request. Please advise.  

## 2012-11-15 MED ORDER — ZOLPIDEM TARTRATE 10 MG PO TABS: 5.0000 mg | ORAL_TABLET | Freq: Every evening | ORAL | Status: DC | PRN

## 2012-11-15 NOTE — Telephone Encounter (Signed)
Please call in.  Thanks.   

## 2012-11-16 NOTE — Telephone Encounter (Signed)
Rx phoned to pharmacy.  

## 2012-11-18 ENCOUNTER — Telehealth: Payer: Self-pay

## 2012-11-18 MED ORDER — FLUCONAZOLE 150 MG PO TABS: 150.0000 mg | ORAL_TABLET | Freq: Once | ORAL | Status: DC

## 2012-11-18 NOTE — Telephone Encounter (Signed)
Patient notified

## 2012-11-18 NOTE — Telephone Encounter (Signed)
Pt has white vaginal discharge with vaginal itching. Pt took antibiotic 2 weeks ago from GYN. Pt request med sent to [facility].Please advise.

## 2012-11-18 NOTE — Telephone Encounter (Signed)
Sent diflucan.  F/u prn.  Thanks.

## 2012-11-19 ENCOUNTER — Encounter: Payer: Self-pay | Admitting: Family Medicine

## 2012-11-19 ENCOUNTER — Ambulatory Visit (INDEPENDENT_AMBULATORY_CARE_PROVIDER_SITE_OTHER): Payer: BC Managed Care – PPO | Admitting: Family Medicine

## 2012-11-19 VITALS — BP 122/88 | HR 85 | Temp 98.2°F | Wt 221.0 lb

## 2012-11-19 DIAGNOSIS — R05 Cough: Secondary | ICD-10-CM

## 2012-11-19 DIAGNOSIS — R059 Cough, unspecified: Secondary | ICD-10-CM

## 2012-11-19 MED ORDER — AMOXICILLIN-POT CLAVULANATE 875-125 MG PO TABS: 1.0000 | ORAL_TABLET | Freq: Two times a day (BID) | ORAL | Status: DC

## 2012-11-19 MED ORDER — BENZONATATE 200 MG PO CAPS: 200.0000 mg | ORAL_CAPSULE | Freq: Three times a day (TID) | ORAL | Status: AC | PRN

## 2012-11-19 NOTE — Assessment & Plan Note (Signed)
Likely from postnasal gtt and sinusitis. Start augmentin, use tessalon for cough and f/u prn.  She agrees.  Nontoxic.  Supportive tx o/w.

## 2012-11-19 NOTE — Progress Notes (Signed)
Had been sick for about 2 weeks.  Was prev on abx per GYN clinic (trich- seen at gyn clinic and was on metronidazole, this is resolved).  Now with likely yeast infection and getting ready to pick the diflucan at pharmacy. Now fatigued.  Diffuse aches.  Some sputum initially clear, now discolored sputum though cough is occ dry. Chest is tight and cough is exhausting.  Chest is sore.  No fevers but she usually doesn't have a fever with colds.  No ear pain.  ST, she attributes to the cough.  Rhinorrhea continues.  Had a flu shot.    H/o tachyarrhythmia noted.  Never on SABA prev.    Meds, vitals, and allergies reviewed.   ROS: See HPI.  Otherwise, noncontributory.  GEN: nad, alert and oriented HEENT: mucous membranes moist, tm w/o erythema, nasal exam w/o erythema, clear discharge noted,  OP with cobblestoning, sinuses ttp x4 NECK: supple w/o LA CV: rrr.   PULM: ctab, no inc wob, cough noted EXT: no edema SKIN: no acute rash

## 2012-11-19 NOTE — Patient Instructions (Addendum)
Take tessalon for the cough and start the augmentin today.  Take care.  Drink plenty of fluids and get some rest.

## 2012-12-12 DIAGNOSIS — T783XXA Angioneurotic edema, initial encounter: Secondary | ICD-10-CM

## 2012-12-12 HISTORY — DX: Angioneurotic edema, initial encounter: T78.3XXA

## 2012-12-14 ENCOUNTER — Other Ambulatory Visit: Payer: Self-pay | Admitting: *Deleted

## 2012-12-14 MED ORDER — BUSPIRONE HCL 15 MG PO TABS: ORAL_TABLET | ORAL | Status: DC

## 2012-12-30 ENCOUNTER — Emergency Department (HOSPITAL_COMMUNITY)
Admission: EM | Admit: 2012-12-30 | Discharge: 2012-12-30 | Disposition: A | Discharge status: Home / Self Care | Payer: BC Managed Care – PPO | Attending: Emergency Medicine | Admitting: Emergency Medicine

## 2012-12-30 ENCOUNTER — Encounter (HOSPITAL_COMMUNITY): Payer: Self-pay | Admitting: *Deleted

## 2012-12-30 DIAGNOSIS — R209 Unspecified disturbances of skin sensation: Secondary | ICD-10-CM | POA: Insufficient documentation

## 2012-12-30 DIAGNOSIS — T628X1A Toxic effect of other specified noxious substances eaten as food, accidental (unintentional), initial encounter: Secondary | ICD-10-CM | POA: Insufficient documentation

## 2012-12-30 DIAGNOSIS — Y9389 Activity, other specified: Secondary | ICD-10-CM | POA: Insufficient documentation

## 2012-12-30 DIAGNOSIS — Z8781 Personal history of (healed) traumatic fracture: Secondary | ICD-10-CM | POA: Insufficient documentation

## 2012-12-30 DIAGNOSIS — F411 Generalized anxiety disorder: Secondary | ICD-10-CM | POA: Insufficient documentation

## 2012-12-30 DIAGNOSIS — T783XXA Angioneurotic edema, initial encounter: Secondary | ICD-10-CM | POA: Insufficient documentation

## 2012-12-30 DIAGNOSIS — F3289 Other specified depressive episodes: Secondary | ICD-10-CM | POA: Insufficient documentation

## 2012-12-30 DIAGNOSIS — T7840XA Allergy, unspecified, initial encounter: Secondary | ICD-10-CM

## 2012-12-30 DIAGNOSIS — Z87448 Personal history of other diseases of urinary system: Secondary | ICD-10-CM | POA: Insufficient documentation

## 2012-12-30 DIAGNOSIS — G4700 Insomnia, unspecified: Secondary | ICD-10-CM | POA: Insufficient documentation

## 2012-12-30 DIAGNOSIS — Y9229 Other specified public building as the place of occurrence of the external cause: Secondary | ICD-10-CM | POA: Insufficient documentation

## 2012-12-30 DIAGNOSIS — Z8679 Personal history of other diseases of the circulatory system: Secondary | ICD-10-CM | POA: Insufficient documentation

## 2012-12-30 LAB — POCT I-STAT, CHEM 8
Calcium, Ion: 1.19 mmol/L (ref 1.12–1.23)
Creatinine, Ser: 0.8 mg/dL (ref 0.50–1.10)
Glucose, Bld: 136 mg/dL — ABNORMAL HIGH (ref 70–99)
Potassium: 3.6 mEq/L (ref 3.5–5.1)
Sodium: 139 mEq/L (ref 135–145)
TCO2: 23 mmol/L (ref 0–100)

## 2012-12-30 MED ORDER — EPINEPHRINE 0.3 MG/0.3ML IJ DEVI
INTRAMUSCULAR | Status: AC
  Filled 2012-12-30: qty 0.3

## 2012-12-30 MED ORDER — PREDNISONE 50 MG PO TABS: 50.0000 mg | ORAL_TABLET | Freq: Every day | ORAL | Status: DC

## 2012-12-30 MED ORDER — DIPHENHYDRAMINE HCL 50 MG/ML IJ SOLN
25.0000 mg | Freq: Once | INTRAMUSCULAR | Status: AC
  Administered 2012-12-30: 25 mg via INTRAVENOUS
  Filled 2012-12-30: qty 1

## 2012-12-30 MED ORDER — EPINEPHRINE HCL 0.1 MG/ML IJ SOLN
0.1000 mg | Freq: Once | INTRAMUSCULAR | Status: AC
  Administered 2012-12-30: 0.1 mg via INTRAVENOUS
  Filled 2012-12-30: qty 10

## 2012-12-30 MED ORDER — TRAMADOL HCL 50 MG PO TABS
50.0000 mg | ORAL_TABLET | Freq: Once | ORAL | Status: AC
  Administered 2012-12-30: 50 mg via ORAL
  Filled 2012-12-30: qty 1

## 2012-12-30 MED ORDER — EPINEPHRINE 0.3 MG/0.3ML IJ DEVI: 0.3000 mg | INTRAMUSCULAR | Status: DC | PRN

## 2012-12-30 MED ORDER — DIPHENHYDRAMINE HCL 25 MG PO TABS: 25.0000 mg | ORAL_TABLET | Freq: Four times a day (QID) | ORAL | Status: DC

## 2012-12-30 NOTE — ED Notes (Addendum)
No rash noted. Pt swallowing saliva without difficulty at this time. respirations wnl. Talking in complete sentences. States she feels like tongue is swelling more, edp made aware, orders received.

## 2012-12-30 NOTE — ED Notes (Signed)
Pt ambulated to the restroom. NAD.

## 2012-12-30 NOTE — ED Notes (Signed)
MD at bedside. 

## 2012-12-30 NOTE — ED Notes (Signed)
Pt reports feeling better post dose of medication.  "I can swallow normal now."  Pt talking in complete sentences, no signs of distress noted.

## 2012-12-30 NOTE — ED Notes (Signed)
Pt became mildly anxious after epi administration, RN stayed at bedside. Vitals stable.

## 2012-12-30 NOTE — ED Notes (Addendum)
EMS-pt reports had subway around 1415 then approx 1445 started having tingling to tongue and swelling to throat. Pt reports no hx of allergies. Airway is intact. Breathing WNL. Pt was given 0.15epi, zantac, benadryl, and solumedrol en route. Pt states she still feels like she has swelling to tongue and mild difficulty swallowing. No rash noted. States coworkers stated she was flushed.

## 2012-12-30 NOTE — ED Notes (Signed)
Pt provided with water to drink.  No distress noted.  VSS.

## 2012-12-30 NOTE — ED Provider Notes (Signed)
History  This chart was scribed for Tricia Racer, MD by Shari Heritage, ED Scribe. The patient was seen in room B18C/B18C. Patient's care was started at 1530.   CSN: [id number]  Arrival date & time 12/30/12  1529   First MD Initiated Contact with Patient 12/30/12 1530      Chief Complaint  Patient presents with  . Allergic Reaction     Patient is a 47 y.o. female presenting with allergic reaction. The history is provided by the patient. No language interpreter was used.  Allergic Reaction The primary symptoms are  angioedema. The primary symptoms do not include shortness of breath. The current episode started less than 1 hour ago.  The angioedema began less than 1 hour ago. The angioedema has been gradually worsening since its onset. It is a new problem. It is located on the tongue. The angioedema is not associated with shortness of breath or stridor.  Significant symptoms that are not present include itching.     HPI Comments: Tricia [name] is a 47 y.o. female who presents to the Emergency Department complaining of sudden lip and tongue swelling and trouble swallowing onset 45 minutes ago. Patient states that she ate [employer] for lunch 1 hour and 15 minutes ago, then 30 minutes later, she began experiencing swelling and lip numbness. Patient was given 0.15 epinephrine, Zantac, Benadryl and Solu-Medrol en route via EMS. She says that she didn't have any new or unusual foods on the sandwich. She denies history of allergic reactions or known allergies to any foods. Patient has a medical history of anxiety and cardiomyopathy.   Past Medical History  Diagnosis Date  . Anxiety     Panic  . Interstitial cystitis     per [facility]  . Cardiomyopathy     EF 45% by myoview, 45-50% by echo, 50% by MRI. ? PVC-associated cardiomyopathy  . Chest pain     ETT myoview 4/11) 6'15", frequent PVCs and run NSVT, mild global HK with EF 45%, fixed anterior defect was likely soft tissue attenuation  so no evidence for ischemia or infarction.  . Abnormal MRI, cervical spine 08/2010    with uncovertebral disease on the left at C6-7 encroaching on the left C7 root.  . Toe fracture, right 11/04/11    2,3rd toes  . Insomnia     uses ambien  . Premature ventricular contractions     resolved with metoprolol  . Depression     suicidal thoughts 2013, with [facility] eval    Past Surgical History  Procedure Laterality Date  . Ganglion cyst excision  2011    Dr. Teressa Senter  . C6-7 fusion  2011    Dr. [name]  . Cysto with hydrodistension  11/29/2011    Procedure: CYSTOSCOPY/HYDRODISTENSION;  Surgeon: Kathi Ludwig, MD;  Location: [facility];  Service: Urology;  Laterality: N/A;  M & P M & K  . Back surgery      Family History  Problem Relation Age of Onset  . Heart disease Maternal Grandfather 64    CABG  . Heart disease Cousin     Aortic dissection    History  Substance Use Topics  . Smoking status: Never Smoker   . Smokeless tobacco: Not on file  . Alcohol Use: Yes     Comment: daily    OB History   Grav Para Term Preterm Abortions TAB SAB Ect Mult Living  Review of Systems  Respiratory: Negative for chest tightness, shortness of breath and stridor.   Cardiovascular: Negative for chest pain.  Skin: Negative for itching.  Neurological: Positive for numbness.  All other systems reviewed and are negative.    Allergies  Bentyl and Clarithromycin  Home Medications   Current Outpatient Rx  Name  Route  Sig  Dispense  Refill  . amoxicillin-clavulanate (AUGMENTIN) 875-125 MG per tablet   Oral   Take 1 tablet by mouth 2 (two) times daily.   20 tablet   0   . busPIRone (BUSPAR) 15 MG tablet      Take one half tablet (7.5 mg) by mouth two times daily.   90 tablet   0   . drospirenone-ethinyl estradiol (OCELLA) 3-0.03 MG tablet   Oral   Take 1 tablet by mouth daily.         . fluconazole (DIFLUCAN) 150 MG tablet   Oral   Take  1 tablet (150 mg total) by mouth once. Repeat in 1 week if needed.   2 tablet   0   . FLUoxetine (PROZAC) 20 MG tablet   Oral   Take 1 tablet (20 mg total) by mouth daily.   90 tablet   0   . metoprolol succinate (TOPROL-XL) 25 MG 24 hr tablet   Oral   Take 1 tablet (25 mg total) by mouth 2 (two) times daily.   60 tablet   12   . valACYclovir (VALTREX) 500 MG tablet   Oral   Take 1 tablet (500 mg total) by mouth 2 (two) times daily as needed. For fever blisters   60 tablet   2   . zolpidem (AMBIEN) 10 MG tablet   Oral   Take 0.5-1 tablets (5-10 mg total) by mouth at bedtime as needed for sleep.   30 tablet   1     Triage Vitals: BP 130/92  Pulse 83  Temp(Src) 98.3 F (36.8 C) (Oral)  Resp 17  SpO2 100%  Physical Exam  Nursing note and vitals reviewed. Constitutional: She is oriented to person, place, and time. She appears well-developed and well-nourished. No distress.  HENT:  Head: Normocephalic and atraumatic.  Mouth/Throat: Oropharynx is clear and moist. No posterior oropharyngeal edema.  Swelling to tongue and subglottic area. No oropharyngeal swelling.  Eyes: Conjunctivae and EOM are normal. Pupils are equal, round, and reactive to light.  Neck: Normal range of motion. Neck supple.  Cardiovascular: Normal rate, regular rhythm and intact distal pulses.   Pulmonary/Chest: Effort normal and breath sounds normal. No stridor. No respiratory distress. She has no wheezes.  Abdominal: Soft.  Musculoskeletal: She exhibits no edema and no tenderness.  Neurological: She is alert and oriented to person, place, and time. She has normal strength. No sensory deficit. She displays no seizure activity.  5/5 strength throughout.  Skin: Skin is warm and dry. No rash noted.  Psychiatric: She has a normal mood and affect. Her behavior is normal.    ED Course  Procedures (including critical care time) DIAGNOSTIC STUDIES: Oxygen Saturation is 100% on room air, normal by my  interpretation.    COORDINATION OF CARE: 3:39 PM- Patient informed of current plan for treatment and evaluation and agrees with plan at this time.   3:45 PM- Patient states symptoms are worsening so will give 0.1 mg of epinephrine and 25 mg of Benadryl.   3:59 PM- Mildly anxious and tremulous after medicine, but states tongue and mouth swelling have  improved. Speaking in complete sentences.  6:21 PM- Patient is significantly improved. Tolerating POs and is ready to go home. Tongue swelling is improved. No subglottic swelling. Will discharge home with prescriptions for Epipen, Deltasone, and Benadryl. Encouraged to return immediately for increased swelling or shortness of breath. She verbalizes understanding.   Labs Reviewed  POCT I-STAT, CHEM 8 - Abnormal; Notable for the following:    Glucose, Bld 136 (*)    All other components within normal limits     No diagnosis found.    MDM  I personally performed the services described in this documentation, which was scribed in my presence. The recorded information has been reviewed and is accurate.    Tricia Racer, MD 12/30/12 [phone]

## 2013-01-13 ENCOUNTER — Other Ambulatory Visit: Payer: Self-pay | Admitting: *Deleted

## 2013-01-13 MED ORDER — ZOLPIDEM TARTRATE 10 MG PO TABS: 5.0000 mg | ORAL_TABLET | Freq: Every evening | ORAL | Status: DC | PRN

## 2013-01-13 NOTE — Telephone Encounter (Signed)
Last filled 12/14/12. 

## 2013-01-13 NOTE — Telephone Encounter (Signed)
Please call in

## 2013-01-14 NOTE — Telephone Encounter (Signed)
Medication phoned to pharmacy.  

## 2013-01-18 NOTE — Telephone Encounter (Signed)
Pt called to ck status of refill. Advised called to [facility] on 01/14/13;unable to reach [facility] by phone to verify but pt will go to pharmacy.

## 2013-03-29 ENCOUNTER — Other Ambulatory Visit: Payer: Self-pay | Admitting: *Deleted

## 2013-03-29 ENCOUNTER — Encounter: Payer: Self-pay | Admitting: Family Medicine

## 2013-03-29 ENCOUNTER — Telehealth: Payer: Self-pay | Admitting: *Deleted

## 2013-03-29 ENCOUNTER — Ambulatory Visit (INDEPENDENT_AMBULATORY_CARE_PROVIDER_SITE_OTHER): Payer: BC Managed Care – PPO | Admitting: Family Medicine

## 2013-03-29 VITALS — BP 130/90 | HR 85 | Temp 97.7°F | Wt 221.0 lb

## 2013-03-29 DIAGNOSIS — F3289 Other specified depressive episodes: Secondary | ICD-10-CM

## 2013-03-29 DIAGNOSIS — R51 Headache: Secondary | ICD-10-CM

## 2013-03-29 DIAGNOSIS — F329 Major depressive disorder, single episode, unspecified: Secondary | ICD-10-CM

## 2013-03-29 DIAGNOSIS — N390 Urinary tract infection, site not specified: Secondary | ICD-10-CM

## 2013-03-29 DIAGNOSIS — R350 Frequency of micturition: Secondary | ICD-10-CM

## 2013-03-29 LAB — POCT URINALYSIS DIPSTICK
Bilirubin, UA: NEGATIVE
Ketones, UA: NEGATIVE
Spec Grav, UA: >=1.03
pH, UA: 6.5

## 2013-03-29 MED ORDER — SULFAMETHOXAZOLE-TRIMETHOPRIM 800-160 MG PO TABS: 1.0000 | ORAL_TABLET | Freq: Two times a day (BID) | ORAL | Status: DC

## 2013-03-29 MED ORDER — BUTALBITAL-ASA-CAFFEINE 50-325-40 MG PO CAPS: 1.0000 | ORAL_CAPSULE | Freq: Two times a day (BID) | ORAL | Status: AC | PRN

## 2013-03-29 MED ORDER — BUTALBITAL-ASA-CAFFEINE 50-325-40 MG PO CAPS: 1.0000 | ORAL_CAPSULE | Freq: Two times a day (BID) | ORAL | Status: DC | PRN

## 2013-03-29 NOTE — Assessment & Plan Note (Signed)
[facility], improved.

## 2013-03-29 NOTE — Patient Instructions (Addendum)
Drink plenty of water and start the antibiotics today.  We'll contact you with your lab report.  Take care.   

## 2013-03-29 NOTE — Telephone Encounter (Signed)
Medication phoned to pharmacy.  

## 2013-03-29 NOTE — Assessment & Plan Note (Signed)
Presumed, check ucx, start septra.  Fu prn.

## 2013-03-29 NOTE — Telephone Encounter (Signed)
Medication phoned in to pharmacy per Dr. Lianne Bushy request.

## 2013-03-29 NOTE — Telephone Encounter (Signed)
Message copied by Annamarie Major on Mon Mar 29, 2013  1:30 PM ------      Message from: [name]      Created: Mon Mar 29, 2013  1:09 PM       Call in fiorinal and notify pt.  Thanks.  ------

## 2013-03-29 NOTE — Assessment & Plan Note (Signed)
Short course of prn fiorinal and fu prn.

## 2013-03-29 NOTE — Progress Notes (Signed)
She went to psych and had f/u with med changes and she is improved.   Dysuria: yes, odor and pain duration of symptoms: weeks abdominal pain: suprapubic pain Fevers:no back pain: lower back pain Vomiting:no No vaginal discharge.   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  Had use fiorinal prev, episodically with some relief, asking for a refill.   She had had some IBS sx.  We discussed GI referral.  She'll check on this and then will notify us if referral is needed (pt preference for this arrangement)  Meds, vitals, and allergies reviewed.   ROS: See HPI.  Otherwise negative.    GEN: nad, alert and oriented HEENT: mucous membranes moist NECK: supple CV: rrr.  PULM: ctab, no inc wob ABD: soft, +bs, suprapubic area tender EXT: no edema SKIN: no acute rash BACK: no CVA pain

## 2013-03-30 LAB — URINE CULTURE
Colony Count: NO GROWTH
Organism ID, Bacteria: NO GROWTH

## 2013-03-31 ENCOUNTER — Encounter: Payer: Self-pay | Admitting: Internal Medicine

## 2013-04-26 ENCOUNTER — Encounter: Payer: Self-pay | Admitting: Internal Medicine

## 2013-04-26 ENCOUNTER — Ambulatory Visit (INDEPENDENT_AMBULATORY_CARE_PROVIDER_SITE_OTHER): Payer: BC Managed Care – PPO | Admitting: Internal Medicine

## 2013-04-26 ENCOUNTER — Other Ambulatory Visit (INDEPENDENT_AMBULATORY_CARE_PROVIDER_SITE_OTHER): Payer: BC Managed Care – PPO

## 2013-04-26 VITALS — BP 110/80 | HR 80 | Ht 68.0 in | Wt 224.2 lb

## 2013-04-26 DIAGNOSIS — N3010 Interstitial cystitis (chronic) without hematuria: Secondary | ICD-10-CM

## 2013-04-26 DIAGNOSIS — F1010 Alcohol abuse, uncomplicated: Secondary | ICD-10-CM

## 2013-04-26 DIAGNOSIS — K529 Noninfective gastroenteritis and colitis, unspecified: Secondary | ICD-10-CM

## 2013-04-26 DIAGNOSIS — R197 Diarrhea, unspecified: Secondary | ICD-10-CM

## 2013-04-26 LAB — IGA: IgA: 240 mg/dL (ref 68–378)

## 2013-04-26 MED ORDER — NA SULFATE-K SULFATE-MG SULF 17.5-3.13-1.6 GM/177ML PO SOLN: ORAL | Status: DC

## 2013-04-26 NOTE — Assessment & Plan Note (Signed)
Advised to reduce to not more than 1 drink a day

## 2013-04-26 NOTE — Patient Instructions (Addendum)
You have been scheduled for a colonoscopy with propofol. Please follow written instructions given to you at your visit today.  Please pick up your prep kit at the pharmacy within the next 1-3 days. If you use inhalers (even only as needed), please bring them with you on the day of your procedure. Your physician has requested that you go to www.startemmi.com and enter the access code given to you at your visit today. This web site gives a general overview about your procedure. However, you should still follow specific instructions given to you by our office regarding your preparation for the procedure.  Your physician has requested that you go to the basement for the following lab work before leaving today: TTG, IGA  Please try to limit your alcohol consumption to one drink a day.  Please set up an appointment to see Dr. Patsi Sears for your interstitial cystitis.   I appreciate the opportunity to care for you.

## 2013-04-26 NOTE — Progress Notes (Signed)
Subjective:    Patient ID: Tricia [name], female    DOB: [birth year], 47 y.o.   MRN: [id number]  HPI Is a very pleasant the latest middle-aged white woman who has had diarrhea problems with urgent postprandial defecation associated with urge incontinence at times. She is always had irregular bowel habits but says that for the past 2 years this is been worsening, she notices some slimy yellowish-green substance on the top and sometimes there is some burning pain with defecation. She has tried Imodium AD but that actually promote constipation for about a week or so it makes her feel worse. She is actually gaining weight though attributes that to change in antidepressants, noticing weight increasing since being on Celexa for 2 months. She does not have rectal bleeding. He does have a fairly constant bilateral lower quadrant abdominal pain problem. She gets some of that with interstitial cystitis. Last bladder distention was in 2013, early in the year and didn't help.  She does not use caffeine. She does drink alcohol too on average a day. She is not noticed any problems related to that her new medications with respect to her diarrhea, she does not use sugar-free substances and she does not tend to drink milk or eats dairy products.  She had an allergic reaction to something is presumed, having had angioedema of the lips and tongue after eating in a [employer] in February, she carries and EpiPen now but has not had an allergy evaluation because it was only a one-time isolated instance. Allergies  Allergen Reactions  . Bentyl (Dicyclomine Hcl)     tingling  . Clarithromycin     REACTION: nausea and vomiting   Outpatient Prescriptions Prior to Visit  Medication Sig Dispense Refill  . busPIRone (BUSPAR) 15 MG tablet Take 15 mg by mouth 2 (two) times daily.       . citalopram (CELEXA) 40 MG tablet Take 40 mg by mouth daily.      . drospirenone-ethinyl estradiol (OCELLA) 3-0.03 MG tablet Take 1 tablet by  mouth daily.      Marland Kitchen EPINEPHrine (EPIPEN) 0.3 mg/0.3 mL DEVI Inject 0.3 mLs (0.3 mg total) into the muscle as needed.  1 Device  0  . metoprolol succinate (TOPROL-XL) 50 MG 24 hr tablet Take 50 mg by mouth daily. Take with or immediately following a meal.      . valACYclovir (VALTREX) 500 MG tablet Take 500 mg by mouth daily. For fever blisters      . zolpidem (AMBIEN CR) 12.5 MG CR tablet Take 12.5 mg by mouth at bedtime as needed for sleep.      Marland Kitchen sulfamethoxazole-trimethoprim (BACTRIM DS,SEPTRA DS) 800-160 MG per tablet Take 1 tablet by mouth 2 (two) times daily.  6 tablet  0   No facility-administered medications prior to visit.   Past Medical History  Diagnosis Date  . Anxiety     Panic  . Interstitial cystitis     per [facility]  . Cardiomyopathy     EF 45% by myoview, 45-50% by echo, 50% by MRI. ? PVC-associated cardiomyopathy  . Chest pain     ETT myoview 4/11) 6'15", frequent PVCs and run NSVT, mild global HK with EF 45%, fixed anterior defect was likely soft tissue attenuation so no evidence for ischemia or infarction.  . Abnormal MRI, cervical spine 08/2010    with uncovertebral disease on the left at C6-7 encroaching on the left C7 root.  . Toe fracture, right 11/04/11  2,3rd toes  . Insomnia     uses ambien  . Premature ventricular contractions     resolved with metoprolol  . Depression     suicidal thoughts 2013, with [facility] eval  . Angioedema 12/2012    ? food allergy - ED eval   Past Surgical History  Procedure Laterality Date  . Ganglion cyst excision  2011    Dr. Teressa Senter  . C6-7 fusion  2011    Dr. [name]  . Cysto with hydrodistension  11/29/2011    Procedure: CYSTOSCOPY/HYDRODISTENSION;  Surgeon: Kathi Ludwig, MD;  Location: [facility];  Service: Urology;  Laterality: N/A;  M & P M & K  . Back surgery     History   Social History  . Marital Status: Legally Separated    Spouse Name: N/A    Number of Children: 1  . Years of  Education: N/A   Occupational History  . Registrar at [employer]    Social History Main Topics  . Smoking status: Never Smoker   . Smokeless tobacco: Never Used  . Alcohol Use: Yes     Comment: daily  . Drug Use: No  . Sexually Active: Yes    Birth Control/ Protection: Condom   Other Topics Concern  . None   Social History Narrative   Lives in [place]   Family History  Problem Relation Age of Onset  . Heart disease Maternal Grandfather 5    CABG  . Heart disease Cousin     Aortic dissection    Review of Systems Over the weekend she fell off a lounge chair at the pole and has abrasions on her for head. Review of systems also positive for anxiety, depression fatigue, some back pain, headaches insomnia and urinary frequency and some urge incontinence leakage problems. All other review of systems negative or as per history of present illness.    Objective:   Physical Exam General:  Well-developed, well-nourished and in no acute distress, overweight to obese Eyes:  anicteric. ENT:   Mouth and posterior pharynx free of lesions.  Neck:   supple w/o thyromegaly or mass.  Lungs: Clear to auscultation bilaterally. Heart:  S1S2, no rubs, murmurs, gallops. Abdomen:  soft, mildly tender in lower quadrants to deep palpation, no hepatosplenomegaly, hernia, or mass and BS+.  Rectal: Deferred until colonoscopy Lymph:  no cervical or supraclavicular adenopathy. Extremities:   no edema Skin   no rash. Multiple tattoos, there are 2 abraded areas oval, healing on the 4 head on the right side Neuro:  A&O x 3.  Psych:  appropriate mood and  Affect.   Data Reviewed: Lab Results  Component Value Date   WBC 8.1 01/16/2012   HGB 12.2 12/30/2012   HCT 36.0 12/30/2012   MCV 97.3 01/16/2012   PLT 308 01/16/2012     Chemistry      Component Value Date/Time   NA 139 12/30/2012 1627   K 3.6 12/30/2012 1627   CL 108 12/30/2012 1627   CO2 27 01/16/2012 2012   BUN 13 12/30/2012 1627    CREATININE 0.80 12/30/2012 1627      Component Value Date/Time   CALCIUM 9.5 01/16/2012 2012   ALKPHOS 56 01/16/2012 2012   AST 19 01/16/2012 2012   ALT 5 01/16/2012 2012   BILITOT 0.2* 01/16/2012 2012     ER visit 12/30/2012     Assessment & Plan:   1. 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 Alcohol abuse, daily use   3. Interstitial cystitis    1. Cause of her problems statistically would most likely be IBS but severe and has been worsening. Her mother has collagenous colitis. Lost B. will be scheduled to look for possible inflammatory bowel disease, colorectal neoplasia though that seems less likely. 2. The risks and benefits as well as alternatives of endoscopic procedure(s) have been discussed and reviewed. All questions answered. The patient agrees to proceed. Will screen for celiac disease with TTG antibody and IgA level I've advised her to have a maximum average daily alcohol consumption of one per day I've also advised her to schedule a followup visit with Dr. Patsi Sears her interstitial cystitis as her chronic lower abdominal pain may be later interstitial cystitis as well.  CC: Crawford Givens, MD and Levan Hurst, MD

## 2013-04-27 LAB — TISSUE TRANSGLUTAMINASE, IGA: Tissue Transglutaminase Ab, IgA: 3.1 U/mL (ref <20)

## 2013-04-27 NOTE — Progress Notes (Signed)
Quick Note:  No celiac dz ______

## 2013-05-07 ENCOUNTER — Encounter: Payer: Self-pay | Admitting: Internal Medicine

## 2013-05-07 ENCOUNTER — Ambulatory Visit (AMBULATORY_SURGERY_CENTER): Payer: BC Managed Care – PPO | Admitting: Internal Medicine

## 2013-05-07 VITALS — BP 125/78 | HR 71 | Temp 98.8°F | Resp 22 | Ht 68.0 in | Wt 224.0 lb

## 2013-05-07 DIAGNOSIS — R197 Diarrhea, unspecified: Secondary | ICD-10-CM

## 2013-05-07 HISTORY — PX: COLONOSCOPY: SHX174

## 2013-05-07 MED ORDER — SODIUM CHLORIDE 0.9 % IV SOLN: 500.0000 mL | INTRAVENOUS | Status: DC

## 2013-05-07 NOTE — Progress Notes (Signed)
Called to room to assist during endoscopic procedure.  Patient ID and intended procedure confirmed with present staff. Received instructions for my participation in the procedure from the performing physician.  

## 2013-05-07 NOTE — Progress Notes (Signed)
Patient did not experience any of the following events: a burn prior to discharge; a fall within the facility; wrong site/side/patient/procedure/implant event; or a hospital transfer or hospital admission upon discharge from the facility. (G8907) Patient did not have preoperative order for IV antibiotic SSI prophylaxis. (G8918)  

## 2013-05-07 NOTE — Progress Notes (Signed)
Pt. Became teary after 2 unsuccessful attempts to starting I.V. And stated she get anxious when they don't get the I.V. On the first attempt,but stated she was ok. Held pt hand and stayed with her and explained that sometimes people become dehydrated and it becomes more difficult to obtain I.V. Pt. Stated that it did not hurt each time that I attempted to place I.V.

## 2013-05-07 NOTE — Patient Instructions (Addendum)
Everything looked ok. I took biopsies to see if you have microscopic colitis.  We will call - should be next week.  I appreciate the opportunity to care for you. Iva Boop, MD, FACG  YOU HAD AN ENDOSCOPIC PROCEDURE TODAY AT THE [facility]: Refer to the procedure report that was given to you for any specific questions about what was found during the examination.  If the procedure report does not answer your questions, please call your gastroenterologist to clarify.  If you requested that your care partner not be given the details of your procedure findings, then the procedure report has been included in a sealed envelope for you to review at your convenience later.  YOU SHOULD EXPECT: Some feelings of bloating in the abdomen. Passage of more gas than usual.  Walking can help get rid of the air that was put into your GI tract during the procedure and reduce the bloating. If you had a lower endoscopy (such as a colonoscopy or flexible sigmoidoscopy) you may notice spotting of blood in your stool or on the toilet paper. If you underwent a bowel prep for your procedure, then you may not have a normal bowel movement for a few days.  DIET: Your first meal following the procedure should be a light meal and then it is ok to progress to your normal diet.  A half-sandwich or bowl of soup is an example of a good first meal.  Heavy or fried foods are harder to digest and may make you feel nauseous or bloated.  Likewise meals heavy in dairy and vegetables can cause extra gas to form and this can also increase the bloating.  Drink plenty of fluids but you should avoid alcoholic beverages for 24 hours.  ACTIVITY: Your care partner should take you home directly after the procedure.  You should plan to take it easy, moving slowly for the rest of the day.  You can resume normal activity the day after the procedure however you should NOT DRIVE or use heavy machinery for 24 hours (because of the sedation  medicines used during the test).    SYMPTOMS TO REPORT IMMEDIATELY: A gastroenterologist can be reached at any hour.  During normal business hours, 8:30 AM to 5:00 PM Monday through Friday, call [phone].  After hours and on weekends, please call the GI answering service at [phone] who will take a message and have the physician on call contact you.   Following lower endoscopy (colonoscopy or flexible sigmoidoscopy):  Excessive amounts of blood in the stool  Significant tenderness or worsening of abdominal pains  Swelling of the abdomen that is new, acute  Fever of 100F or higher FOLLOW UP: If any biopsies were taken you will be contacted by phone or by letter within the next 1-3 weeks.  Call your gastroenterologist if you have not heard about the biopsies in 3 weeks.  Our staff will call the home number listed on your records the next business day following your procedure to check on you and address any questions or concerns that you may have at that time regarding the information given to you following your procedure. This is a courtesy call and so if there is no answer at the home number and we have not heard from you through the emergency physician on call, we will assume that you have returned to your regular daily activities without incident.  SIGNATURES/CONFIDENTIALITY: You and/or your care partner have signed paperwork which will be entered  into your electronic medical record.  These signatures attest to the fact that that the information above on your After Visit Summary has been reviewed and is understood.  Full responsibility of the confidentiality of this discharge information lies with you and/or your care-partner.   Normal colonoscopy

## 2013-05-07 NOTE — Op Note (Signed)
[facility] [address] [place], [address]   COLONOSCOPY PROCEDURE REPORT  PATIENT: Tricia [name], Tricia [name]  MR#: [id number] BIRTHDATE: [birth year] , 47  yrs. old GENDER: Female ENDOSCOPIST: Iva Boop, MD, [facility] REFERRED XB:JYNWGN Para March, M.D. PROCEDURE DATE:  05/07/2013 PROCEDURE:   Colonoscopy with biopsy ASA CLASS:   Class II INDICATIONS:chronic diarrhea. MEDICATIONS: propofol (Diprivan) 450mg  IV, MAC sedation, administered by CRNA, and These medications were titrated to patient response per physician's verbal order  DESCRIPTION OF PROCEDURE:   After the risks benefits and alternatives of the procedure were thoroughly explained, informed consent was obtained.  A digital rectal exam revealed no abnormalities of the rectum.   The LB [id number]  endoscope was introduced through the anus and advanced to the cecum, which was identified by both the appendix and ileocecal valve. No adverse events experienced.   The quality of the prep was Suprep good  The instrument was then slowly withdrawn as the colon was fully examined.      COLON FINDINGS: The mucosa appeared normal in the terminal ileum. The colonic mucosa appeared normal throughout the entire examined colon.  Multiple random biopsies of the area were performed.   A right colon retroflexion was performed.  Retroflexed views revealed no abnormalities. The time to cecum=2 minutes 21 seconds. Withdrawal time=8 minutes 26 seconds.  The scope was withdrawn and the procedure completed. COMPLICATIONS: There were no complications.  ENDOSCOPIC IMPRESSION: 1.   Normal mucosa in the terminal ileum 2.   The colonic mucosa appeared normal throughout the entire examined colon; multiple random biopsies of the area were performed   RECOMMENDATIONS: Office will call with the results.   eSigned:  Iva Boop, MD, [facility] 05/07/2013 12:15 PM   cc: Crawford Givens, MD and The Patient

## 2013-05-10 ENCOUNTER — Telehealth: Payer: Self-pay | Admitting: *Deleted

## 2013-05-10 NOTE — Telephone Encounter (Signed)
Left message on number given in admitting on Friday to return call if questions, problems or concerns. ewm

## 2013-05-12 NOTE — Progress Notes (Signed)
Quick Note:  Office -   No colitis Her problem is IBS I think Lotronex is a good choice for her - would need to review that with her next week or perhaps this afternoon in office  [facility]  10 year recall 2024 - colonoscopy No letter ______

## 2013-05-17 ENCOUNTER — Other Ambulatory Visit: Payer: Self-pay | Admitting: *Deleted

## 2013-05-17 MED ORDER — VALACYCLOVIR HCL 500 MG PO TABS: 500.0000 mg | ORAL_TABLET | Freq: Every day | ORAL | Status: DC

## 2013-05-17 NOTE — Telephone Encounter (Signed)
Sent!

## 2013-05-17 NOTE — Telephone Encounter (Signed)
Faxed refill request. Please advise.  

## 2013-05-19 ENCOUNTER — Encounter: Payer: Self-pay | Admitting: *Deleted

## 2013-05-21 ENCOUNTER — Encounter: Payer: Self-pay | Admitting: Internal Medicine

## 2013-05-21 ENCOUNTER — Ambulatory Visit (INDEPENDENT_AMBULATORY_CARE_PROVIDER_SITE_OTHER): Payer: BC Managed Care – PPO | Admitting: Internal Medicine

## 2013-05-21 VITALS — BP 102/72 | HR 88 | Ht 67.5 in | Wt 222.0 lb

## 2013-05-21 DIAGNOSIS — K589 Irritable bowel syndrome without diarrhea: Secondary | ICD-10-CM

## 2013-05-21 MED ORDER — ALOSETRON HCL 0.5 MG PO TABS: 0.5000 mg | ORAL_TABLET | Freq: Two times a day (BID) | ORAL | Status: DC

## 2013-05-21 NOTE — Patient Instructions (Addendum)
We are giving you a written rx to take to the pharmacy for: Lotronex  Follow up with [place] in 6 weeks.  I appreciate the opportunity to care for you.

## 2013-05-21 NOTE — Assessment & Plan Note (Signed)
1. I think she is a good candidate for Lotronex. I've explained the risks benefits and indications. I specifically warned her that she became constipated or had significant abdominal cramping or any rectal bleeding she is to stop the medication and call me. 2. She knows not to use antidiarrheals with this. 3. She is red and signed the physician patient agreement regarding this. 4. She will start 0.5 mg twice a day and will see me in 6 weeks for reassessment and knows to call back sooner. I did explain that she might need a higher dose than a starting dose and we will sort that out over time. I have given her discount coupon it is not covered by her insurance.

## 2013-05-21 NOTE — Progress Notes (Signed)
  Subjective:    Patient ID: Tricia [name], female    DOB: [birth year], 46 y.o.   MRN: [id number]  HPI Patient is here. She had a colonoscopy to investigate chronic diarrhea problems and random biopsies were normal. I could find no evidence for colitis. I've explained to her that her diagnosis is irritable bowel syndrome, diarrhea predominant. She continues with diarrhea and crampy lower quadrant abdominal pain. She knows that alcohol might contribute to diarrhea but does not really drink that much but is a regular drinker of one to 2 a day, usually wine. She has failed dicyclomine in the past. Medications, allergies, past medical history, past surgical history, family history and social history are reviewed and updated in the EMR.  Review of Systems As above    Objective:   Physical Exam Pleasant, overweight to obese no acute distress       Assessment & Plan:   1. IBS (irritable bowel syndrome) - diarrhea predominant      I appreciate the opportunity to care for this patient.   CC: Crawford Givens, MD

## 2013-07-02 ENCOUNTER — Ambulatory Visit (INDEPENDENT_AMBULATORY_CARE_PROVIDER_SITE_OTHER): Payer: BC Managed Care – PPO | Admitting: Internal Medicine

## 2013-07-02 ENCOUNTER — Encounter: Payer: Self-pay | Admitting: Internal Medicine

## 2013-07-02 VITALS — BP 110/78 | HR 88 | Ht 67.5 in | Wt 222.5 lb

## 2013-07-02 DIAGNOSIS — K589 Irritable bowel syndrome without diarrhea: Secondary | ICD-10-CM

## 2013-07-02 MED ORDER — ALOSETRON HCL 1 MG PO TABS: 1.0000 mg | ORAL_TABLET | Freq: Two times a day (BID) | ORAL | Status: DC

## 2013-07-02 NOTE — Assessment & Plan Note (Signed)
Lotronex 0.5 mg bid helpful but she is having some diarrhea so will increase to 1 mg bid See me in a year, call back sooner prn

## 2013-07-02 NOTE — Progress Notes (Signed)
  Subjective:    Patient ID: Tricia [name], female    DOB: 1966/03/24, 47 y.o.   MRN: [id number]  HPI Lotronex 0.5 mg bid has been very helpful, especially at first but lately some more diarrhea. No bad abdominal cramps.  Medications, allergies, past medical history, past surgical history, family history and social history are reviewed and updated in the EMR.   Review of Systems As above    Objective:   Physical Exam NAD    Assessment & Plan:  IBS (irritable bowel syndrome) - diarrhea predominant

## 2013-07-02 NOTE — Patient Instructions (Addendum)
We are increasing your Lotronex to 1mg  twice a day.  Today we are giving you a printed rx for Lotronex.  Call us with any problems.   I appreciate the opportunity to care for you.

## 2013-07-21 ENCOUNTER — Other Ambulatory Visit: Payer: Self-pay | Admitting: Family Medicine

## 2013-07-21 NOTE — Telephone Encounter (Signed)
Received refill request electronically from pharmacy. Request does not match medication sheet. Please clarify which dose and directions. Last office visit 03/29/13.

## 2013-07-22 NOTE — Telephone Encounter (Signed)
Verify with patient.  25mg  BID should be similar to 50mg  QD.  We can sent it in the way she has been taking it.  Let me know.

## 2013-09-16 ENCOUNTER — Other Ambulatory Visit: Payer: Self-pay

## 2013-10-25 ENCOUNTER — Ambulatory Visit (INDEPENDENT_AMBULATORY_CARE_PROVIDER_SITE_OTHER): Payer: BC Managed Care – PPO | Admitting: Family Medicine

## 2013-10-25 ENCOUNTER — Encounter: Payer: Self-pay | Admitting: Family Medicine

## 2013-10-25 ENCOUNTER — Telehealth: Payer: Self-pay | Admitting: Family Medicine

## 2013-10-25 VITALS — BP 112/72 | HR 83 | Temp 98.0°F | Wt 227.5 lb

## 2013-10-25 DIAGNOSIS — G4700 Insomnia, unspecified: Secondary | ICD-10-CM

## 2013-10-25 DIAGNOSIS — F1010 Alcohol abuse, uncomplicated: Secondary | ICD-10-CM

## 2013-10-25 DIAGNOSIS — N898 Other specified noninflammatory disorders of vagina: Secondary | ICD-10-CM

## 2013-10-25 DIAGNOSIS — N760 Acute vaginitis: Secondary | ICD-10-CM

## 2013-10-25 DIAGNOSIS — J069 Acute upper respiratory infection, unspecified: Secondary | ICD-10-CM

## 2013-10-25 MED ORDER — METRONIDAZOLE 500 MG PO TABS: 500.0000 mg | ORAL_TABLET | Freq: Two times a day (BID) | ORAL | Status: DC

## 2013-10-25 NOTE — Patient Instructions (Signed)
Take metonidazole twice a day for 7 days.  Don't use any alcohol while taking that medicine.  This should get better.  Take care.   I'll be in touch after I talk to the psychiatry clinic.

## 2013-10-25 NOTE — Assessment & Plan Note (Signed)
Likely BV, start metronidazole with routine cautions.  She understood.

## 2013-10-25 NOTE — Assessment & Plan Note (Signed)
D/w pt about cutting back.  Would avoid with metronidazole currently.

## 2013-10-25 NOTE — Progress Notes (Signed)
Pre-visit discussion using our clinic review tool. No additional management support is needed unless otherwise documented below in the visit note.  Discharge (clear) and fishy odor for about 2 weeks.  No itching.  No unprotected sex.  On menses now.  No FCNAVD.  On baseline meds for IBS.    Insomnia. No SI/HI.  Still with 1-2 glasses of wine at night. Mood is good.  She had trouble getting through to psych clinic- [facility]- about a refill.  I tried mult times to call and couldn't get an answer.  I have asked staff to keep trying to call on the pt's behalf.    Recently with cold sx, URI, congestion.  Some ear pressure.  Voice is altered.   Meds, vitals, and allergies reviewed.   ROS: See HPI.  Otherwise, noncontributory.  GEN: nad, alert and oriented HEENT: mucous membranes moist, tm w/o erythema, nasal exam w/o erythema, clear discharge noted,  OP with cobblestoning NECK: supple w/o LA CV: rrr.   PULM: ctab, no inc wob  Self wet prep d/w pt.  Clue cells noted.

## 2013-10-25 NOTE — Assessment & Plan Note (Signed)
Likely viral, should resolve.  Nontoxic.  F/u prn.

## 2013-10-25 NOTE — Assessment & Plan Note (Signed)
I've call the psych clinic mult times today about trying coordinate her refill.  She reports being out of medicine and not sleeping well in spite of exhaustion.  Ambien CR worked better than other meds per patient report.  Mood stable per patient report.

## 2013-10-25 NOTE — Telephone Encounter (Signed)
Notify pt.  She already had ambien called in at [facility] (not the CR- she has sleep walking with that so they approved the short acting med).  Since it was already called in, I can't send other rx in the meantime.  If she has a question, she needs to talk to the psych clinc at [phone].

## 2013-10-26 NOTE — Telephone Encounter (Signed)
Patient notified as instructed by telephone. 

## 2013-12-02 ENCOUNTER — Other Ambulatory Visit: Payer: Self-pay | Admitting: Family Medicine

## 2013-12-17 ENCOUNTER — Ambulatory Visit (INDEPENDENT_AMBULATORY_CARE_PROVIDER_SITE_OTHER): Payer: BC Managed Care – PPO | Admitting: Family Medicine

## 2013-12-17 ENCOUNTER — Encounter: Payer: Self-pay | Admitting: Family Medicine

## 2013-12-17 VITALS — BP 132/92 | HR 72 | Temp 97.9°F | Ht 67.5 in | Wt 230.5 lb

## 2013-12-17 DIAGNOSIS — N3289 Other specified disorders of bladder: Secondary | ICD-10-CM

## 2013-12-17 DIAGNOSIS — B3731 Acute candidiasis of vulva and vagina: Secondary | ICD-10-CM

## 2013-12-17 DIAGNOSIS — N3010 Interstitial cystitis (chronic) without hematuria: Secondary | ICD-10-CM | POA: Insufficient documentation

## 2013-12-17 DIAGNOSIS — B373 Candidiasis of vulva and vagina: Secondary | ICD-10-CM

## 2013-12-17 DIAGNOSIS — R350 Frequency of micturition: Secondary | ICD-10-CM

## 2013-12-17 LAB — POCT WET PREP (WET MOUNT)
KOH Wet Prep POC: POSITIVE
Trichomonas Wet Prep HPF POC: NEGATIVE

## 2013-12-17 LAB — POCT URINALYSIS DIPSTICK
GLUCOSE UA: NEGATIVE
Ketones, UA: NEGATIVE
LEUKOCYTES UA: NEGATIVE
NITRITE UA: NEGATIVE
Protein, UA: NEGATIVE
Spec Grav, UA: 1.025
Urobilinogen, UA: 0.2
pH, UA: 6

## 2013-12-17 LAB — POCT UA - MICROSCOPIC ONLY

## 2013-12-17 MED ORDER — FLUCONAZOLE 150 MG PO TABS: 150.0000 mg | ORAL_TABLET | Freq: Once | ORAL | Status: DC

## 2013-12-17 MED ORDER — FLUCONAZOLE 150 MG PO TABS
150.0000 mg | ORAL_TABLET | Freq: Once | ORAL | Status: DC
Start: 2013-12-17 — End: 2013-12-17

## 2013-12-17 NOTE — Assessment & Plan Note (Signed)
No sign of UTI. Treat with fluids and irritant avoidance.

## 2013-12-17 NOTE — Addendum Note (Signed)
Addended by: Alvina ChouWALSH, TERRI J on: 12/17/2013 05:26 PM   Modules accepted: Orders

## 2013-12-17 NOTE — Progress Notes (Signed)
   Subjective:    Patient ID: Tricia [name], female    DOB: 01/12/1966, 48 y.o.   MRN: [id number]  Urinary Frequency  This is a new problem. The current episode started 1 to 4 weeks ago (2 weeks, treated on 12/15 with metronidazole x 7 days, improive vaginal discharge/odor but not  100%). The problem has been gradually worsening. Quality: none. The patient is experiencing no pain. There has been no fever. She is not sexually active. There is a history of pyelonephritis. Associated symptoms include a discharge, frequency and urgency. Pertinent negatives include no flank pain, nausea or vomiting. Associated symptoms comments: vaginal odor and discharge, itching. Mainly started after flagyl.  Fatigue Low abdominal pain. She has tried NSAIDs for the symptoms. The treatment provided no relief. Her past medical history is significant for recurrent UTIs. There is no history of catheterization, kidney stones, a single kidney, urinary stasis or a urological procedure.    She has history of IC.   Review of Systems  Gastrointestinal: Negative for nausea and vomiting.  Genitourinary: Positive for urgency and frequency. Negative for flank pain.       Objective:   Physical Exam  Constitutional: Vital signs are normal. She appears well-developed and well-nourished. She is cooperative.  Non-toxic appearance. She does not appear ill. No distress.  HENT:  Head: Normocephalic.  Right Ear: Hearing, tympanic membrane, external ear and ear canal normal. Tympanic membrane is not erythematous, not retracted and not bulging.  Left Ear: Hearing, tympanic membrane, external ear and ear canal normal. Tympanic membrane is not erythematous, not retracted and not bulging.  Nose: No mucosal edema or rhinorrhea. Right sinus exhibits no maxillary sinus tenderness and no frontal sinus tenderness. Left sinus exhibits no maxillary sinus tenderness and no frontal sinus tenderness.  Mouth/Throat: Uvula is midline, oropharynx is  clear and moist and mucous membranes are normal.  Eyes: Conjunctivae, EOM and lids are normal. Pupils are equal, round, and reactive to light. Lids are everted and swept, no foreign bodies found.  Neck: Trachea normal and normal range of motion. Neck supple. Carotid bruit is not present. No mass and no thyromegaly present.  Cardiovascular: Normal rate, regular rhythm, S1 normal, S2 normal, normal heart sounds, intact distal pulses and normal pulses.  Exam reveals no gallop and no friction rub.   No murmur heard. Pulmonary/Chest: Effort normal and breath sounds normal. Not tachypneic. No respiratory distress. She has no decreased breath sounds. She has no wheezes. She has no rhonchi. She has no rales.  Abdominal: Soft. Normal appearance and bowel sounds are normal. There is no hepatosplenomegaly. There is tenderness in the suprapubic area. There is no CVA tenderness.  Neurological: She is alert.  Skin: Skin is warm, dry and intact. No rash noted.  Psychiatric: Her speech is normal and behavior is normal. Judgment and thought content normal. Her mood appears not anxious. Cognition and memory are normal. She does not exhibit a depressed mood.          Assessment & Plan:

## 2013-12-17 NOTE — Assessment & Plan Note (Signed)
Diflucan

## 2013-12-17 NOTE — Addendum Note (Signed)
Addended by: Kerby NoraBEDSOLE, Burnell Hurta E on: 12/17/2013 05:04 PM   Modules accepted: Orders

## 2013-12-17 NOTE — Progress Notes (Signed)
Pre-visit discussion using our clinic review tool. No additional management support is needed unless otherwise documented below in the visit note.  

## 2013-12-17 NOTE — Patient Instructions (Addendum)
Bladder irritation: Push fluids, avoid caffeine, alcohol, tomato's, citris, spicy foods.  [facility] not improving [facility]. Treat vaginal symtpoms with diflucan x 1 , may repeat if not resolving in 2 days.

## 2013-12-20 ENCOUNTER — Telehealth: Payer: Self-pay

## 2013-12-20 NOTE — Telephone Encounter (Signed)
Pt wanted to ck on second diflucan; spoke with Homero FellersFrank at [facility] and did not see refill on diflucan that was sent 12/17/13; Homero FellersFrank added X1 tab for refill. Pt notified and she will contact pharmacy for refill.

## 2014-03-02 ENCOUNTER — Other Ambulatory Visit: Payer: Self-pay | Admitting: Family Medicine

## 2014-03-02 NOTE — Telephone Encounter (Signed)
Received refill request electronically. Last refill 12/02/13 #60, last office visit 12/17/13 acute visit. Is it okay to refill medication?

## 2014-03-03 NOTE — Telephone Encounter (Signed)
Sent!

## 2014-04-19 ENCOUNTER — Other Ambulatory Visit: Payer: Self-pay | Admitting: Family Medicine

## 2014-05-09 ENCOUNTER — Encounter: Payer: Self-pay | Admitting: Family Medicine

## 2014-05-09 ENCOUNTER — Ambulatory Visit (INDEPENDENT_AMBULATORY_CARE_PROVIDER_SITE_OTHER)
Admission: RE | Admit: 2014-05-09 | Discharge: 2014-05-09 | Disposition: A | Discharge status: Home / Self Care | Payer: BC Managed Care – PPO | Source: Ambulatory Visit | Attending: Family Medicine | Admitting: Family Medicine

## 2014-05-09 ENCOUNTER — Ambulatory Visit (INDEPENDENT_AMBULATORY_CARE_PROVIDER_SITE_OTHER): Payer: BC Managed Care – PPO | Admitting: Family Medicine

## 2014-05-09 VITALS — BP 134/84 | HR 84 | Temp 97.6°F | Wt 220.2 lb

## 2014-05-09 DIAGNOSIS — R071 Chest pain on breathing: Secondary | ICD-10-CM

## 2014-05-09 DIAGNOSIS — R0789 Other chest pain: Secondary | ICD-10-CM

## 2014-05-09 MED ORDER — HYDROCODONE-ACETAMINOPHEN 5-325 MG PO TABS: 1.0000 | ORAL_TABLET | Freq: Four times a day (QID) | ORAL | Status: DC | PRN

## 2014-05-09 NOTE — Progress Notes (Signed)
Pre visit review using our clinic review tool, if applicable. No additional management support is needed unless otherwise documented below in the visit note.  Got out of bed, stumbled and hit her R lower lateral chest Sou.  Pain with taking a deep breath, bending over, coughing, etc.  Happened on 04/17/14.  No LOC.  No FCNAVD.  No L sided pain.  Bruising noted on R lower abd Barcellos. She is point tender on the lower ribs.   Much less etoh now, rare glass of wine.   Meds, vitals, and allergies reviewed.   ROS: See HPI.  Otherwise, noncontributory.  nad ncat Mmm rrr ctab Pain with a deep breath (R chest Zaucha), better with external compression R chest Rosenboom bruised No midline back pain

## 2014-05-09 NOTE — Patient Instructions (Addendum)
Give yourself a hug several times a day and practice taking deeper breaths.  Use the pain medicine in the meantime.  Take care.

## 2014-05-10 DIAGNOSIS — R0789 Other chest pain: Secondary | ICD-10-CM | POA: Insufficient documentation

## 2014-05-10 NOTE — Assessment & Plan Note (Signed)
Mood improved, less etoh use.  Okay for opiate use given the pain, with sedation caution.  She agrees.  No fx seen, could have occult fx.   xrays reviewed.

## 2014-05-11 ENCOUNTER — Telehealth: Payer: Self-pay

## 2014-05-11 NOTE — Telephone Encounter (Signed)
pt was seen 05/09/14 after fall;? fx rib. Hydrocodone apap not helping pain. Pain level now is 10. Pt having SOB and difficulty getting breath due to pain in rt side of chest under breast. Pt said breathing and pain level has worsened since seen on 05/09/14. No "heart pain" or H/a or dizziness. Dr Para Marchuncan advised [facility]. Pt will go to ED now.

## 2014-06-14 ENCOUNTER — Encounter: Payer: Self-pay | Admitting: Family Medicine

## 2014-06-14 ENCOUNTER — Ambulatory Visit (INDEPENDENT_AMBULATORY_CARE_PROVIDER_SITE_OTHER): Payer: BC Managed Care – PPO | Admitting: Family Medicine

## 2014-06-14 VITALS — BP 146/96 | HR 90 | Temp 97.7°F | Wt 220.0 lb

## 2014-06-14 DIAGNOSIS — R071 Chest pain on breathing: Secondary | ICD-10-CM

## 2014-06-14 DIAGNOSIS — R221 Localized swelling, mass and lump, neck: Secondary | ICD-10-CM

## 2014-06-14 DIAGNOSIS — R220 Localized swelling, mass and lump, head: Secondary | ICD-10-CM

## 2014-06-14 DIAGNOSIS — R0789 Other chest pain: Secondary | ICD-10-CM

## 2014-06-14 MED ORDER — HYDROCODONE-ACETAMINOPHEN 5-325 MG PO TABS: 1.0000 | ORAL_TABLET | Freq: Four times a day (QID) | ORAL | Status: DC | PRN

## 2014-06-14 NOTE — Patient Instructions (Signed)
This looks like a lipoma, a benign (noncancerous) fatty tumor.  Let me check on options and we'll be in touch.   Take care.

## 2014-06-14 NOTE — Progress Notes (Signed)
Pre visit review using our clinic review tool, if applicable. No additional management support is needed unless otherwise documented below in the visit note.  Chest Eisenhower is still sore, some better, but not resolved.  Still with positional pain, still with leaning over, bending, coughing, etc.    R side of neck with swelling, present for about 6 months.  Bigger recently, slowly enlarging.  Not tender.  No trauma.  Not bruised.    H/o C fusion noted  Meds, vitals, and allergies reviewed.   ROS: See HPI.  Otherwise, noncontributory.  nad ncat Neck supple, no LA ~3cm puffy but not ttp mass noted on the R lower neck, just superior to the medial portion of the R clavicle.  Visible, nonpulsating, no overlying skin erythema.  rrr ctab R chest Hermida still ttp as expected.

## 2014-06-15 ENCOUNTER — Telehealth: Payer: Self-pay | Admitting: Family Medicine

## 2014-06-15 DIAGNOSIS — R221 Localized swelling, mass and lump, neck: Secondary | ICD-10-CM | POA: Insufficient documentation

## 2014-06-15 MED ORDER — DIAZEPAM 5 MG PO TABS
2.5000 mg | ORAL_TABLET | Freq: Once | ORAL | Status: DC
Start: 2014-06-15 — End: 2014-08-02

## 2014-06-15 NOTE — Assessment & Plan Note (Signed)
Continue prn hydrocodone. Should slowly resolve.  She agrees.

## 2014-06-15 NOTE — Assessment & Plan Note (Signed)
I called rady for advice, would get MRI done.  That would be imaging of choice.  Likely a lipoma, but I would like to assess further.  D/w pt.  Will notify pt about rady advice.

## 2014-06-15 NOTE — Telephone Encounter (Signed)
Call pt. I checked with rady.  She would need MRI.  I put the order in.  That is the best option for the imaging. Thanks.

## 2014-06-15 NOTE — Telephone Encounter (Signed)
Patient is extremely claustrophobic and is requesting  Valium be called in for her MRI . [facility]. Pls call her at [phone] if there is a problem.

## 2014-06-15 NOTE — Telephone Encounter (Signed)
Please call in.  Thanks.   

## 2014-06-15 NOTE — Telephone Encounter (Signed)
rx called into pharmacy

## 2014-06-15 NOTE — Telephone Encounter (Signed)
[name] will call patient with information and order

## 2014-06-24 ENCOUNTER — Ambulatory Visit
Admission: RE | Admit: 2014-06-24 | Discharge: 2014-06-24 | Disposition: A | Discharge status: Home / Self Care | Payer: BC Managed Care – PPO | Source: Ambulatory Visit | Attending: Family Medicine | Admitting: Family Medicine

## 2014-06-24 DIAGNOSIS — R221 Localized swelling, mass and lump, neck: Secondary | ICD-10-CM

## 2014-06-24 MED ORDER — GADOBENATE DIMEGLUMINE 529 MG/ML IV SOLN
20.0000 mL | Freq: Once | INTRAVENOUS | Status: AC | PRN
  Administered 2014-06-24: 20 mL via INTRAVENOUS

## 2014-07-19 ENCOUNTER — Telehealth: Payer: Self-pay

## 2014-07-19 MED ORDER — ALOSETRON HCL 1 MG PO TABS: 1.0000 mg | ORAL_TABLET | Freq: Two times a day (BID) | ORAL | Status: DC

## 2014-07-19 NOTE — Telephone Encounter (Signed)
Verbally refilled her Lotronex with the [facility] and left message for patient to see [place] in 6 months unless needs [place] sooner.

## 2014-07-19 NOTE — Telephone Encounter (Signed)
Message copied by [place], Steadman Prosperi E on Tue Jul 19, 2014  4:49 PM ------      Message from: Iva Boop      Created: Tue Jul 19, 2014  2:07 PM       OK to refill and she can see me in 6 months unless having problems            Refill x 6      ----- Message -----         From: Maela Takeda E [place], CMA         Sent: 07/19/2014   1:48 PM           To: Iva Boop, MD            Got fax requesting refill on her Lotronex.  She is due for appointment I know.  May I refill Sir and try and make her appointment.  Thank you for your time.       ------

## 2014-08-02 ENCOUNTER — Encounter: Payer: Self-pay | Admitting: Internal Medicine

## 2014-08-02 ENCOUNTER — Ambulatory Visit (INDEPENDENT_AMBULATORY_CARE_PROVIDER_SITE_OTHER): Payer: BC Managed Care – PPO | Admitting: Internal Medicine

## 2014-08-02 VITALS — BP 130/88 | HR 73 | Temp 97.8°F | Wt 219.0 lb

## 2014-08-02 DIAGNOSIS — N3010 Interstitial cystitis (chronic) without hematuria: Secondary | ICD-10-CM

## 2014-08-02 DIAGNOSIS — R350 Frequency of micturition: Secondary | ICD-10-CM

## 2014-08-02 LAB — POCT URINALYSIS DIPSTICK
Bilirubin, UA: NEGATIVE
Glucose, UA: NEGATIVE
KETONES UA: NEGATIVE
Leukocytes, UA: NEGATIVE
Nitrite, UA: NEGATIVE
SPEC GRAV UA: 1.02
Urobilinogen, UA: NEGATIVE
pH, UA: 6

## 2014-08-02 MED ORDER — OXYBUTYNIN CHLORIDE 5 MG PO TABS: 5.0000 mg | ORAL_TABLET | Freq: Three times a day (TID) | ORAL | Status: DC

## 2014-08-02 NOTE — Progress Notes (Signed)
Subjective:    Patient ID: Tricia [name], female    DOB: 10-09-1966, 48 y.o.   MRN: [id number]  HPI    Pt presents to the clinic today with c/o urinary symptoms. She denies dysuria, but reports nocturia - 6 times last night.  She has history of interstitial cystitis and believes that this is what is going on.  She has not taken anything for relief.   Review of Systems  Past Medical History  Diagnosis Date  . Anxiety     Panic  . Interstitial cystitis     per [facility]  . Cardiomyopathy     EF 45% by myoview, 45-50% by echo, 50% by MRI. ? PVC-associated cardiomyopathy  . Chest pain     ETT myoview 4/11) 6'15", frequent PVCs and run NSVT, mild global HK with EF 45%, fixed anterior defect was likely soft tissue attenuation so no evidence for ischemia or infarction.  . Abnormal MRI, cervical spine 08/2010    with uncovertebral disease on the left at C6-7 encroaching on the left C7 root.  . Toe fracture, right 11/04/11    2,3rd toes  . Insomnia     uses ambien  . Premature ventricular contractions     resolved with metoprolol  . Depression     suicidal thoughts 2013, with BH eval  . Angioedema 12/2012    ? food allergy - ED eval  . Irritable bowel syndrome with diarrhea     Family History  Problem Relation Age of Onset  . Heart disease Maternal Grandfather 51    CABG  . Heart disease Cousin     Aortic dissection    History   Social History  . Marital Status: Legally Separated    Spouse Name: N/A    Number of Children: 1  . Years of Education: N/A   Occupational History  . Registrar at [employer]    Social History Main Topics  . Smoking status: Never Smoker   . Smokeless tobacco: Never Used  . Alcohol Use: Yes     Comment: daily  . Drug Use: No  . Sexual Activity: Yes    Birth Control/ Protection: Condom   Other Topics Concern  . Not on file   Social History Narrative   Divorced, one daughter    Lives in [place]   She is a Engineer, materials at [employer]   She has been a regular user of alcohol    Allergies  Allergen Reactions  . Bentyl [Dicyclomine Hcl]     tingling  . Clarithromycin     REACTION: nausea and vomiting    Constitutional: Denies fever, malaise, fatigue, headache or abrupt weight changes.   GU: Pt reports urgency, frequency. Denies burning sensation, blood in urine,  or discharge. Skin: Denies redness, rashes, lesions or ulcercations.   No other specific complaints in a complete review of systems (except as listed in HPI above).    Objective:   Physical Exam . BP 130/88  Pulse 73  Temp(Src) 97.8 F (36.6 C) (Oral)  Wt 219 lb (99.338 kg)  SpO2 98% Wt Readings from Last 3 Encounters:  08/02/14 219 lb (99.338 kg)  06/14/14 220 lb (99.791 kg)  05/09/14 220 lb 4 oz (99.905 kg)    General: Appears her stated age, well developed, well nourished in NAD. Cardiovascular: Normal rate and rhythm. S1,S2 noted.  No murmur, rubs or gallops noted. Pulmonary/Chest: Normal effort and positive vesicular breath sounds. No respiratory distress. No  wheezes, rales or ronchi noted.  Abdomen: Soft and nontender. Normal bowel sounds, no bruits noted. No distention or masses noted. Liver, spleen and kidneys non palpable. Tender to palpation over the bladder area. No CVA tenderness.      Assessment & Plan:   Urgency, Frequency, secondary to Interstitial Cystitis  1. Urine frequency  - POCT urinalysis dipstick  2. IC (interstitial cystitis)  - oxybutynin (DITROPAN) 5 MG tablet; Take 1 tablet (5 mg total) by mouth 3 (three) times daily.  Dispense: 30 tablet; Refill: 0   Urinalysis: positive for trace blood and protein Drink plenty of fluids  RTC as needed or if symptoms persist.   Review of Systems     Objective:   Physical Exam        Assessment & Plan:

## 2014-08-02 NOTE — Progress Notes (Signed)
Pre visit review using our clinic review tool, if applicable. No additional management support is needed unless otherwise documented below in the visit note. 

## 2014-08-02 NOTE — Progress Notes (Signed)
HPI  Pt presents to the clinic today with c/o frequency, urine odor and lower abdominal pain. She reports this started 4-6 weeks ago. She has not tried anything OTC. She does have a history of interstitial cystitis.   Review of Systems  Past Medical History  Diagnosis Date  . Anxiety     Panic  . Interstitial cystitis     per [facility]  . Cardiomyopathy     EF 45% by myoview, 45-50% by echo, 50% by MRI. ? PVC-associated cardiomyopathy  . Chest pain     ETT myoview 4/11) 6'15", frequent PVCs and run NSVT, mild global HK with EF 45%, fixed anterior defect was likely soft tissue attenuation so no evidence for ischemia or infarction.  . Abnormal MRI, cervical spine 08/2010    with uncovertebral disease on the left at C6-7 encroaching on the left C7 root.  . Toe fracture, right 11/04/11    2,3rd toes  . Insomnia     uses ambien  . Premature ventricular contractions     resolved with metoprolol  . Depression     suicidal thoughts 2013, with BH eval  . Angioedema 12/2012    ? food allergy - ED eval  . Irritable bowel syndrome with diarrhea     Family History  Problem Relation Age of Onset  . Heart disease Maternal Grandfather 90    CABG  . Heart disease Cousin     Aortic dissection    History   Social History  . Marital Status: Legally Separated    Spouse Name: N/A    Number of Children: 1  . Years of Education: N/A   Occupational History  . Registrar at [employer]    Social History Main Topics  . Smoking status: Never Smoker   . Smokeless tobacco: Never Used  . Alcohol Use: Yes     Comment: daily  . Drug Use: No  . Sexual Activity: Yes    Birth Control/ Protection: Condom   Other Topics Concern  . Not on file   Social History Narrative   Divorced, one daughter    Lives in [place]   She is a Sales promotion account executive at [employer]   She has been a regular user of alcohol    Allergies  Allergen Reactions  . Bentyl [Dicyclomine Hcl]     tingling   . Clarithromycin     REACTION: nausea and vomiting    Constitutional: Denies fever, malaise, fatigue, headache or abrupt weight changes.   GU: Pt reports frequency and urine odor. Denies burning sensation, blood in urine, dysuria or discharge. Skin: Denies redness, rashes, lesions or ulcercations.   No other specific complaints in a complete review of systems (except as listed in HPI above).    Objective:   Physical Exam  BP 130/88  Pulse 73  Temp(Src) 97.8 F (36.6 C) (Oral)  Wt 219 lb (99.338 kg)  SpO2 98%  Wt Readings from Last 3 Encounters:  08/02/14 219 lb (99.338 kg)  06/14/14 220 lb (99.791 kg)  05/09/14 220 lb 4 oz (99.905 kg)    General: Appears her stated age, well developed, well nourished in NAD. Cardiovascular: Normal rate and rhythm. S1,S2 noted.  No murmur, rubs or gallops noted. No JVD or BLE edema. No carotid bruits noted. Pulmonary/Chest: Normal effort and positive vesicular breath sounds. No respiratory distress. No wheezes, rales or ronchi noted.  Abdomen: Soft. Normal bowel sounds, no bruits noted. No distention or masses noted. Liver, spleen and  kidneys non palpable. Tender to palpation over the bladder area. No CVA tenderness.      Assessment & Plan:   Frequency secondary to Interstitial Cystitis:  Urinalysis: trace blood and protein Drink plenty of fluids eRx for Ditropan TID prn If no improvement, follow up with urology Work note provided RTC as needed or if symptoms persist.

## 2014-08-02 NOTE — Patient Instructions (Signed)

## 2014-08-26 ENCOUNTER — Other Ambulatory Visit: Payer: Self-pay

## 2014-09-16 ENCOUNTER — Other Ambulatory Visit: Payer: Self-pay | Admitting: Family Medicine

## 2014-12-06 ENCOUNTER — Other Ambulatory Visit: Payer: Self-pay | Admitting: *Deleted

## 2014-12-06 NOTE — Telephone Encounter (Signed)
Faxed refill request.  Last Filled:   ? 12/17/13. Please advise.

## 2014-12-07 NOTE — Telephone Encounter (Signed)
Please check with pharmacy to see if this had been coming through other clinics.  Thanks.

## 2014-12-07 NOTE — Telephone Encounter (Signed)
Spoke to Rob at [facility] and was advised that patient has been getting this thru Dr. Lance CoonPoulos (psych) and last refill was 10/20/14 #30.

## 2014-12-07 NOTE — Telephone Encounter (Signed)
Pt left v/m requesting cb about status of ambien refill. Pt request cb. Pt does not have ins at this time and pt request refill.

## 2014-12-07 NOTE — Telephone Encounter (Signed)
She needs to make arrangements with rx'ing clinic.  The last time I did this rx it was as a temporary measure and that was over a year ago.  I didn't fill the rx.

## 2014-12-08 NOTE — Telephone Encounter (Signed)
Patient contacted and says she has been getting the Rx from Dr. Lance CoonPoulos but she is no longer able to see her because she doesn't have insurance and she can't afford her office visits.  Therefore, she was hoping to get the medication from Dr. Para Marchuncan.

## 2014-12-09 MED ORDER — ZOLPIDEM TARTRATE 10 MG PO TABS: 10.0000 mg | ORAL_TABLET | Freq: Every evening | ORAL | Status: DC | PRN

## 2014-12-09 NOTE — Telephone Encounter (Signed)
Medication phoned to pharmacy. Left detailed message on voicemail.  

## 2014-12-09 NOTE — Telephone Encounter (Signed)
I'll fill it once to allow her time to make arrangements with her psych clinic.  This is a one time fill.  Make sure patient is aware of that.  Please call in.  Thanks.

## 2014-12-26 ENCOUNTER — Telehealth: Payer: Self-pay

## 2014-12-26 NOTE — Telephone Encounter (Signed)
Pt [facility] to know if [facility]; could not find record of tb skin test; pt is applying for a job and has no ins so she will ck with health dept about getting tb skin test.

## 2014-12-29 ENCOUNTER — Telehealth: Payer: Self-pay | Admitting: Cardiology

## 2014-12-29 NOTE — Telephone Encounter (Signed)
She can use bisoprolol 5 mg daily instead of Toprol XL.  If that is too expensive, can use metoprolol 25 mg bid.  Needs office followup and echo at some point.

## 2014-12-29 NOTE — Telephone Encounter (Signed)
LMTCB

## 2014-12-29 NOTE — Telephone Encounter (Signed)
Pt states she does not have insurance now and is having a hard time affording Toprol XL.  Pt states her usual dose of Toprol XL is 25mg  two times a day.   Since she has not had insurance she has been taking Toprol XL 25mg  daily. She  is experiencing chest pain taking it only daily, on bid dose she was not having any chest pain  Pt asking for less expensive alternative to Toprol XL.   Pt states last office with Dr Shirlee LatchMcLean has been several years ago.   Pt advised I will forward to Dr Shirlee LatchMcLean for review.

## 2014-12-29 NOTE — Telephone Encounter (Signed)
Pt's mother Marlana LatusJudy Twine was in today and talked to you about pt, said she was to call you back, pls call [phone]

## 2014-12-30 ENCOUNTER — Telehealth: Payer: Self-pay | Admitting: Family Medicine

## 2014-12-30 MED ORDER — METOPROLOL TARTRATE 25 MG PO TABS: 25.0000 mg | ORAL_TABLET | Freq: Two times a day (BID) | ORAL | Status: DC

## 2014-12-30 NOTE — Telephone Encounter (Signed)
Pt called stating she needs tb test done for work Is it ok to schedule ?

## 2014-12-30 NOTE — Telephone Encounter (Signed)
Pt requests a prescription for metoprolol tartrate 25mg  bid (will cost $16) to [facility].

## 2014-12-30 NOTE — Telephone Encounter (Signed)
LMTCB

## 2014-12-30 NOTE — Telephone Encounter (Signed)
Discussed medication options with patient.   Pt is going to check prices of bisoprolol and metoprolol. She will let me know when she decides on changing medications.

## 2014-12-30 NOTE — Telephone Encounter (Signed)
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 Pt states she will call back to schedule echo and office visit with Dr Shirlee LatchMcLean once she has insurance.

## 2014-12-30 NOTE — Telephone Encounter (Signed)
Left message asking pt to call office  °

## 2014-12-30 NOTE — Telephone Encounter (Signed)
Yes please but remember, not on Thursdays.  Nurse visit.

## 2014-12-30 NOTE — Telephone Encounter (Signed)
Follow Up ° °Pt returned call//  °

## 2014-12-30 NOTE — Telephone Encounter (Signed)
Appointment 2/23  Pt aware

## 2015-01-03 ENCOUNTER — Telehealth: Payer: Self-pay | Admitting: *Deleted

## 2015-01-03 ENCOUNTER — Ambulatory Visit (INDEPENDENT_AMBULATORY_CARE_PROVIDER_SITE_OTHER): Payer: Self-pay | Admitting: *Deleted

## 2015-01-03 DIAGNOSIS — Z111 Encounter for screening for respiratory tuberculosis: Secondary | ICD-10-CM

## 2015-01-03 NOTE — Telephone Encounter (Signed)
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 She can't afford an ov right now, no insurance, but wanted to know if you could complete the form. She will pick up form when she returns on Thurs for tb read. I'll put both forms in your inbox.

## 2015-01-04 NOTE — Telephone Encounter (Signed)
Let me look at the form first.  Thanks.

## 2015-01-05 LAB — TB SKIN TEST: TB Skin Test: NEGATIVE

## 2015-01-05 NOTE — Telephone Encounter (Signed)
We can sign off on the TB testing.  She is going to need to get the other form approved/done by the psych clinic that she had seen prev.

## 2015-01-05 NOTE — Telephone Encounter (Signed)
Patient advised.   Copy made of TB testing for scanning.

## 2015-01-05 NOTE — Addendum Note (Signed)
Addended by: Baldomero LamyHAVERS, NATASHA C on: 01/05/2015 04:28 PM   Modules accepted: Orders

## 2015-01-06 NOTE — Telephone Encounter (Signed)
Form placed in Carrie's folder to scan.

## 2015-01-12 ENCOUNTER — Encounter: Payer: Self-pay | Admitting: Family Medicine

## 2015-01-12 ENCOUNTER — Ambulatory Visit (INDEPENDENT_AMBULATORY_CARE_PROVIDER_SITE_OTHER): Payer: No Typology Code available for payment source | Admitting: Family Medicine

## 2015-01-12 VITALS — BP 112/86 | HR 74 | Temp 97.8°F | Wt 207.8 lb

## 2015-01-12 DIAGNOSIS — N3010 Interstitial cystitis (chronic) without hematuria: Secondary | ICD-10-CM

## 2015-01-12 DIAGNOSIS — F3342 Major depressive disorder, recurrent, in full remission: Secondary | ICD-10-CM

## 2015-01-12 DIAGNOSIS — R300 Dysuria: Secondary | ICD-10-CM

## 2015-01-12 DIAGNOSIS — N3289 Other specified disorders of bladder: Secondary | ICD-10-CM

## 2015-01-12 DIAGNOSIS — I472 Ventricular tachycardia: Secondary | ICD-10-CM

## 2015-01-12 DIAGNOSIS — I4729 Other ventricular tachycardia: Secondary | ICD-10-CM

## 2015-01-12 DIAGNOSIS — Z3009 Encounter for other general counseling and advice on contraception: Secondary | ICD-10-CM

## 2015-01-12 LAB — POCT URINALYSIS DIPSTICK
Bilirubin, UA: NEGATIVE
Blood, UA: NEGATIVE
Glucose, UA: NEGATIVE
Ketones, UA: NEGATIVE
LEUKOCYTES UA: NEGATIVE
NITRITE UA: NEGATIVE
PH UA: 6
Protein, UA: 0.15
UROBILINOGEN UA: 4

## 2015-01-12 MED ORDER — VALACYCLOVIR HCL 500 MG PO TABS: ORAL_TABLET | ORAL | Status: DC

## 2015-01-12 MED ORDER — METOPROLOL TARTRATE 25 MG PO TABS: 25.0000 mg | ORAL_TABLET | Freq: Two times a day (BID) | ORAL | Status: DC

## 2015-01-12 MED ORDER — OXYBUTYNIN CHLORIDE 5 MG PO TABS: 5.0000 mg | ORAL_TABLET | Freq: Three times a day (TID) | ORAL | Status: DC

## 2015-01-12 MED ORDER — DROSPIRENONE-ETHINYL ESTRADIOL 3-0.03 MG PO TABS: 1.0000 | ORAL_TABLET | Freq: Every day | ORAL | Status: DC

## 2015-01-12 MED ORDER — ZOLPIDEM TARTRATE 10 MG PO TABS: 10.0000 mg | ORAL_TABLET | Freq: Every evening | ORAL | Status: DC | PRN

## 2015-01-12 MED ORDER — FLUOXETINE HCL 20 MG PO TABS: 20.0000 mg | ORAL_TABLET | Freq: Every day | ORAL | Status: DC

## 2015-01-12 NOTE — Progress Notes (Signed)
Pre visit review using our clinic review tool, if applicable. No additional management support is needed unless otherwise documented below in the visit note.  Possible UTI.  H/o IC.  No burning but frequency of urination.  No fevers.  Off oxybutynin.    H/o IBS, still controlled off meds.  No sig diarrhea or abd pain.  Off lotronex currently.    MDD.  Had seen psych prev but couldn't get back in due to finances.  Doing well on med currently.  No SI/HI.  Mood is good.  occ wine, not to excess.  No illicits, not smoking.  Had a job form that I filled out today, she has no contraindication from working since she is compliant with meds and has no sx currently.  She is doing well.    Needs refill on OCPs.  Hasn't been able to get back to gyn clinic due to finances, she is going to try to work on this.  No ADE on meds prev.  Not smoking.    H/o tachycardia, controlled on BB and doing well w/o sx.    PMH and SH reviewed  ROS: See HPI, otherwise noncontributory.  Meds, vitals, and allergies reviewed.   GEN: nad, alert and oriented HEENT: mucous membranes moist NECK: supple w/o LA CV: rrr.  PULM: ctab, no inc wob ABD: soft, +bs, no cva pain EXT: no edema SKIN: no acute rash Speech affect and mood wnl.

## 2015-01-12 NOTE — Patient Instructions (Signed)
Drink plenty of water and we'll contact you with your lab report.   Start back on oxybutinin in the meantime and see if that helps.  Take care.  Glad to see you.

## 2015-01-13 DIAGNOSIS — Z3009 Encounter for other general counseling and advice on contraception: Secondary | ICD-10-CM | POA: Insufficient documentation

## 2015-01-13 LAB — URINE CULTURE: Colony Count: 40000

## 2015-01-13 NOTE — Assessment & Plan Note (Signed)
Had seen psych prev but couldn't get back in due to finances. Doing well on med currently. No SI/HI. Mood is good. occ wine, not to excess. No illicits, not smoking. Had a job form that I filled out today, she has no contraindication from working since she is compliant with meds and has no sx currently. She is doing well.

## 2015-01-13 NOTE — Assessment & Plan Note (Signed)
Could be contributing, h/o IC noted, likely not infective cystitis, check ucx, restart oxybutynin.  D/w pt.  She agrees.

## 2015-01-13 NOTE — Assessment & Plan Note (Signed)
Okay to continue OCPs.  rx sent.  Not smoking, no ADE prev.

## 2015-01-13 NOTE — Assessment & Plan Note (Signed)
Continue BB, doing well.  She agrees.

## 2015-02-06 ENCOUNTER — Ambulatory Visit (INDEPENDENT_AMBULATORY_CARE_PROVIDER_SITE_OTHER): Payer: No Typology Code available for payment source | Admitting: Family Medicine

## 2015-02-06 ENCOUNTER — Encounter: Payer: Self-pay | Admitting: Family Medicine

## 2015-02-06 VITALS — BP 132/90 | HR 90 | Temp 98.0°F | Wt 203.1 lb

## 2015-02-06 DIAGNOSIS — J209 Acute bronchitis, unspecified: Secondary | ICD-10-CM

## 2015-02-06 MED ORDER — AZITHROMYCIN 250 MG PO TABS: ORAL_TABLET | ORAL | Status: DC

## 2015-02-06 MED ORDER — GUAIFENESIN-CODEINE 100-10 MG/5ML PO SYRP: 5.0000 mL | ORAL_SOLUTION | Freq: Every evening | ORAL | Status: DC | PRN

## 2015-02-06 NOTE — Progress Notes (Signed)
Pre visit review using our clinic review tool, if applicable. No additional management support is needed unless otherwise documented below in the visit note. 

## 2015-02-06 NOTE — Assessment & Plan Note (Addendum)
Given duration of sxs, will treat with zpack antibiotic course to cover atypical infection.  Push fluids, rest, cheratussin for cough. Update if not improving with treatment. Pt agrees with plan.

## 2015-02-06 NOTE — Patient Instructions (Addendum)
I do think you have bronchitis - treat with zpack. Push fluids and rest Ok to continue advil with plenty of fluid and take with meals. cheratussin for cough.

## 2015-02-06 NOTE — Progress Notes (Signed)
BP 132/90 mmHg  Pulse 90  Temp(Src) 98 F (36.7 C) (Oral)  Wt 203 lb 1.9 oz (92.135 kg)  SpO2 99%  LMP 10/25/2014   CC: cough  Subjective:    Patient ID: Tricia [name], female    DOB: 09-08-1966, 49 y.o.   MRN: [id number]  HPI: Tricia [name] is a 49 y.o. female presenting on 02/06/2015 for Cough   Sick for last 3 months. Started as cough, progressively worsening. + head congestion, dry cough, chest tightness, coughing that leads to gagging. Some tooth pain. Head and chest congestion. Mild ST and PNdrainage. +dyspnea with coughing fits.  No fevers/chills, ear pain.   Never smoker. No smokers at home. Taking advil for sxs. Taking mucinex and several OTC remedies to no avail.  No h/o asthma, COPD. No h/o allergic rhinitis.  Sleeping well with ambien.  Relevant past medical, surgical, family and social history reviewed and updated as indicated. Interim medical history since our last visit reviewed. Allergies and medications reviewed and updated. Current Outpatient Prescriptions on File Prior to Visit  Medication Sig  . drospirenone-ethinyl estradiol (OCELLA) 3-0.03 MG tablet Take 1 tablet by mouth daily.  Marland Kitchen EPINEPHrine (EPIPEN) 0.3 mg/0.3 mL DEVI Inject 0.3 mLs (0.3 mg total) into the muscle as needed.  Marland Kitchen FLUoxetine (PROZAC) 20 MG tablet Take 1 tablet (20 mg total) by mouth daily.  . metoprolol tartrate (LOPRESSOR) 25 MG tablet Take 1 tablet (25 mg total) by mouth 2 (two) times daily.  Marland Kitchen oxybutynin (DITROPAN) 5 MG tablet Take 1 tablet (5 mg total) by mouth 3 (three) times daily.  . valACYclovir (VALTREX) 500 MG tablet TAKE 1 TABLET BY MOUTH DAILY FOR FEVER BLISTERS  . zolpidem (AMBIEN) 10 MG tablet Take 1 tablet (10 mg total) by mouth at bedtime as needed for sleep.   No current facility-administered medications on file prior to visit.    Review of Systems Per HPI unless specifically indicated above     Objective:    BP 132/90 mmHg  Pulse 90  Temp(Src) 98 F (36.7 C)  (Oral)  Wt 203 lb 1.9 oz (92.135 kg)  SpO2 99%  LMP 10/25/2014  Wt Readings from Last 3 Encounters:  02/06/15 203 lb 1.9 oz (92.135 kg)  01/12/15 207 lb 12 oz (94.235 kg)  08/02/14 219 lb (99.338 kg)    Physical Exam  Constitutional: She appears well-developed and well-nourished. No distress.  HENT:  Head: Normocephalic and atraumatic.  Right Ear: Hearing, tympanic membrane, external ear and ear canal normal.  Left Ear: Hearing, tympanic membrane, external ear and ear canal normal.  Nose: Mucosal edema (R>L) present. No rhinorrhea. Right sinus exhibits maxillary sinus tenderness. Right sinus exhibits no frontal sinus tenderness. Left sinus exhibits maxillary sinus tenderness. Left sinus exhibits no frontal sinus tenderness.  Mouth/Throat: Uvula is midline and mucous membranes are normal. Posterior oropharyngeal edema and posterior oropharyngeal erythema present. No oropharyngeal exudate or tonsillar abscesses.  Eyes: Conjunctivae and EOM are normal. Pupils are equal, round, and reactive to light. No scleral icterus.  Neck: Normal range of motion. Neck supple.  Cardiovascular: Normal rate, regular rhythm, normal heart sounds and intact distal pulses.   No murmur heard. Pulmonary/Chest: Effort normal and breath sounds normal. No respiratory distress. She has no wheezes. She has no rales.  Lymphadenopathy:    She has no cervical adenopathy.  Skin: Skin is warm and dry. No rash noted.  Nursing note and vitals reviewed.     Assessment & Plan:  Problem List Items Addressed This Visit    Acute bronchitis - Primary    Given duration of sxs, will treat with zpack antibiotic course to cover atypical infection.  Push fluids, rest, cheratussin for cough. Update if not improving with treatment. Pt agrees with plan.          Follow up plan: Return if symptoms worsen or fail to improve.

## 2015-03-15 ENCOUNTER — Telehealth: Payer: Self-pay | Admitting: Family Medicine

## 2015-03-15 NOTE — Telephone Encounter (Signed)
 [facility] - Client TELEPHONE ADVICE RECORD [facility]  Patient Name: Tricia DemarkSHANNON COBB  DOB: 09/28/1966    Initial Comment Caller states she pulled a tick off her arm a week ago. Her arm is still red and itchy where the tick was.    Nurse Assessment  Nurse: Phylliss Bobowe, RN, Synetta FailAnita Date/Time (Eastern Time): 03/15/2015 4:47:07 PM  Confirm and document reason for call. If symptomatic, describe symptoms. ---Caller states she pulled a tick off her arm a week ago. Her arm is still red and itchy where the tick was. caller stated that she is not sure that she had gotten the head of the tick and has no black aor dark areas on the no fever and has been having a stiff neck and has been bale to bend her head to her chest and has been red on the left arm on the forearm on the outside of the arm and has been having and has a small hole in her arm area  Has the patient traveled out of the country within the last 30 days? ---No  Does the patient require triage? ---Yes  Related visit to physician within the last 2 weeks? ---No  Does the PT have any chronic conditions? (i.e. diabetes, asthma, etc.) ---No  Did the patient indicate they were pregnant? ---No     Guidelines    Guideline Title Affirmed Question Affirmed Notes  Tick Bite [1] Scab is present AND [2] it drains pus or increases in size (all triage questions negative)    Final Disposition User   [facility] Rowe, RN, Synetta FailAnita

## 2015-03-16 NOTE — Telephone Encounter (Signed)
Agreed, the area can remain irritated for days afterward, even if no alarming process is going on.  Fu prn.  Thanks.

## 2015-03-16 NOTE — Telephone Encounter (Signed)
PLEASE NOTE: All timestamps contained within this report are represented as [place] Time. CONFIDENTIALTY NOTICE: This fax transmission is intended only for the addressee. It contains information that is legally privileged, confidential or otherwise protected from use or disclosure. If you are not the intended recipient, you are strictly prohibited from reviewing, disclosing, copying using or disseminating any of this information or taking any action in reliance on or regarding this information. If you have received this fax in error, please notify us immediately by telephone so that we can arrange for its return to us. Phone: [phone], Toll-Free: [phone], Fax: [phone] Page: 1 of 2 Call Id: [id number] [facility] Client TELEPHONE ADVICE RECORD [facility] Patient Name: Tricia [name] Gender: Female DOB: 11/02/1966 Age: [age] Y 2 M 27 D Return Phone Number: [phone] (Primary) Address: [address] [place]Zip: [place] [id number] Client [facility] Client [facility] Day Physician Raechel Acheuncan, Shaw Contact Type Call Call Type Triage / Clinical Relationship To Patient Self Appointment Disposition EMR Appointment Not Necessary Info pasted into Epic Yes Return Phone Number [phone] (Primary) Chief Complaint Insect Bite Initial Comment Caller states she pulled a tick off her arm a week ago. Her arm is still red and itchy where the tick was. PreDisposition Call Doctor Nurse Assessment Nurse: Phylliss Bobowe, RN, Synetta FailAnita Date/Time Lamount Cohen(Eastern Time): 03/15/2015 4:47:07 PM Confirm and document reason for call. If symptomatic, describe symptoms. ---Caller states she pulled a tick off her arm a week ago. Her arm is still red and itchy where the tick was. caller stated that she is not sure that she had gotten the head of the tick and has no black aor dark areas on the no  fever and has been having a stiff neck and has been bale to bend her head to her chest and has been red on the left arm on the forearm on the outside of the arm and has been having and has a small hole in her arm area Has the patient traveled out of the country within the last 30 days? ---No Does the patient require triage? ---Yes Related visit to physician within the last 2 weeks? ---No Does the PT have any chronic conditions? (i.e. diabetes, asthma, etc.) ---No Did the patient indicate they were pregnant? ---No Guidelines Guideline Title Affirmed Question Affirmed Notes Nurse Date/Time (Eastern Time) Tick Bite [1] Scab is present AND [2] it drains pus or increases in size (all triage questions negative) Phylliss Bobowe, RN, Synetta FailAnita 03/15/2015 4:49:40 PM Disp. Time Lamount Cohen(Eastern Time) Disposition Final User 03/15/2015 4:53:14 PM Home Care Yes Phylliss Bobowe, RN, Synetta FailAnita PLEASE NOTE: All timestamps contained within this report are represented as [place] Standard Time. CONFIDENTIALTY NOTICE: This fax transmission is intended only for the addressee. It contains information that is legally privileged, confidential or otherwise protected from use or disclosure. If you are not the intended recipient, you are strictly prohibited from reviewing, disclosing, copying using or disseminating any of this information or taking any action in reliance on or regarding this information. If you have received this fax in error, please notify us immediately by telephone so that we can arrange for its return to us. Phone: [phone], Toll-Free: [phone], Fax: [phone] Page: 2 of 2 Call Id: [id number] Caller Understands: Yes Disagree/Comply: Comply Care Advice Given Per Guideline HOME CARE: You should be able to treat this at home. * Sometimes a small infected sore can develop at the  site of a cut, scratch, insect bite, or sting. REASSURANCE - INFECTED SORE: * Small infected sores usually get better with regular cleansing and  use of an antibiotic ointment. * The typical appearance is a sore smaller than 1 inch (2.5 cm) in diameter. It is often covered by a soft, 'honey-yellow' or yellow-brown crust or scab. Sometimes the scab may drain a tiny amount of pus or yellow fluid. Usually there is minimal to no pain. CLEANING: * Wash the area 2-3 times daily with antibacterial soap and warm water. * Gently remove any scab. The bacteria live underneath the scab. You may need to soak the scab off by placing a warm wet washcloth (or gauze) on the sore for 10 minutes. * Apply an antibiotic ointment 3 times per day. ANTIBIOTIC OINTMENT: * Cover the sore with a Band-Aid to prevent scratching and spread. * Use Bacitracin ointment (OTC in [place] or Polysporin ointment (OTC in [place]) or one that you already have. AVOID PICKING: Avoid scratching and picking. This can spread a skin infection. CALL BACK IF: * Fever occurs * Spreading redness or a red streak occurs * Sore increases in size * Sore not improving after 2 days using antibiotic ointment * Sore not completely healed in 7 days (1 week) * New sore appears * You become worse. CARE ADVICE given per Tick Bites (Adult) guideline. After Care Instructions Given Call Event Type User Date / Time Description

## 2015-03-16 NOTE — Telephone Encounter (Signed)
Left detailed message on voicemail.  

## 2015-04-12 ENCOUNTER — Other Ambulatory Visit: Payer: Self-pay | Admitting: Family Medicine

## 2015-04-12 NOTE — Telephone Encounter (Signed)
Received refill request electronically from pharmacy. Last refill 01/12/15 #30/2 Last office visit 02/06/15/acute Is it okay to refill medication?

## 2015-04-12 NOTE — Telephone Encounter (Signed)
Please call in.  Thanks.   

## 2015-04-13 NOTE — Telephone Encounter (Signed)
Medication phoned to pharmacy.  

## 2015-06-16 ENCOUNTER — Telehealth: Payer: Self-pay | Admitting: *Deleted

## 2015-06-16 NOTE — Telephone Encounter (Signed)
This is hearsay, so we can't act on it yet.   I would get her in for a appointment soon, ie not emergently this weekend.

## 2015-06-16 NOTE — Telephone Encounter (Signed)
Patient's brother, Xian Apostol, Montez Hageman (who is also a patient here) called to report the problems that this patient has developed.  Patient's brother did not ask for any information on the patient, only wanted to report this situation.  Brother says patient is an alcoholic and is taking prescriptioned medications simultaneously.  Brother is asking that we please not prescribe any more medications that would contribute to this problem. Patient has ran away and now has come back and Brett Canales (brother) says his mother is feeling sorry for the patient and doesn't want to lose her daughter and he would not be surprised to hear that Colisha had asked her mother to try to get the medications and give them to her and that her mother [name] who is a patient here) (in this high anxiety state) may try to do it.  He therefore warns of prescribing these medications to [facility] as well.   Brett Canales reports that the patient has lost her job and is about to lose her house, she has been arrested for DWI's, for being drunk and naked on the street, and...well, you name it.  Brett Canales says Carollee Herter won't stop until she hits rock bottom and that he doesn't know why she isn't already there but her level of rock bottom is apparently lower than most. She will soon be homeless but her mother will probably take her in.  Brother states that he and his other sister are working on going before the magistrate to get her committed.  Brother provided his cell phone number and says we are welcomed to call him and that if Anishka comes to request medication, he is not opposed to our telling her that he has spoken with [place].  I told the brother Brett Canales) that we would try not to go to that extent but appreciated his permission to do so if necessary.

## 2015-06-19 NOTE — Telephone Encounter (Signed)
Left message on patient's voicemail to return call asking patient to schedule appointment stating "it's been about 6 months since she has been seen here in the clinic".

## 2015-06-19 NOTE — Telephone Encounter (Signed)
Appointment scheduled, Wednesday, August 10 at 3:30 pm

## 2015-06-21 ENCOUNTER — Ambulatory Visit (INDEPENDENT_AMBULATORY_CARE_PROVIDER_SITE_OTHER): Payer: No Typology Code available for payment source | Admitting: Family Medicine

## 2015-06-21 ENCOUNTER — Encounter: Payer: Self-pay | Admitting: Family Medicine

## 2015-06-21 VITALS — BP 120/70 | HR 68 | Temp 98.2°F | Wt 184.2 lb

## 2015-06-21 DIAGNOSIS — R202 Paresthesia of skin: Secondary | ICD-10-CM

## 2015-06-21 DIAGNOSIS — F1010 Alcohol abuse, uncomplicated: Secondary | ICD-10-CM

## 2015-06-21 MED ORDER — TRAZODONE HCL 50 MG PO TABS: 25.0000 mg | ORAL_TABLET | Freq: Every evening | ORAL | Status: DC | PRN

## 2015-06-21 NOTE — Assessment & Plan Note (Signed)
I called and LMOVM at [facility] about the outpatient program, gave the number to the patient for her to call for f/u.  Encouraged cessation, since she has not been drinking for about 3 days now.   She has no tremor, no sign of w/d.  Stop ambien, change to trazodone.  Can add on melatonin if needed.   She'll have to consider her financial and housing situation.  She is trying to get prev DUI charge address.   In meantime, still okay for outpatient f/u.  No SI/HI.   Continue SSRI.   Unclear if the tingling in the feet is etoh related, check basic labs today.  D/w pt.  >25 minutes spent in face to face time with patient, >50% spent in counselling or coordination of care.

## 2015-06-21 NOTE — Patient Instructions (Signed)
Go to the lab on the way out.  We'll contact you with your lab report. Stop ambien.  Change to trazodone.  You can add on  of melatonin if needed.  Call [phone] and ask for the outpatient alcohol program.  Take care.  Glad to see you.

## 2015-06-21 NOTE — Progress Notes (Signed)
Pre visit review using our clinic review tool, if applicable. No additional management support is needed unless otherwise documented below in the visit note.  No periods not in 5 months.  Off OCPs.  She had prev hot flashes and skipped periods before the absence of periods now.    etoh f/u.   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   She has a pending DUI from 3 years ago.  She wasn't driving at the time, wasn't behind the wheel, but was at the scene.   No other DUIs in the meantime.  She hasn't driven while impaired, per patient.   She had gotten back to 2-3 drinks in a day, per patient.  Patient minimizes the total, according to her mother (at the [facility]).   She has recently been staying with her mother, was drinking 2-3 drinks per day then.  Since then, she went back home and drank more per day.   She is still living her townhouse.  Her mother has been helping her financially.  Her mother isn't going to continue with helping with her finances.   Mother believes the alcohol is affecting the patient.   She has noted weight loss with dec in appetite.  With tingling noted in her feet.   No SI/HI.  She isn't sure about her plans to quit drinking.  Mother was asking about [facility] for a 30 day program.    Meds, vitals, and allergies reviewed.   ROS: See HPI.  Otherwise, noncontributory.  GEN: nad, alert and oriented HEENT: mucous membranes moist NECK: supple w/o LA CV: rrr PULM: ctab, no inc wob ABD: soft, +bs EXT: no edema SKIN: no acute rash Sensation wnl BLE

## 2015-06-22 ENCOUNTER — Telehealth (HOSPITAL_COMMUNITY): Payer: Self-pay | Admitting: Psychology

## 2015-06-22 LAB — COMPREHENSIVE METABOLIC PANEL
ALT: 45 U/L — AB (ref 0–35)
AST: 140 U/L — ABNORMAL HIGH (ref 0–37)
Albumin: 4.3 g/dL (ref 3.5–5.2)
Alkaline Phosphatase: 67 U/L (ref 39–117)
BUN: 7 mg/dL (ref 6–23)
CALCIUM: 10.2 mg/dL (ref 8.4–10.5)
CHLORIDE: 98 meq/L (ref 96–112)
CO2: 27 meq/L (ref 19–32)
CREATININE: 0.78 mg/dL (ref 0.40–1.20)
GFR: 83.6 mL/min (ref >60.00)
Glucose, Bld: 110 mg/dL — ABNORMAL HIGH (ref 70–99)
Potassium: 4.5 mEq/L (ref 3.5–5.1)
Sodium: 136 mEq/L (ref 135–145)
TOTAL PROTEIN: 7.8 g/dL (ref 6.0–8.3)
Total Bilirubin: 1.1 mg/dL (ref 0.2–1.2)

## 2015-06-22 LAB — CBC WITH DIFFERENTIAL/PLATELET
Basophils Absolute: 0 10*3/uL (ref 0.0–0.1)
Basophils Relative: 0.4 % (ref 0.0–3.0)
EOS PCT: 2.2 % (ref 0.0–5.0)
Eosinophils Absolute: 0.1 10*3/uL (ref 0.0–0.7)
HCT: 42.9 % (ref 36.0–46.0)
HEMOGLOBIN: 14.8 g/dL (ref 12.0–15.0)
Lymphocytes Relative: 19.9 % (ref 12.0–46.0)
Lymphs Abs: 1.2 10*3/uL (ref 0.7–4.0)
MCHC: 34.4 g/dL (ref 30.0–36.0)
MCV: 108.8 fl — ABNORMAL HIGH (ref 78.0–100.0)
MONOS PCT: 10.5 % (ref 3.0–12.0)
Monocytes Absolute: 0.7 10*3/uL (ref 0.1–1.0)
Neutro Abs: 4.2 10*3/uL (ref 1.4–7.7)
Neutrophils Relative %: 67 % (ref 43.0–77.0)
PLATELETS: 171 10*3/uL (ref 150.0–400.0)
RBC: 3.95 Mil/uL (ref 3.87–5.11)
RDW: 14.5 % (ref 11.5–15.5)
WBC: 6.3 10*3/uL (ref 4.0–10.5)

## 2015-06-22 LAB — TSH: TSH: 2.67 u[IU]/mL (ref 0.35–4.50)

## 2015-06-23 ENCOUNTER — Other Ambulatory Visit: Payer: Self-pay | Admitting: Family Medicine

## 2015-06-23 ENCOUNTER — Telehealth (HOSPITAL_COMMUNITY): Payer: Self-pay | Admitting: Psychology

## 2015-06-23 DIAGNOSIS — R740 Nonspecific elevation of levels of transaminase and lactic acid dehydrogenase [LDH]: Principal | ICD-10-CM

## 2015-06-23 DIAGNOSIS — R7401 Elevation of levels of liver transaminase levels: Secondary | ICD-10-CM

## 2015-06-27 ENCOUNTER — Ambulatory Visit (INDEPENDENT_AMBULATORY_CARE_PROVIDER_SITE_OTHER): Payer: No Typology Code available for payment source | Admitting: Family Medicine

## 2015-06-27 ENCOUNTER — Other Ambulatory Visit (HOSPITAL_COMMUNITY)
Admission: RE | Admit: 2015-06-27 | Discharge: 2015-06-27 | Disposition: A | Discharge status: Home / Self Care | Payer: No Typology Code available for payment source | Source: Ambulatory Visit | Attending: Family Medicine | Admitting: Family Medicine

## 2015-06-27 ENCOUNTER — Encounter: Payer: Self-pay | Admitting: Family Medicine

## 2015-06-27 VITALS — BP 122/80 | HR 64 | Temp 97.8°F | Wt 186.2 lb

## 2015-06-27 DIAGNOSIS — Z1151 Encounter for screening for human papillomavirus (HPV): Secondary | ICD-10-CM | POA: Insufficient documentation

## 2015-06-27 DIAGNOSIS — N926 Irregular menstruation, unspecified: Secondary | ICD-10-CM

## 2015-06-27 DIAGNOSIS — N3010 Interstitial cystitis (chronic) without hematuria: Secondary | ICD-10-CM

## 2015-06-27 DIAGNOSIS — Z01419 Encounter for gynecological examination (general) (routine) without abnormal findings: Secondary | ICD-10-CM | POA: Insufficient documentation

## 2015-06-27 DIAGNOSIS — Z124 Encounter for screening for malignant neoplasm of cervix: Secondary | ICD-10-CM

## 2015-06-27 DIAGNOSIS — F1010 Alcohol abuse, uncomplicated: Secondary | ICD-10-CM

## 2015-06-27 LAB — POCT URINALYSIS DIPSTICK
BILIRUBIN UA: 1
Blood, UA: NEGATIVE
GLUCOSE UA: NEGATIVE
KETONES UA: NEGATIVE
Nitrite, UA: POSITIVE
PH UA: 6
Protein, UA: 0.15
Spec Grav, UA: >=1.03
Urobilinogen, UA: 4

## 2015-06-27 LAB — POCT URINE PREGNANCY: Preg Test, Ur: NEGATIVE

## 2015-06-27 MED ORDER — PHENAZOPYRIDINE HCL 200 MG PO TABS: 200.0000 mg | ORAL_TABLET | Freq: Three times a day (TID) | ORAL | Status: DC | PRN

## 2015-06-27 MED ORDER — ACYCLOVIR 400 MG PO TABS: 400.0000 mg | ORAL_TABLET | Freq: Two times a day (BID) | ORAL | Status: DC | PRN

## 2015-06-27 NOTE — Patient Instructions (Signed)
We'll contact you with your lab report. Change from valtex to acyclovir.   Use pyridium or AZO in the meantime.  Take care.

## 2015-06-27 NOTE — Progress Notes (Signed)
Pre visit review using our clinic review tool, if applicable. No additional management support is needed unless otherwise documented below in the visit note.  Due for pap.   Recently with dysuria.  Not burning but change in order noted.  upreg neg.    She has outpatient etoh f/u pending for Thursday.  Sober in the meantime per patient.   Meds, vitals, and allergies reviewed.   ROS: See HPI.  Otherwise, noncontributory.  nad Suprapubic area not ttp, no rebound.  Normal introitus for age, no external lesions, no vaginal discharge, mucosa pink and moist, no vaginal or cervical lesions, no vaginal atrophy, no friaility or hemorrhage, no adnexal masses or tenderness.  Chaperoned exam. Pap collected.

## 2015-06-28 DIAGNOSIS — Z124 Encounter for screening for malignant neoplasm of cervix: Secondary | ICD-10-CM | POA: Insufficient documentation

## 2015-06-28 NOTE — Assessment & Plan Note (Signed)
Outpatient f/u pending.

## 2015-06-28 NOTE — Assessment & Plan Note (Signed)
Check ucx.  This may not be a true acute cystitis.  AZO or pyridium in the meantime.  She agrees.

## 2015-06-28 NOTE — Assessment & Plan Note (Signed)
Pap smear collected, pending.

## 2015-06-29 ENCOUNTER — Encounter (HOSPITAL_COMMUNITY): Payer: Self-pay | Admitting: Licensed Clinical Social Worker

## 2015-06-29 ENCOUNTER — Other Ambulatory Visit: Payer: Self-pay | Admitting: Family Medicine

## 2015-06-29 LAB — URINE CULTURE

## 2015-06-29 LAB — CYTOLOGY - PAP

## 2015-06-29 MED ORDER — SULFAMETHOXAZOLE-TRIMETHOPRIM 800-160 MG PO TABS: 1.0000 | ORAL_TABLET | Freq: Two times a day (BID) | ORAL | Status: DC

## 2015-06-30 ENCOUNTER — Encounter: Payer: Self-pay | Admitting: *Deleted

## 2015-07-03 ENCOUNTER — Other Ambulatory Visit (HOSPITAL_COMMUNITY): Payer: Self-pay | Attending: Medical | Admitting: Psychology

## 2015-07-03 ENCOUNTER — Encounter (HOSPITAL_COMMUNITY): Payer: Self-pay | Admitting: Medical

## 2015-07-03 VITALS — BP 122/80 | HR 64 | Temp 97.8°F | Resp 16 | Wt 186.2 lb

## 2015-07-03 DIAGNOSIS — I472 Ventricular tachycardia, unspecified: Secondary | ICD-10-CM

## 2015-07-03 DIAGNOSIS — F411 Generalized anxiety disorder: Secondary | ICD-10-CM

## 2015-07-03 DIAGNOSIS — K589 Irritable bowel syndrome without diarrhea: Secondary | ICD-10-CM

## 2015-07-03 DIAGNOSIS — IMO0002 Reserved for concepts with insufficient information to code with codable children: Secondary | ICD-10-CM

## 2015-07-03 DIAGNOSIS — F10288 Alcohol dependence with other alcohol-induced disorder: Secondary | ICD-10-CM | POA: Insufficient documentation

## 2015-07-03 DIAGNOSIS — F4312 Post-traumatic stress disorder, chronic: Secondary | ICD-10-CM

## 2015-07-03 DIAGNOSIS — F3341 Major depressive disorder, recurrent, in partial remission: Secondary | ICD-10-CM

## 2015-07-03 DIAGNOSIS — Z6372 Alcoholism and drug addiction in family: Secondary | ICD-10-CM

## 2015-07-03 DIAGNOSIS — G4700 Insomnia, unspecified: Secondary | ICD-10-CM

## 2015-07-03 DIAGNOSIS — I4729 Other ventricular tachycardia: Secondary | ICD-10-CM

## 2015-07-03 DIAGNOSIS — D7589 Other specified diseases of blood and blood-forming organs: Secondary | ICD-10-CM | POA: Insufficient documentation

## 2015-07-03 DIAGNOSIS — Z91410 Personal history of adult physical and sexual abuse: Secondary | ICD-10-CM | POA: Insufficient documentation

## 2015-07-03 DIAGNOSIS — K7010 Alcoholic hepatitis without ascites: Secondary | ICD-10-CM

## 2015-07-03 NOTE — Progress Notes (Signed)
[name] is a 49 y.o. female patient. CD-IOP Orientation:The patient is a 49 yo single, Caucasian, female, seeking entry into the CD-IOP program for her alcoholism. She lives in [place] alone but stays with her mother, who also lives in [place], most of the time. She has one daughter who is 12 years old, a brother 16 and a sister 58, all live in [place]. The patient'[name] family (mother, daughter, sister, brother) wants her to get "help," and her mother told her she will stop helping her financially if she doesn't get "help." Patient has never been to treatment but was at [facility] in 2013 for suicidal thoughts. Patient has a pending DWI for 3 years. She also has 2 previous DWIs (2008, 2011). Patient saw Jake Seats, NP, from [facility] from 2012-2015 for medication management. She had to stop seeing her after she lost her insurance. Patient worked for [employer] for many years and resigned, before she was terminated in 2015. Patient has lost 40 pounds in the last 8 months, has IBS, Interstitial Cystitis, and Paroxysmal Ventricular Tachycardia. Patient takes Prozac, Trazadone, Topral, and Valtrex all prescribed by her PCP Dr. Crawford Givens, MD, [facility]. Patient minimizes her alcohol use and minimizes her symptoms of use. She drinks daily but says she only drinks 2-3 drinks (2shots each drink) or 2-3 glasses of wine, never on the same day. Patient was unable to identify any maternal family hx of addiction. She has limited contact with her father (1x per month) and will ask him about his family history. Patient reports she has tried AA a few times but didn't particularly like it. 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 Patient has tried to stop drinking on her own but stays clean for 5-6 days, gets lonely and depressed, and starts drinking again.  Patient does not appear very motivated for treatment but  doesn't want to be cut off financially from her mother. She is very personable and should be a good group member. All paperwork was signed and patient will begin CD-IOP on Monday.        Tricia [name],Tricia [name], Licensed Cli
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
## 2015-07-04 ENCOUNTER — Other Ambulatory Visit (HOSPITAL_COMMUNITY): Payer: Self-pay

## 2015-07-05 ENCOUNTER — Encounter (HOSPITAL_COMMUNITY): Payer: Self-pay | Admitting: Licensed Clinical Social Worker

## 2015-07-05 ENCOUNTER — Other Ambulatory Visit (HOSPITAL_COMMUNITY): Payer: Self-pay | Admitting: Psychology

## 2015-07-05 DIAGNOSIS — F4312 Post-traumatic stress disorder, chronic: Secondary | ICD-10-CM

## 2015-07-05 DIAGNOSIS — K7010 Alcoholic hepatitis without ascites: Secondary | ICD-10-CM

## 2015-07-05 DIAGNOSIS — F10288 Alcohol dependence with other alcohol-induced disorder: Secondary | ICD-10-CM

## 2015-07-06 ENCOUNTER — Other Ambulatory Visit (HOSPITAL_COMMUNITY): Payer: Self-pay | Admitting: Licensed Clinical Social Worker

## 2015-07-06 DIAGNOSIS — F3341 Major depressive disorder, recurrent, in partial remission: Secondary | ICD-10-CM

## 2015-07-06 DIAGNOSIS — F10288 Alcohol dependence with other alcohol-induced disorder: Secondary | ICD-10-CM
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
## 2015-07-07 ENCOUNTER — Other Ambulatory Visit (HOSPITAL_COMMUNITY): Payer: Self-pay

## 2015-07-07 ENCOUNTER — Encounter (HOSPITAL_COMMUNITY): Payer: Self-pay | Admitting: Licensed Clinical Social Worker

## 2015-07-07 ENCOUNTER — Encounter (HOSPITAL_COMMUNITY): Payer: Self-pay | Admitting: Psychology

## 2015-07-07 NOTE — Progress Notes (Signed)
Daily Group Progress Note  Program: CD-IOP   Group Time: 1-2:30 pm  Participation Level: Active  Behavioral Response: Sharing  Type of Therapy: Process Group  Topic: Process: the first part of group was spent in process. After checking in, members shared about any issues or concerns they are dealing with in early recovery. A new group member was present and she was asked to introduce herself briefly. She shared about her alcohol use over the years and the problems that had escalated, as a result of her drinking, over the past couple of years. She admitted she was anxious about being here, but stated that she felt very welcomed and she felt much better as the session had progressed.  Group Time: 2:45- 4pm  Participation Level: Active  Behavioral Response: Appropriate  Type of Therapy: Psycho-education Group  Topic: "Resentments, Part II"/Graduation: the second half of group included a continued discussion on Resentments. Members had been provided a handout on Monday and a psycho-ed had been provided educating members on the dangers that resentments represent in early recovery. Today, one member shared at length about the resentment he had towards an aunt and why he had to let go of it. At the end of the session a graduation ceremony was held for a member successfully completing today. His wife and 48 yo daughter arrived to be present for this ceremony. There were tears from family as group members said their 'good-byes'. The patient's family also shared about their pride and love for their family member.   Summary: The patient appeared today for just her second group session. She reported that on Monday she had been very nervous about being here, but after the group session concluded she had felt very good about being here and very comfortable. "I love it here", she explained. She reminded the group that she is currently living with her mother and step-father. She helps her mother care  for her husband, who has dementia, and it also keeps her from drinking. She reported she could relate to another group member about how he is feeling so uncomfortable.  The patient reported she is not sleeping because she had to stop taking Ambien. She had been taking this medication for almost 20 years and she realized it was going to take time to get back into a more natural condition. In the meantime, though, she is very tired. The patient reported in addition to her low energy level, she has also struggled with depression for years. I wondered if her depressive symptoms started when she began drinking? She was uncertain whether they were associated. During the psycho-ed, the patient admitted she has some anger towards some of her family members. They have criticized her for years and she resents their attitude towards her. She has felt unfairly criticized for her drinking while everyone else in the family drinks to excess. Another group member encouraged her to meet with her and some other members at the 5:30 meeting at the [employer]. The patient admitted she had had some bad experiences with AA in the past, but she would be willing to try again. She agreed with another member that she would feel better if she went with someone she knew. The patient was relaxed and shared openly in this session. She admitted that the graduation ceremony was very touching and she had cried as the family members shared their feelings to the loved one graduating. She likes the warmth and safety that this group provides and she responded  well to this intervention. Her sobriety date remains 8/22.   Family Program: Family present? No   Name of family member(s):   UDS collected: No Results:  AA/NA attended?: No, but the patient is new to the program.  Sponsor?: No   Veasna Santibanez, LCAS
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
## 2015-07-10 ENCOUNTER — Other Ambulatory Visit (HOSPITAL_COMMUNITY): Payer: Self-pay | Admitting: Psychology

## 2015-07-10 ENCOUNTER — Other Ambulatory Visit (HOSPITAL_COMMUNITY): Payer: Self-pay | Admitting: Medical

## 2015-07-10 DIAGNOSIS — F4312 Post-traumatic stress disorder, chronic: Secondary | ICD-10-CM

## 2015-07-10 DIAGNOSIS — IMO0002 Reserved for concepts with insufficient information to code with codable children: Secondary | ICD-10-CM

## 2015-07-10 DIAGNOSIS — G4700 Insomnia, unspecified: Secondary | ICD-10-CM

## 2015-07-10 DIAGNOSIS — D7589 Other specified diseases of blood and blood-forming organs: Secondary | ICD-10-CM

## 2015-07-10 DIAGNOSIS — F10288 Alcohol dependence with other alcohol-induced disorder: Secondary | ICD-10-CM

## 2015-07-10 DIAGNOSIS — F411 Generalized anxiety disorder: Secondary | ICD-10-CM

## 2015-07-10 DIAGNOSIS — Z6372 Alcoholism and drug addiction in family: Secondary | ICD-10-CM

## 2015-07-10 DIAGNOSIS — K7010 Alcoholic hepatitis without ascites: Secondary | ICD-10-CM

## 2015-07-10 DIAGNOSIS — F3341 Major depressive disorder, recurrent, in partial remission: Secondary | ICD-10-CM

## 2015-07-10 MED ORDER — FLUOXETINE HCL 20 MG PO TABS: 40.0000 mg | ORAL_TABLET | Freq: Every day | ORAL | Status: DC

## 2015-07-10 MED ORDER — TRAZODONE HCL 150 MG PO TABS: 150.0000 mg | ORAL_TABLET | Freq: Every evening | ORAL | Status: DC | PRN
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
## 2015-07-11 ENCOUNTER — Encounter (HOSPITAL_COMMUNITY): Payer: Self-pay | Admitting: Licensed Clinical Social Worker

## 2015-07-11 ENCOUNTER — Other Ambulatory Visit (HOSPITAL_COMMUNITY): Payer: Self-pay | Admitting: Medical

## 2015-07-11 ENCOUNTER — Encounter (HOSPITAL_COMMUNITY): Payer: Self-pay | Admitting: Psychology
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
## 2015-07-12 ENCOUNTER — Other Ambulatory Visit (HOSPITAL_COMMUNITY): Payer: Self-pay | Admitting: Psychology

## 2015-07-12 DIAGNOSIS — F1020 Alcohol dependence, uncomplicated: Secondary | ICD-10-CM

## 2015-07-12 DIAGNOSIS — F4312 Post-traumatic stress disorder, chronic: Secondary | ICD-10-CM

## 2015-07-12 DIAGNOSIS — K7010 Alcoholic hepatitis without ascites: Secondary | ICD-10-CM

## 2015-07-12 DIAGNOSIS — F3341 Major depressive disorder, recurrent, in partial remission: Secondary | ICD-10-CM

## 2015-07-12 NOTE — Progress Notes (Signed)
TYNEKA SCAFIDI is a 49 y.o. female patient. CD-IOP Individual Counseling Session:  Met with pt for her weekly individual counseling session. Pt reported she took a Oxycodone for her sunburn pain on Sunday. This is a relapse. Pt reports she did not know this was a relapse. She uses alcohol and has never used pills. She was reminded she can't take any mood or mind altering chemicals while in the program. Pt reported she will share this information in group tomorrow. Pt was given an anger  Management workbook to complete weekly and we will role play some anger coping skills during our weekly sessions. Pt reports she has a lot of resentments and we have begun working on skills to work through the resentments in group sessions. In addition we will work on resentments in individual sessions. Processed with the pt trust issues within her family and steps to gain their trust back. Pt went to her first meeting Monday night and got a lot out of the meeting on "Hope." She met 2 group members at the meeting. She has picked out other meetings she will attend. Pt will continue to be monitored in her early recovery. Her sobriety date remains the same.        MACKENZIE,[name], Licensed Cli

## 2015-07-13 ENCOUNTER — Other Ambulatory Visit (HOSPITAL_COMMUNITY): Payer: Self-pay | Attending: Medical | Admitting: Licensed Clinical Social Worker

## 2015-07-13 ENCOUNTER — Encounter (HOSPITAL_COMMUNITY): Payer: Self-pay | Admitting: Psychology

## 2015-07-13 DIAGNOSIS — F1210 Cannabis abuse, uncomplicated: Secondary | ICD-10-CM | POA: Insufficient documentation

## 2015-07-13 DIAGNOSIS — G4700 Insomnia, unspecified: Secondary | ICD-10-CM | POA: Insufficient documentation

## 2015-07-13 DIAGNOSIS — F411 Generalized anxiety disorder: Secondary | ICD-10-CM | POA: Insufficient documentation

## 2015-07-13 DIAGNOSIS — F1020 Alcohol dependence, uncomplicated: Secondary | ICD-10-CM

## 2015-07-13 DIAGNOSIS — F4312 Post-traumatic stress disorder, chronic: Secondary | ICD-10-CM | POA: Insufficient documentation

## 2015-07-13 DIAGNOSIS — F331 Major depressive disorder, recurrent, moderate: Secondary | ICD-10-CM | POA: Insufficient documentation

## 2015-07-13 NOTE — Progress Notes (Signed)
    Daily Group Progress Note  Program: CD-IOP   Group Time: 1-2:30 pm  Participation Level: Active  Behavioral Response: Appropriate and Sharing  Type of Therapy: Process Group  Topic: Process: the first part of group was spent in process. Members shared about obstacles and challenges in early recovery. Everyone had attended at least one 12-step meeting since we last met. There was good disclosure and feedback among group members. One of the newer members shared that she had taken a pain pill on Sunday evening and was informed by her counselor that this was regarded as a relapse. She was very upset at having to change her sobriety date, but this event generated a good review of the disease concept of addiction.   Group Time: 2:45- 4pm  Participation Level: Active  Behavioral Response: Appropriate  Type of Therapy: Psycho-education Group  Topic: Psycho-Ed: the second half of group was spent in a psycho-ed. This session followed up the one in the last session addressing Core Beliefs. Today, the way that one's Core Beliefs are developed and whey they are resistant to changing. Members disclosed some of their core beliefs, all of which were negative, and the group worked together on identifying the ways that these are proven incorrect in the member's life. One drug test was collected from the member who was absent on Monday.  Summary: The patient reported she had attended the [place] meeting at the [facility] on Monday evening after this group session. She reported it was nice to have a friendly and familiar face to greet her at the meeting. The patient also noted that she was present when another group member picked up his 30 day chip and everyone had applauded this accomplishment. She admitted that she had felt a little uncomfortable in the area of town where the meeting was held because she had partied and had some painful memories from being in that area. The patient also shared that she had  not known it at the time, but she had actually taken a pain pill on Sunday evening because the pain of her sunburn was so bad. She had been told by her counselor that it would be considered a relapse. The patient admitted she didn't realize she couldn't take a pain pill while in this program. She cried as she disclosed this to her fellow group members. They provided some reinforcement, though, and reminded her that she had not tried to deceive or sneak this pain pill by the program. In the psycho-ed, the patient offered her own core beliefs having completed the homework given her by her counselor. She identified "not feeling good enough" as one of her primary core beliefs and this related to her 'love relationships'. This belief was broken down and discussed at length by the entire group. The fallacy of this belief was revealed, but to the patient, she admitted that it seemed very real at the time. The patient provided good feedback and responded well to this intervention. The patient's new sobriety date is 8/29.   Family Program: Family present? No   Name of family member(s):   UDS collected: No Results:   AA/NA attended?: [place]  Sponsor?: No, but she is new to the program   Jayion Schneck, LCAS

## 2015-07-14 ENCOUNTER — Other Ambulatory Visit (HOSPITAL_COMMUNITY): Payer: Self-pay

## 2015-07-14 ENCOUNTER — Encounter (HOSPITAL_COMMUNITY): Payer: Self-pay | Admitting: Licensed Clinical Social Worker

## 2015-07-14 NOTE — Progress Notes (Signed)
    Daily Group Progress Note  Program: CD-IOP   Group Time: 1-2:30  Participation Level: Active  Behavioral Response: Appropriate and Sharing  Type of Therapy: Process Group  Topic:  The first part of group was spent in process where group members shared about obstacles and challenges in early recovery. There was good disclosure and feedback among group members. Today is the beginning of a holiday weekend and group members shared their weekend plans of recovery. A new group member joined the program today. She was very tearful and reported she was feeling overwhelmed. She left the group shortly after beginning to share about herself. She eventually came back to the group but was quiet and tearful the rest of the group.    Group Time: 2:45-4  Participation Level: Active  Behavioral Response: Appropriate and Sharing  Type of Therapy: Psycho-education Group  Topic: The second half of group focused on self-esteem. The last session which addressed core beliefs, today group members learned how to move forward from their negative core beliefs to working on their self-esteem. All group members shared their self-esteem was very low. Group members participated in a self-esteem guided meditation. Everyone said they enjoyed the meditation as well as the added positive affirmations they were able to repeat to themselves.      Summary: Pt reported eagerly she went to a meeting last night and saw 2 other group members. She said it's easier to go to meetings when you see others you know. She also reported she worked 2 hours last night on anger-management and self-esteem assignments. Pt [place]. Pt read her PAWS handout to the group and explained what symptoms she was experiencing. Other group members thanked her for reading the handout because they were feeling the same symptoms. Pt reported, "My self-esteem is in the dirt." As an  adult she has made lots of poor choices which has diminished her self-esteem. She enjoyed the self-esteem meditation. Pt benefitted from the intervention. Her sobriety date is 8/29.   Family Program: Family present? No   Name of family member(s):   UDS collected: No Results:   AA/NA attended?: YesWednesday  Sponsor?: No   Briah Nary S, Licensed Cli

## 2015-07-15 LAB — TOXASSURE SELECT 13 (MW), URINE: PDF: 0

## 2015-07-18 ENCOUNTER — Other Ambulatory Visit (HOSPITAL_COMMUNITY): Payer: Self-pay

## 2015-07-18 ENCOUNTER — Encounter (HOSPITAL_COMMUNITY): Payer: Self-pay | Admitting: Licensed Clinical Social Worker
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
## 2015-07-19 ENCOUNTER — Other Ambulatory Visit (HOSPITAL_COMMUNITY): Payer: Self-pay | Admitting: Licensed Clinical Social Worker

## 2015-07-19 DIAGNOSIS — F1020 Alcohol dependence, uncomplicated: Secondary | ICD-10-CM

## 2015-07-19 MED ORDER — ACAMPROSATE CALCIUM 333 MG PO TBEC: 666.0000 mg | DELAYED_RELEASE_TABLET | Freq: Three times a day (TID) | ORAL | Status: DC

## 2015-07-19 NOTE — Progress Notes (Signed)
07/19/15 Encounter: 3 PM - 4 PM  Purpose: To address understanding of finding one's life purpose. Intervention: Group took part in activity & open discussion of how identity & action can fulfill purpose in life. Effect: Tricia [name] shared how she works through depression via service to others, while aiming for emotional independence.  Donnamarie Rossetti CPSS

## 2015-07-19 NOTE — Progress Notes (Signed)
This encounter was created in error - please disregard.

## 2015-07-20 ENCOUNTER — Encounter (HOSPITAL_COMMUNITY): Payer: Self-pay | Admitting: Licensed Clinical Social Worker

## 2015-07-20 ENCOUNTER — Other Ambulatory Visit (HOSPITAL_COMMUNITY): Payer: Self-pay | Admitting: Psychology

## 2015-07-20 DIAGNOSIS — F1020 Alcohol dependence, uncomplicated: Secondary | ICD-10-CM

## 2015-07-20 DIAGNOSIS — F3341 Major depressive disorder, recurrent, in partial remission: Secondary | ICD-10-CM

## 2015-07-20 DIAGNOSIS — F4312 Post-traumatic stress disorder, chronic: Secondary | ICD-10-CM
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
## 2015-07-21 ENCOUNTER — Encounter (HOSPITAL_COMMUNITY): Payer: Self-pay | Admitting: Psychology

## 2015-07-21 ENCOUNTER — Other Ambulatory Visit (HOSPITAL_COMMUNITY): Payer: Self-pay

## 2015-07-21 NOTE — Progress Notes (Signed)
    Daily Group Progress Note  Program: CD-IOP   Group Time: 1-2:30 pm  Participation Level: Active  Behavioral Response: Appropriate and Sharing  Type of Therapy: Process Group  Topic: Process: The first half of group was spent in process. Members shared about issues and challenges in early recovery.  A practicum student was present and observed the group, but did not engage in the discussion. A new member also presented this afternoon. She was teary and was resistant to sharing about herself. Finally, the woman talked about her alcohol use and admitted, upon further questioning, that she had drunk today. She accompanied a counselor out of the session and was evaluated by a nurse and psychiatrist.   Group Time: 2:45- 4pm  Participation Level: Active  Behavioral Response: Sharing  Type of Therapy: Psycho-education Group  Topic: "Positive Qualities Record". List your Positive Qualities. The second half of group was spent in a psycho-ed. A handout was provided that addressed self-esteem. It asked that members make a list of positive qualities be identified. After a few minutes to consider this question and complete the documentation, members shared the positive qualities they had identified. The ensuing discussion was very lively with members opening up about some of their biggest accomplishments and the things they felt the most pride in.  In some instances, their disclosures were very revealing.   Summary: The patient reported she had attended the 5:30 "Happy Hour" AA meeting at the [employer] yesterday evening after the group session. She admitted being very tearful after group yesterday because 4 group members had not appeared and at least 1 had relapsed. The absence of so many had been very painful and powerful to this member and, apparently, other members as well. The patient reported she had gone home after the meeting and written in her journal as instructed by her counselor here in  the CD-IOP. The patient reported she had written for almost an hour and was surprised by the intensity of her feelings and getting them out on paper. In the second half of group, the patient was able to identify some of her positive qualities. She was quick to note that she had already done this in one of her homework assignments so she could identify them easier now than she once could. Some of the qualities she identified included:  being non-judgmental. She explained that she has never really judged others because they are different and has been very accepting of all sorts of people. She described this as being 'culturally diverse'. Other qualities included being: faithful, a good listener and being a IT sales professional. "I don't give up", she explained. She went on to identify being 'family oriented and a caretaker'. The patient was engaged and very active in this session. She reported she feels good about herself and is making good progress in her sobriety and making important changes in her life. The patient provided good feedback to her fellow group members and responded well to this intervention. When asked about the upcoming weekend, the patient reported her intention to attend at least 2-3 AA meetings and also attend church on Sunday. The patient's sobriety remains 8/29.   Family Program: Family present? No   Name of family member(s):   UDS collected: No Results:   AA/NA attended?: YesWednesday  Sponsor?: No, but the patient is new to recovery and is seeking a sponsor.   Suhani Stillion, LCAS

## 2015-07-24 ENCOUNTER — Other Ambulatory Visit (HOSPITAL_COMMUNITY): Payer: Self-pay | Admitting: Medical

## 2015-07-24 ENCOUNTER — Other Ambulatory Visit (HOSPITAL_COMMUNITY): Payer: Self-pay | Admitting: Licensed Clinical Social Worker

## 2015-07-24 MED ORDER — FLUOXETINE HCL 40 MG PO CAPS: 40.0000 mg | ORAL_CAPSULE | Freq: Every day | ORAL | Status: DC

## 2015-07-24 MED ORDER — FLUOXETINE HCL 60 MG PO TABS: 60.0000 mg | ORAL_TABLET | Freq: Every day | ORAL | Status: DC

## 2015-07-25 ENCOUNTER — Other Ambulatory Visit (HOSPITAL_COMMUNITY): Payer: Self-pay

## 2015-07-25 ENCOUNTER — Telehealth: Payer: Self-pay | Admitting: Family Medicine

## 2015-07-25 NOTE — Telephone Encounter (Signed)
Called [name] at the [facility] for [facility] and requested an Opthamologist for Tricia [name] for an eye exam. Called the patient and told her we were faxing the request over and someone would be calling her to help make that Opthy appt.

## 2015-07-25 NOTE — Telephone Encounter (Signed)
Pt called stating she renewed her orange insurance card  and needs a referral to eye dr that takes the orange.

## 2015-07-26 ENCOUNTER — Encounter (HOSPITAL_COMMUNITY): Payer: Self-pay | Admitting: Licensed Clinical Social Worker

## 2015-07-26 ENCOUNTER — Other Ambulatory Visit (HOSPITAL_COMMUNITY): Payer: Self-pay | Admitting: Psychology

## 2015-07-26 DIAGNOSIS — F4312 Post-traumatic stress disorder, chronic: Secondary | ICD-10-CM

## 2015-07-26 DIAGNOSIS — F1020 Alcohol dependence, uncomplicated: Secondary | ICD-10-CM

## 2015-07-26 DIAGNOSIS — F3341 Major depressive disorder, recurrent, in partial remission: Secondary | ICD-10-CM
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
## 2015-07-27 ENCOUNTER — Other Ambulatory Visit (HOSPITAL_COMMUNITY): Payer: Self-pay | Admitting: Licensed Clinical Social Worker

## 2015-07-27 DIAGNOSIS — F1020 Alcohol dependence, uncomplicated: Secondary | ICD-10-CM

## 2015-07-28 ENCOUNTER — Other Ambulatory Visit (HOSPITAL_COMMUNITY): Payer: Self-pay

## 2015-07-28 ENCOUNTER — Telehealth: Payer: Self-pay | Admitting: Family Medicine

## 2015-07-28 ENCOUNTER — Encounter (HOSPITAL_COMMUNITY): Payer: Self-pay | Admitting: Psychology

## 2015-07-28 LAB — TOXASSURE SELECT 13 (MW), URINE: PDF: 0

## 2015-07-28 NOTE — Telephone Encounter (Signed)
Tricia [name] (mom) wanted to let you know what Tricia [name] is doing wonderful in her rehab  Tricia [name] wanted to thank you for setting everything up for them

## 2015-07-28 NOTE — Progress Notes (Signed)
    Daily Group Progress Note  Program: CD-IOP   Group Time: 1-2:30 pm  Participation Level: Active  Behavioral Response: Appropriate and Sharing  Type of Therapy: Psycho-education Group  Topic: Psycho-Ed: Visit with the Chaplain. The first half of group was spent in a psycho-ed. A chaplain from [facility] came to visit the group. He led a discussion on a quote from Kierkegaard that included the notion of living one's life forward. Group members shared what they felt about this idea and a lively and engaging discussion ensued.  Group Time: 2:45- 4pm  Participation Level: Active  Behavioral Response: Appropriate and Sharing  Type of Therapy: Process Group  Topic: Process: the second half of group was spent in process. A new group member was present and another one returned after a relapse and brief stay in detox. Members shared about challenges to their sobriety, including cravings, and the ways they managed to change their thoughts and remain sober. The returning member shared about her relapse and the consequences for herself and her family. The new group member introduced himself briefly and was well-received by his fellow group members.    Summary: The patient was engaged during the discussion with the chaplain. She admitted that she was 'afraid' of the future. When the difference between self-care and selfishness was identified, the patient admitted that she had never put herself first. She questioned the very thought, stating, "How do I put myself first"? The chaplain pointed out she had been taught from a young age that one put others, including children first, so it was not surprising that she didn't understand this. Group members provided her examples of basic self-care, but she remained perplexed about the very notion. In process, she reported she had attended 2 AA meetings since the last group session and had been present to see a fellow group member pick up her 60 day chip  last night. The patient made some good comments and she responded well to this intervention. She will continue to work with her counselor in their individual sessions to address the ways she will begin to care more about her own needs and not putting everyone else first. Her sobriety date is 9/11.   Family Program: Family present? No   Name of family member(s):   UDS collected: Yes Results:   AA/NA attended?: [place] and Tuesday  Sponsor?: No, but she is seeking a temporary sponsor   EVANS,ANN, LCAS

## 2015-07-29 ENCOUNTER — Encounter (HOSPITAL_COMMUNITY): Payer: Self-pay | Admitting: Licensed Clinical Social Worker

## 2015-07-29 NOTE — Telephone Encounter (Signed)
I'm very glad to hear that.  Thanks for the update.  I wish her the best in the future.
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
## 2015-07-31 ENCOUNTER — Other Ambulatory Visit (HOSPITAL_COMMUNITY): Payer: Self-pay | Admitting: Psychology

## 2015-07-31 ENCOUNTER — Other Ambulatory Visit (HOSPITAL_COMMUNITY): Payer: Self-pay | Admitting: Medical

## 2015-07-31 ENCOUNTER — Encounter (HOSPITAL_COMMUNITY): Payer: Self-pay | Admitting: Medical

## 2015-07-31 DIAGNOSIS — F10288 Alcohol dependence with other alcohol-induced disorder: Secondary | ICD-10-CM

## 2015-07-31 DIAGNOSIS — F331 Major depressive disorder, recurrent, moderate: Secondary | ICD-10-CM

## 2015-07-31 DIAGNOSIS — F411 Generalized anxiety disorder: Secondary | ICD-10-CM

## 2015-07-31 DIAGNOSIS — F4312 Post-traumatic stress disorder, chronic: Secondary | ICD-10-CM

## 2015-07-31 DIAGNOSIS — D7589 Other specified diseases of blood and blood-forming organs: Secondary | ICD-10-CM

## 2015-07-31 DIAGNOSIS — F4310 Post-traumatic stress disorder, unspecified: Secondary | ICD-10-CM | POA: Insufficient documentation

## 2015-07-31 DIAGNOSIS — G4700 Insomnia, unspecified: Secondary | ICD-10-CM

## 2015-07-31 DIAGNOSIS — K7010 Alcoholic hepatitis without ascites: Secondary | ICD-10-CM

## 2015-07-31 NOTE — Progress Notes (Signed)
   [facility] Behavioral Health Follow-up Outpatient Visit  Tricia [name] Oct 06, 1966  Date: 07/31/2015   Subjective: "I'm feeling good" HPI :Pt seen for relapse on alcohol during [name] prevention.Prior to this incident she took a Percocet to treat painful sunburn. In Treatment plannin it was revealed pt uses her [place] house for drinking with sexual encounters as a means of relieving her stress/altering her mood.She does not volunteer the sexual encounter today-admits to drinking over familiar family stressors/triggers.Recognizes her failure to to call her sponsor BEFORE .She called AFTER she had taken to drink..She is aware that she has 2 relapses and that she will need to be considered for discharge if she has a third (Her 1 time/1 pill  use of the pain medication for her sunburn though improper may not be a true slip) She reports no significant improvement/change in problems she drank over. SHE REPORTS THAT HER PHARMACY BENEFIT WITH [facility] C[employer] CAMPRAL AND SHE IS ASKED TO PAY $200.She reports her advisor is checking into this. There were no vitals filed for this visit. Review of Systems   Constitutional: Negative.    HENT:Negative.    Eyes: Negative.    Respiratory: Negative.    Cardiovascular:  Jan 15th had 3rd treatment for interstital cystitis   Musculoskeletal: Positive for hx of neck and back pain with MRI + for Disc disease Cspine 2015   Skin: Negative.    Neurological: Toes tingling per Dr Lianne Bushy note   Endo/Heme/Allergies: Negative.    Psychiatric/Behavioral: Positive for depression WITH MARKED IMPROVEMENT SINCE INCREASE IN PROZAC TO 40 MG DAILY , no suicidal ideas and substance abuse. The patient has insomniaRESPONSIVE TO TRAZODONE  Objective Musculoskeletal: Strength & Muscle Tone: within normal limits Gait & Station: normal Patient leans: N/A  Mental Status Examination  Appearance: Well groomed;smiling Alert: Yes Attention: good  Cooperative:  Yes Eye Contact: Good Speech: Clear and coherent Psychomotor Activity: Normal Memory/Concentration: Intact Oriented: person, place, time/date and situation Mood: Euthymic Affect: Congruent and Full Range Thought Processes and Associations: Coherent Fund of Knowledge: Fair Thought Content: NO Suicidal ideation, Homicidal ideation, Auditory hallucinations, Visual hallucinations, Paranoia.Persistent alcoholic delusions  Insight: Poor/ lacking Judgement: Impaired  Diagnosis: ICD-9-CM ICD-10-CM PL 1.  Acute alcoholic hepatitis   571.1 K70.10   2.  Major depressive disorder, recurrent episode, moderate   296.32 F33.1 3.  Macrocytosis without anemia   289.89 D75.89   4.  Insomnia   780.52 G47.00   5.  Generalized anxiety disorder   300.02 F41.1  6.  Alcohol dependence with other alcohol-induced disorder   303.90 ... F10.288  7.  Chronic post-traumatic stress disorder (PTSD)    Treatment Plan: Reviewed treatment protocol.Reviewed slip process pointing out alternative ways of thinking and behaving available to pt .Encouraged to utilize recovery tools.Advised she is wanted in program and wanted to succeed.No change in medications. Suggested she try [facility] to see what their cost would be in meantime and asked her to let me know outcome of her advisors inquiry.She has an appt next week to meet with Supervisor about coverage issue.  [name], PA-C

## 2015-07-31 NOTE — Telephone Encounter (Signed)
Left message on voice mail  to call back

## 2015-08-01 ENCOUNTER — Other Ambulatory Visit (HOSPITAL_COMMUNITY): Payer: Self-pay

## 2015-08-01 NOTE — Telephone Encounter (Signed)
Patient's mom Darel Hong) notified as instructed by telephone.

## 2015-08-02 ENCOUNTER — Other Ambulatory Visit (HOSPITAL_COMMUNITY): Payer: Self-pay | Admitting: Psychology

## 2015-08-02 DIAGNOSIS — F331 Major depressive disorder, recurrent, moderate: Secondary | ICD-10-CM

## 2015-08-02 DIAGNOSIS — F10288 Alcohol dependence with other alcohol-induced disorder: Secondary | ICD-10-CM

## 2015-08-02 DIAGNOSIS — F4312 Post-traumatic stress disorder, chronic: Secondary | ICD-10-CM

## 2015-08-03 ENCOUNTER — Other Ambulatory Visit (HOSPITAL_COMMUNITY): Payer: Self-pay | Admitting: Psychology

## 2015-08-03 DIAGNOSIS — F331 Major depressive disorder, recurrent, moderate: Secondary | ICD-10-CM

## 2015-08-03 DIAGNOSIS — F4310 Post-traumatic stress disorder, unspecified: Secondary | ICD-10-CM

## 2015-08-03 DIAGNOSIS — K7010 Alcoholic hepatitis without ascites: Secondary | ICD-10-CM

## 2015-08-03 DIAGNOSIS — F1020 Alcohol dependence, uncomplicated: Secondary | ICD-10-CM

## 2015-08-04 ENCOUNTER — Encounter (HOSPITAL_COMMUNITY): Payer: Self-pay | Admitting: Psychology

## 2015-08-04 ENCOUNTER — Other Ambulatory Visit (HOSPITAL_COMMUNITY): Payer: Self-pay

## 2015-08-04 LAB — 9+OXYCO+ALC-UNBUND, ORAL FLUID

## 2015-08-04 LAB — TOXASSURE SELECT 13 (MW), URINE: PDF: 0

## 2015-08-04 NOTE — Progress Notes (Signed)
    Daily Group Progress Note  Program: CD-IOP   Group Time: 1-2:30 pm  Participation Level: Active  Behavioral Response: Appropriate and Sharing  Type of Therapy: Process Group  Topic: Process: the first part of group was spent in process. Members shared about their successes and challenges in early recovery. Also disclosed were the things they had done to support and maintain their sobriety, including attending 12-step meetings or even practicing good self-care.  Group Time: 2:45- 4pm  Participation Level: Active  Behavioral Response: Sharing  Type of Therapy: Psycho-education Group  Topic: Psycho-Ed: Bio-Psycho-Social-Spiritual/Graduation. The second half of group was spent in a psycho-ed. A presentation was provided identifying the 4 elements that combined, lead to addiction. Near the end of the session, a ceremony was held honoring a member who was successfully graduating from the program today. His wife appeared as did a former group member who was there for support. The ceremony proved very touching and the graduating member was very emotional as he expressed his gratitude to his family, his fellow group members and this program.   Summary: The patient reported she had attended a meeting last night after the group session at the [employer]. She reported going there has become a very comfortable validating place for her and she really enjoys it. The patient reported her mother and step-father were going [place] this weekend and she would be at home by herself. She will look after their dog and sit with her grandfather at the nursing home for much of the weekend. I reminded her that there are great women's meetings over the weekend and encouraged her to attend the meetings held at the [employer]. In the psycho-ed, the patient was able to identify a number of ways that her psychological and social experiences promoted her drinking. She pointed out that there was a lot of  addiction on both sides of her family. The patient also explained that during her individual session, she recognizes that her 2 siblings also have addictions, but instead of drugs, they are work, sex and working out. "I am not the only one", she noted. During the graduation the patient was tearful as she expressed her gratitude towards the graduating member. She described how welcoming and friendly he had been when she first came to this program. The patient made some good comments and seems to have recommitted to her sobriety after her relapse 2 weeks ago. She responded well to this intervention and her sobriety date remains 9/9.   Family Program: Family present? No   Name of family member(s):   UDS collected: No Results:   AA/NA attended?: YesWednesday  Sponsor?: No   Kiosha Buchan, LCAS

## 2015-08-06 ENCOUNTER — Encounter (HOSPITAL_COMMUNITY): Payer: Self-pay | Admitting: Psychology

## 2015-08-06 NOTE — Addendum Note (Signed)
Addended byLogan Bores, ANN on: 08/06/2015 12:13 PM   Modules accepted: Medications

## 2015-08-06 NOTE — Progress Notes (Signed)
    Daily Group Progress Note  Program: CD-IOP   Group Time: 1-2:30 pm  Participation Level: Active  Behavioral Response: Sharing  Type of Therapy: Process Group  Topic:  The first part of group was spent in process where group members shared about obstacles and challenges related to their recovery. All group members were active and engaged in session. A drug test was administered to each member.  Patients also received the results of a previous drug test.  Counselor encouraged further insight into group member's issues through linking member's responses.  There was one new member who introduced herself to the group and was open about seeking help in her recovery. Members were very receptive of her and exchanged contact information to help support her.  Group Time: 2:45- 4pm  Participation Level: Active  Behavioral Response: Appropriate and Sharing  Type of Therapy: Psycho-education Group  Topic: Psycho-Ed: Counselor led group in a worksheet entitled "My Dangerous Situation". Clients wrote down their most triggering situations for their addictions. All group members were active and engaged in the writing and discussion process. Some members shared about specific instances of possible relapse situations and were able to process reasons why they were "Dangerous".    Summary: Patient reports that she attended 2 AA meetings since the last [place] and has established the [facility] as her home group. Pt says she is trying to stay busy during her weekends so that she is not tempted to drink. She attended the [facility] on Saturday and was excited to support her daughter who is bisexual. Pt was glad they did not serve alcohol at the festival however; she reports that she did experience a craving to drink. Pt has been growing her social support through [employer] attendance and meeting CDIOP group members outside the meetings when she is struggling. Pt met with program  director Darlyne Russian to discuss her relapse. She made some good comments and was able to "tell on herself" regarding her cravings. Her sobriety date remains 9/11   Family Program: Family present? No   Name of family member(s):   UDS collected: Yes Results: pending  AA/NA attended?: YesThursday and Saturday  Sponsor?: No, but she is seeking one   Haliey Romberg, LCAS

## 2015-08-07 ENCOUNTER — Other Ambulatory Visit (HOSPITAL_COMMUNITY): Payer: Self-pay | Admitting: Medical

## 2015-08-07 ENCOUNTER — Encounter (HOSPITAL_COMMUNITY): Payer: Self-pay | Admitting: Medical

## 2015-08-07 ENCOUNTER — Other Ambulatory Visit (HOSPITAL_COMMUNITY): Payer: Self-pay | Admitting: Psychology

## 2015-08-07 DIAGNOSIS — F4312 Post-traumatic stress disorder, chronic: Secondary | ICD-10-CM

## 2015-08-07 DIAGNOSIS — F331 Major depressive disorder, recurrent, moderate: Secondary | ICD-10-CM

## 2015-08-07 DIAGNOSIS — F10288 Alcohol dependence with other alcohol-induced disorder: Secondary | ICD-10-CM

## 2015-08-07 DIAGNOSIS — F1210 Cannabis abuse, uncomplicated: Secondary | ICD-10-CM

## 2015-08-07 DIAGNOSIS — F411 Generalized anxiety disorder: Secondary | ICD-10-CM

## 2015-08-07 NOTE — Progress Notes (Signed)
  [facility] Behavioral Health Follow-up Outpatient Visit  Tricia [name] November 30, 1965  Date: 08/07/2015   Subjective:Seen for UDS + for THC. Pt reports she is feeling well. When informed of UDS  Result she admits she went to [place] [facility] where they regularly smoke pot and are younger so "they dont understand" her need for abstinence. "I dont know why I thought I could go there and not smoke".  There were no vitals filed for this visit.  Review of Systems   Constitutional: Negative.    HENT:Negative.    Eyes: Negative.    Respiratory: Negative.    Cardiovascular:  Jan 15th had 3rd treatment for interstital cystitis   Musculoskeletal: Positive for hx of neck and back pain with MRI + for Disc disease Cspine 2015   Skin: Negative.    Neurological: Toes tingling per Dr Lianne Bushy note   Endo/Heme/Allergies: Negative.    Psychiatric/Behavioral: Positive for depression WITH MARKED IMPROVEMENT SINCE INCREASE IN PROZAC TO 40 MG DAILY , no suicidal ideas and substance abuse. The patient has insomniaRESPONSIVE TO TRAZODONE  Objective Musculoskeletal: Strength & Muscle Tone: within normal limits Gait & Station: normal Patient leans: N/A  Mental Status Examination   Appearance: Well groomed;smiling Alert: Yes Attention: good  Cooperative: Yes Eye Contact: Good Speech: Clear and coherent Psychomotor Activity: Normal Memory/Concentration: Intact Oriented: person, place, time/date and situation Mood: Euthymic Affect: Congruent and Full Range Thought Processes and Associations: Coherent Fund of Knowledge: Fair Thought Content: NO Suicidal ideation, Homicidal ideation, Auditory hallucinations, Visual hallucinations, Paranoia.Persistent alcoholic delusions   Insight: Poor/ lacking Judgement: Impaired  Diagnosis  ICD-9-CM ICD-10-CM PL 1.  Cannabis abuse, episodic use   305.22 F12.10   2.  Generalized anxiety disorder   300.02 F41.1   3.  Major depressive disorder, recurrent episode,  moderate   296.32 F33.1  4.  Chronic post-traumatic stress disorder (PTSD)   309.81 F43.12   5.  Alcohol dependence with other         Treatment Plan: Reviewed treatment protocol.Reviewed slip process and gave handout on lapse/slip vs relapse.Advised she is wanted in program and wanted to succeed. But this COULD BE OUR LAST MEETING>SHE NEEDS TO ABSTAIN FROM ALL MOOD ALTERING SUBSTANCES> No change in medications. Advised not to beat herself up over her illness.Just keep trying [facility]   [name], PA-C

## 2015-08-08 ENCOUNTER — Other Ambulatory Visit (HOSPITAL_COMMUNITY): Payer: Self-pay

## 2015-08-08 ENCOUNTER — Encounter (HOSPITAL_COMMUNITY): Payer: Self-pay | Admitting: Psychology

## 2015-08-08 NOTE — Addendum Note (Signed)
Addended byLogan Bores, ANN on: 08/08/2015 03:08 PM   Modules accepted: Medications

## 2015-08-08 NOTE — Progress Notes (Signed)
    Daily Group Progress Note  Program: CD-IOP   Group Time: 1-2:30 pm  Participation Level: Minimal  Behavioral Response: Appropriate  Type of Therapy: Psycho-education Group  Topic: Psycho-Ed: Guest; HIV/AIDS/Hep C/Safe Sex. The first half of group was spent in a psycho-ed. a guest speaker was present to address the group around issues of sexually transmitted diseases, how one contracts them and how one can avoid them. The discussion was frank, open and proved very informative to all present. Also in group today, were 2 PA students from [facility].  During group today the program director met with 2 group members to complete their discharge paperwork. They are both graduating this week.   Group Time: 2:45- 4pm  Participation Level: Active  Behavioral Response: Appropriate and Sharing  Type of Therapy: Process Group  Topic: Process/Graduation: The second half of group was spent in process. Members shared about the things they had done to support their recovery since the last session. A brief discussion on the variety of addictions, including 'process' addictions occurred. As the session came to an end, a graduation ceremony was held for a successfully graduating member. A guest and former group member arrived for the ceremony as did the graduating [employer]  and good friend. It was an emotional session with kind words of admiration and hope laced with tears.    Summary: The patient was attentive and engaged in the discussion with the health educator. She took some of the condoms and other items offered by the guest speaker. In process, the patient reported she had attended 2 AA meetings since our last group session. She noted that the topic for one of the meetings had been 'Resentments". The patient admitted this had been a good topic because she was feeling a lot of resentment and the discussion had helped her with them. She continues to visit with her grandfather in the  nursing home and help her mother with her step-father as he deteriorates with dementia. The importance of taking care of herself was reiterated, but the patient has admitted that she doesn't really know how to do this. During the graduation, the patient grew teary as she recounted how welcoming and supportive the graduating member had been to her when she first arrived. They have also gone to many AA meetings together and she expressed her appreciation for her kindness. The patient responded well to this intervention and her sobriety date remains 9/11.   Family Program: Family present? No   Name of family member(s):   UDS collected: No Results:   AA/NA attended?: [place] and Tuesday  Sponsor?: No   EVANS,ANN, LCAS

## 2015-08-08 NOTE — Progress Notes (Signed)
    Daily Group Progress Note  Program: CD-IOP   Group Time: 1-2:30 pm  Participation Level: Minimal  Behavioral Response: Appropriate  Type of Therapy: Process Group  Topic: Members met for first half of group time for a group process. A drug test was administered at the beginning of group. One new member joined the group today and shared briefly about what led him to seek treatment for his alcoholism. Members shared about their struggles and challenges related to recovery and AA meeting attendance. One member, Lelan Pons was not present. She called earlier in the day to inform the counselor that her flight from [place] "didn't work out". Drug test results from last week were handed out and discussion followed. Some members tested positive for THC and/or benzos and the counselor led a discussion concerning honesty and sobriety.   Group Time: 2:45- 4pm  Participation Level: Minimal  Behavioral Response: quiet and unresponsive  Type of Therapy: Psycho-education Group  Topic: Members met for the 2nd half of group time and completed a group psycho-ed on an "abstinence checklist" and the neurobiology of addiction. First, the counselor led a discussion on the abstinence checklist. Members recounted the things in their life that could devolve if they used alcohol or drugs again. Additionally, the counselor played a video called "The Biology of Addiction" which explained how the brain becomes chemically dependent. Group members processed the video afterward.  Summary: Patient was flat but attentive in session. She appeared uncomfortable and tense due to her drug test results from the previous week. She tested positive for a small amount of THC. When confronted, she reported to the group that she had visited her cousin's house during that weekend and that they smoked weed. She claimed she did not smoke but most have "gotten a little bit in her system" from being in the same room. The counselor  explained that this was highly unlikely. The patient spent last weekend helping care for her grandfather who is in a nursing home. Patient reported attending 1 AA meeting. She also reported that she is struggling with depression and having difficulty dealing with her emotions. There is some uncertainty about her new sobriety date. Patient's new sobriety date appears to be 9/18.  Family Program: Family present? No   Name of family member(s):   UDS collected: Yes Results: pending  AA/NA attended?: YesFriday  Sponsor?: No   EVANS,ANN, LCAS

## 2015-08-09 ENCOUNTER — Other Ambulatory Visit (HOSPITAL_COMMUNITY): Payer: Self-pay | Admitting: Psychology

## 2015-08-09 DIAGNOSIS — K7010 Alcoholic hepatitis without ascites: Secondary | ICD-10-CM

## 2015-08-09 DIAGNOSIS — F331 Major depressive disorder, recurrent, moderate: Secondary | ICD-10-CM

## 2015-08-09 DIAGNOSIS — F1020 Alcohol dependence, uncomplicated: Secondary | ICD-10-CM

## 2015-08-09 NOTE — Progress Notes (Signed)
Tricia [name] is a 49 y.o. female patient. CD-IOP: Treatment Plan Update. I met with the patient this afternoon for her weekly individual counseling session and treatment plan update. She reported she was really stressed out because so much is going on with her family. She explained that her grandfather is not getting good care in the nursing home and his physician has scheduled a meeting with the staff at the facility. She is also looking after her step-father who suffers from dementia and cannot be alone in the home. I agreed that there seems to be a lot going on right now, but it is important that she take some time for herself and her own health and recovery. I explained the need to review her goals of treatment. Regarding ongoing sobriety, the patient's drug test collected on the 19th was positive for THC. She denied using when the results were given to her, but admitted she had spent time with her cousin and daughter that weekend and the two of them had been smoking pot heavily. She continued to maintain that she had not smoked pot, but must have inhaled it while she was around them and that was why she had tested positive. Her second goal is to build support for her recovery and she is attending [place] meetings as required in the program. We discussed how she might go about asking a woman she has met at the [facility] [facility] to be her sponsor. She was going to a meeting after this session and agreed to speak with her if she was at the meeting. The patient's 3rd treatment goal is to regain the trust of her family. When asked how this was going, the patient reported she has been making good progress and this is evidenced by having more freedom and responsibilities. Her 4th goal addressed problems with anger and resentments and we discussed how she might begin to address the resentments she has towards her father. I explained some things we could do in group or some other ideas that she would be able to address in  individual session. The patient was very receptive towards these suggestions and agreed that she would consider my ideas. The treatment plan update was reviewed and signed. I reminded the patient that she has had 2 relapses and it is essential that she really focus on her recovery and avoid any high risk situations. If she uses again, the patient will be discharged from the program unsuccessful. We will continue to follow closely in the days ahead. Her new sobriety date is tentatively 9/19.         EVANS,ANN, LCAS

## 2015-08-10 ENCOUNTER — Other Ambulatory Visit (HOSPITAL_COMMUNITY): Payer: Self-pay | Admitting: Psychology

## 2015-08-11 ENCOUNTER — Other Ambulatory Visit (HOSPITAL_COMMUNITY): Payer: Self-pay

## 2015-08-14 ENCOUNTER — Encounter: Payer: Self-pay | Admitting: Medical

## 2015-08-14 ENCOUNTER — Encounter (HOSPITAL_COMMUNITY): Payer: Self-pay | Admitting: Psychology

## 2015-08-14 ENCOUNTER — Other Ambulatory Visit (HOSPITAL_COMMUNITY): Payer: Self-pay | Attending: Medical

## 2015-08-14 NOTE — Progress Notes (Signed)
    Daily Group Progress Note  Program: CD-IOP   Group Time: 1-2:30 pm  Participation Level: Active  Behavioral Response: Appropriate and Sharing  Type of Therapy: Process Group  Topic: Process/Family Roles: the first half of group was spent in process followed by a psycho-ed. Members checked in and shared about what they had done to support their recovery since the last group session. Upon completion, a handout was provided about Family Roles. Members read through the handout and identified what roles they or other family members took on in their childhoods. The session proved very informative with members disclosing intimate details about their memories of their early lives.   Group Time: 2:45 -4pm  Participation Level: Active  Behavioral Response: Sharing  Type of Therapy: Psycho-education Group  Topic: Psycho-ed: Pros and Cons: the second half for group was spent in a psycho-ed with members identifying the good things and bad things about active addiction versus sobriety. Members were open about the things they did to hide their addiction, but also the challenges of early recovery. The session proved very revealing with all group members sharing about the struggles and glories of addiction and recovery.   Summary: The patient reported that she had attended the 5:30 AA meeting at the [employer] yesterday afternoon. It had been a very powerful and important meeting with the topic "Gratitude". The patient also reported she had asked the woman she had hoped to seek as a sponsor. The potential sponsor explained she couldn't sponsor her due to responsibilities, but agreed to make some recommendations for someone that would guide her. In identifying family roles, the patient reported she seemed to always be the "scapegoat". His brother never got in trouble while her sister, who was the oldest, was the "hero". In the second half of group, the patient identified "enhanced sexual performance" as  a pro of active addiction. Other members agreed that drinking lowered their inhibitions and they did or said things they wouldn't normally do. The patient did identify 'repairing relationships' as a pro of sobriety. When asked to explain this, the patient noted that she was regaining trust with her parents and siblings and it feels good. As the session came to an end, the patient reported she was going to be completing emptying her townhouse and she was not going to be alone. She admitted it was likely they would discover empty vodka bottles, but regardless, there will be family or friends helping clean it out and she didn't anticipate any temptations or challenges to her sobriety. The patient responded well to this intervention and her sobriety date is 9/18.   Family Program: Family present? No   Name of family member(s):   UDS collected: No Results:   AA/NA attended?: YesWednesday  Sponsor?: No, but she asked a woman to be her sponsor   Marea Reasner, LCAS
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
## 2015-08-15 ENCOUNTER — Other Ambulatory Visit (HOSPITAL_COMMUNITY): Payer: Self-pay

## 2015-08-15 ENCOUNTER — Telehealth (HOSPITAL_COMMUNITY): Payer: Self-pay | Admitting: Psychology

## 2015-08-15 LAB — TOXASSURE SELECT,+ANTIDEPR,UR: PDF: 0

## 2015-08-15 NOTE — Progress Notes (Signed)
Tricia [name] is a 49 y.o. female patient. CD-IOP: The patient did not appear in group today nor did she phone to explain her absence. A fellow group member stated that she had received a text from this member asking that she inform the counselor that she was sick. She told her she didn't have the phone number. A phone call and message was left for her after the group session. The patient is scheduled to appear tomorrow for her weekly individual session with this counselor. We will wait to hear from her. The absence is unexcused.         Juleah Paradise, LCAS

## 2015-08-16 ENCOUNTER — Encounter (HOSPITAL_COMMUNITY): Payer: Self-pay | Admitting: Psychology

## 2015-08-16 ENCOUNTER — Other Ambulatory Visit (HOSPITAL_COMMUNITY): Payer: Self-pay

## 2015-08-17 ENCOUNTER — Other Ambulatory Visit (HOSPITAL_COMMUNITY): Payer: Self-pay

## 2015-08-18 ENCOUNTER — Other Ambulatory Visit (HOSPITAL_COMMUNITY): Payer: Self-pay

## 2015-08-18 NOTE — Progress Notes (Signed)
Tricia [name] is a 49 y.o. female patient. CD-IOP: Unsuccessful Discharge. The patient has relapsed 3 times during her enrollment in the CD-IOP. She is encouraged to seek entry into residential treatment and possibly consider return to this program upon successful completion of a residential stay.         Tahni Porchia, LCAS

## 2015-08-21 ENCOUNTER — Other Ambulatory Visit (HOSPITAL_BASED_OUTPATIENT_CLINIC_OR_DEPARTMENT_OTHER): Payer: Self-pay | Admitting: Medical

## 2015-08-21 ENCOUNTER — Encounter (HOSPITAL_COMMUNITY): Payer: Self-pay | Admitting: Medical

## 2015-08-21 DIAGNOSIS — Z9111 Patient's noncompliance with dietary regimen: Secondary | ICD-10-CM

## 2015-08-21 DIAGNOSIS — F331 Major depressive disorder, recurrent, moderate: Secondary | ICD-10-CM

## 2015-08-21 DIAGNOSIS — F529 Unspecified sexual dysfunction not due to a substance or known physiological condition: Secondary | ICD-10-CM

## 2015-08-21 DIAGNOSIS — K7010 Alcoholic hepatitis without ascites: Secondary | ICD-10-CM

## 2015-08-21 DIAGNOSIS — Z91199 Patient's noncompliance with other medical treatment and regimen due to unspecified reason: Secondary | ICD-10-CM | POA: Insufficient documentation

## 2015-08-21 DIAGNOSIS — F4310 Post-traumatic stress disorder, unspecified: Secondary | ICD-10-CM

## 2015-08-21 DIAGNOSIS — IMO0001 Reserved for inherently not codable concepts without codable children: Secondary | ICD-10-CM | POA: Insufficient documentation

## 2015-08-21 DIAGNOSIS — F1994 Other psychoactive substance use, unspecified with psychoactive substance-induced mood disorder: Secondary | ICD-10-CM | POA: Insufficient documentation

## 2015-08-21 DIAGNOSIS — F1020 Alcohol dependence, uncomplicated: Secondary | ICD-10-CM

## 2015-08-21 DIAGNOSIS — F1210 Cannabis abuse, uncomplicated: Secondary | ICD-10-CM | POA: Insufficient documentation

## 2015-08-21 NOTE — Progress Notes (Signed)
  [facility] Chemical Dependency Intensive Outpatient Discharge Summary   [name] [id number]  Date of Admission: 07/03/2015 Date of Discharge: 08/18/2015  Course of Treatment:  Charmian Muff, LCAS Signed Charmian Muff, LCAS 08/18/2015  9:31 AM       **Sensitive Note**     Progress Notes        Tricia [name] is a 49 y.o. female patient. CD-IOP: Unsuccessful Discharge. The patient has relapsed 3 times during her enrollment in the CD-IOP. She is encouraged to seek entry into residential treatment and possibly consider return to this program upon successful completion of a residential stay.        Despite intervention on her first 2 occasions of use and medication management for her PTSD  and its associated anxious depression pt would return to trigger situations on weekends rather than follow suggestions to avoid slips.This failure to change her behavior suggests she needs a higher level of care to give her addicted brain enough abstinent time (30 days) to begin to function more normally.  Goals and Activities to Help Maintain Sobriety: 1. Stay away from old friends who continue to drink and use mind-altering chemicals. 2. Continue practicing Fair Fighting rules in interpersonal conflicts. 3. Continue alcohol and drug refusal skills and call on support systems. 4. SEEK HIGHER LEVEL OF CARE  Referrals: General to higher level of care    Aftercare services: NA 1. Attend AA/NA meetings at least daily  2. Obtain a sponsor and a home group in AA/NA. 3. Seek inpt treatment  Next appointment: NA  Plan of Action to Address Continuing Problems: Counselor discussed recommendation for inpt therapy with Mother who has been pts support   Client has NOT participated in the development of this discharge plan and has NOT received a copy of this completed plan  [name]  08/21/2015   [name], PA-C 08/21/2015

## 2015-08-22 ENCOUNTER — Telehealth: Payer: Self-pay | Admitting: Family Medicine

## 2015-08-22 NOTE — Telephone Encounter (Signed)
Left message for patient to return call.

## 2015-08-22 NOTE — Telephone Encounter (Signed)
Please call pt.  The patient was discharged from outpatient program. She is encouraged to seek entry into residential treatment for substance.  If she needs referral for inpatient program, then let me know. Thanks.

## 2015-08-24 ENCOUNTER — Other Ambulatory Visit (HOSPITAL_COMMUNITY): Payer: Self-pay | Admitting: Medical

## 2015-08-25 NOTE — Telephone Encounter (Signed)
Left message on voice mail  to call back

## 2015-08-28 NOTE — Telephone Encounter (Signed)
Several messages have been left for patient and patient has not called back.

## 2015-08-29 NOTE — Telephone Encounter (Signed)
Noted. Thanks.

## 2015-09-12 ENCOUNTER — Other Ambulatory Visit (HOSPITAL_COMMUNITY): Payer: Self-pay | Admitting: Medical

## 2015-09-13 ENCOUNTER — Telehealth: Payer: Self-pay

## 2015-09-13 NOTE — Telephone Encounter (Signed)
Pt left v/m; pt is presently at treatment center at [facility]; pt requesting new rx for trazodone that is stronger than 150 mg; 150 mg is not helping. Pt request rx sent to [address]. Can contact Elyn Aquas. Frazier a counselor at [phone]- [phone]. Pt said would need to contact Mr Bascom LevelsFrazier because pt cannot receive call.Please advise. Pt last f/u appt 06/21/15.

## 2015-09-13 NOTE — Telephone Encounter (Signed)
If she is in treatment facility, I need details before we do anything.  Please call and get update from Mr. Bascom LevelsFrazier.  I need verification of the treatment center location, her current med list and treatment plan, her current attending MD in the facility (if there is one).  I'm can't even consider changing a med for her until I have details about her current situation.  Thanks.

## 2015-09-13 NOTE — Telephone Encounter (Signed)
Left message on voicemail for Tricia [name] to call back. 

## 2015-09-14 ENCOUNTER — Other Ambulatory Visit (HOSPITAL_COMMUNITY): Payer: Self-pay | Admitting: Medical

## 2015-09-14 ENCOUNTER — Telehealth: Payer: Self-pay | Admitting: Family Medicine

## 2015-09-14 NOTE — Telephone Encounter (Signed)
Pt called back requesting refill of meds I advise pt  that we have to have pt's plan of care/ med list, Dr. Para Marchuncan direct fax # was given to pt and she will have the facility fax us over these notes

## 2015-09-14 NOTE — Telephone Encounter (Signed)
Patient is in Rehab at [facility].  Patient has to have her [facility] [address]. Please call Darel HongJudy when prescription is called in to pharmacy.

## 2015-09-14 NOTE — Telephone Encounter (Signed)
I'm not changing any of her meds w/o an updated plan on patient.  See prev phone note re: Bascom LevelsFrazier. Thanks.

## 2015-09-14 NOTE — Telephone Encounter (Signed)
Noted. Thanks.

## 2015-09-14 NOTE — Telephone Encounter (Signed)
Pt called back requesting refill of meds I advise pt  that we have to have pt's plan of care/ med list, Dr. Duncan direct fax # was given to pt and she will have the facility fax us over these notes 

## 2015-09-14 NOTE — Telephone Encounter (Signed)
See previous phone note. Have left message for Mr. Bascom LevelsFrazier to call back regarding patient. Don't see where patient's mom is on DPR.

## 2015-09-14 NOTE — Telephone Encounter (Signed)
Left message on voicemail for Mr. Tricia [name] to call back.

## 2015-09-14 NOTE — Telephone Encounter (Signed)
Patient's mom notified as instructed by telephone and verbalized understanding. Mrs. Twine stated that she will contact the facility where the patient is and have them call Dr. Para Marchuncan.

## 2015-09-22 ENCOUNTER — Telehealth: Payer: Self-pay | Admitting: Family Medicine

## 2015-09-22 ENCOUNTER — Telehealth (HOSPITAL_COMMUNITY): Payer: Self-pay | Admitting: Licensed Clinical Social Worker

## 2015-09-22 DIAGNOSIS — F1010 Alcohol abuse, uncomplicated: Secondary | ICD-10-CM

## 2015-09-22 NOTE — Telephone Encounter (Signed)
Pt called. She would like to be referred back to [facility] (intensive outpatient treatment) for her alcohol abuse. She called them to start back up but needs another referral. She has the orange card as insurance. Please advise.  Pt callback # [phone]

## 2015-09-22 NOTE — Telephone Encounter (Signed)
Ordered. Thanks

## 2015-09-25 NOTE — Telephone Encounter (Signed)
Noted. Thanks.

## 2015-09-25 NOTE — Telephone Encounter (Signed)
Called patient to help get appt with Out patient behavioural health and she said that she is going to set this up herself and doesn't need our help. She made an appt with you for tomorrow b/c she thinks she cracked her ribs again, she will discuss more with you then.

## 2015-09-26 ENCOUNTER — Ambulatory Visit (INDEPENDENT_AMBULATORY_CARE_PROVIDER_SITE_OTHER): Payer: No Typology Code available for payment source | Admitting: Family Medicine

## 2015-09-26 ENCOUNTER — Ambulatory Visit (INDEPENDENT_AMBULATORY_CARE_PROVIDER_SITE_OTHER)
Admission: RE | Admit: 2015-09-26 | Discharge: 2015-09-26 | Disposition: A | Discharge status: Home / Self Care | Payer: No Typology Code available for payment source | Source: Ambulatory Visit | Attending: Family Medicine | Admitting: Family Medicine

## 2015-09-26 ENCOUNTER — Encounter: Payer: Self-pay | Admitting: Family Medicine

## 2015-09-26 VITALS — BP 118/60 | HR 94 | Temp 97.7°F | Wt 184.0 lb

## 2015-09-26 DIAGNOSIS — S2241XA Multiple fractures of ribs, right side, initial encounter for closed fracture: Secondary | ICD-10-CM

## 2015-09-26 DIAGNOSIS — F329 Major depressive disorder, single episode, unspecified: Secondary | ICD-10-CM

## 2015-09-26 DIAGNOSIS — F32A Depression, unspecified: Secondary | ICD-10-CM

## 2015-09-26 DIAGNOSIS — R0789 Other chest pain: Secondary | ICD-10-CM

## 2015-09-26 DIAGNOSIS — F339 Major depressive disorder, recurrent, unspecified: Secondary | ICD-10-CM

## 2015-09-26 MED ORDER — FLUOXETINE HCL 40 MG PO CAPS: 40.0000 mg | ORAL_CAPSULE | Freq: Every day | ORAL | Status: DC

## 2015-09-26 MED ORDER — TRAZODONE HCL 150 MG PO TABS: 150.0000 mg | ORAL_TABLET | Freq: Every evening | ORAL | Status: DC | PRN

## 2015-09-26 MED ORDER — METOPROLOL TARTRATE 25 MG PO TABS: 25.0000 mg | ORAL_TABLET | Freq: Two times a day (BID) | ORAL | Status: DC

## 2015-09-26 MED ORDER — ACYCLOVIR 400 MG PO TABS: 400.0000 mg | ORAL_TABLET | Freq: Two times a day (BID) | ORAL | Status: DC | PRN

## 2015-09-26 MED ORDER — TRAMADOL HCL 50 MG PO TABS: 50.0000 mg | ORAL_TABLET | Freq: Three times a day (TID) | ORAL | Status: DC | PRN

## 2015-09-26 NOTE — Patient Instructions (Addendum)
[name] will call about your referral.  Tell her you have the orange card.   We'll contact you with your xray report.  Don't change your regular meds for now.  Take tramadol as needed for pain, sedation caution.  Update me in a few days.   Take care. Glad to see you.

## 2015-09-26 NOTE — Progress Notes (Signed)
[employer] grad, 34 days sober now.  Not on acamprosate yet.  She couldn't afford it.   Encour[facility].  She is still going to AA, has gone 34 days in a row.   Hasn't seen psych in the meantime as an outpatient.   No SI/HI.  Still tearful and trouble sleeping.  Taking prozac 40mg  day.   Taking trazodone 200mg  qhs w/o help for sleep.  Trouble getting to and then staying asleep, even with med use.     Was out playing volleyball recently and hit R side of lower chest, with pain at the time.  Continued pain in the meantime.  Taking advil 800mg  tid.  Not improved in the meantime.  No L sided pain.  No bruising.  Pain with a deep breath.  Pain lifting with R arm and pain leaning to R side.  No fevers.    Meds, vitals, and allergies reviewed.   ROS: See HPI.  Otherwise, noncontributory.  GEN: nad, alert and oriented, speech steady and normal.   Judgement intact HEENT: mucous membranes moist NECK: supple w/o LA CV: rrr. PULM: ctab, no inc wob, R chest Tolle ttp w/o bruising.  ABD: soft, +bs EXT: no edema SKIN: no acute rash

## 2015-09-27 ENCOUNTER — Other Ambulatory Visit: Payer: Self-pay

## 2015-09-27 DIAGNOSIS — S2249XA Multiple fractures of ribs, unspecified side, initial encounter for closed fracture: Secondary | ICD-10-CM | POA: Insufficient documentation

## 2015-09-27 NOTE — Assessment & Plan Note (Signed)
No SI/HI, still okay for outpatient f/u.  Likely shouldn't start tramadol and inc SSRI concurrently.  I did fill her current meds, encouraged AA and sobriety, she has family support.  Refer to psych.  She agrees with plan. >25 minutes spent in face to face time with patient, >50% spent in counselling or coordination of care.

## 2015-09-27 NOTE — Assessment & Plan Note (Signed)
Imaging reviewed, then reviewed again.  fx noted.  See notes on films.   Cautioned patient re: opiates with ongoing sobriety.  At this point, needs pain meds.   Start tramadol with cautions.  Will get f/u update from patient.   Okay for outpatient f/u at this point.

## 2015-10-03 ENCOUNTER — Telehealth: Payer: Self-pay | Admitting: Family Medicine

## 2015-10-03 NOTE — Telephone Encounter (Signed)
Ms. Tricia [name] returned your call from yesterday.  Pt requests c/b.  Thank you.

## 2015-10-03 NOTE — Telephone Encounter (Signed)
Phoned patient.  See Result note.

## 2015-10-04 ENCOUNTER — Encounter: Payer: Self-pay | Admitting: Internal Medicine

## 2015-10-04 ENCOUNTER — Telehealth: Payer: Self-pay

## 2015-10-04 ENCOUNTER — Ambulatory Visit (INDEPENDENT_AMBULATORY_CARE_PROVIDER_SITE_OTHER): Payer: No Typology Code available for payment source | Admitting: Internal Medicine

## 2015-10-04 VITALS — BP 114/82 | HR 77 | Temp 97.9°F | Wt 182.0 lb

## 2015-10-04 DIAGNOSIS — S2241XS Multiple fractures of ribs, right side, sequela: Secondary | ICD-10-CM

## 2015-10-04 DIAGNOSIS — J069 Acute upper respiratory infection, unspecified: Secondary | ICD-10-CM

## 2015-10-04 MED ORDER — HYDROCODONE-ACETAMINOPHEN 5-325 MG PO TABS: 1.0000 | ORAL_TABLET | Freq: Four times a day (QID) | ORAL | Status: DC | PRN

## 2015-10-04 MED ORDER — AZITHROMYCIN 250 MG PO TABS: ORAL_TABLET | ORAL | Status: DC

## 2015-10-04 NOTE — Progress Notes (Signed)
HPI  Pt presents to the clinic today with c/o cough and chest congestion. This started 3 weeks ago. The cough is mostly nonproductive. She has had an associated sore throat. She has had some chest tightness but denies chest pain or shortness of breath. She denies fever, chills or body aches. She does have some right side rib fractures (seen 09/26/15 by Dr. Para Marchuncan- she reports that Tramadol is not effective). She has tried Vicks, Nyquil, Advil cold and sinus and Tussionex without any relief. She has no history of allergies or breathing problems. She has not had sick contacts that she is aware of.  Review of Systems      Past Medical History  Diagnosis Date  . Anxiety     Panic  . Interstitial cystitis     per [facility]  . Cardiomyopathy     EF 45% by myoview, 45-50% by echo, 50% by MRI. ? PVC-associated cardiomyopathy  . Chest pain     ETT myoview 4/11) 6'15", frequent PVCs and run NSVT, mild global HK with EF 45%, fixed anterior defect was likely soft tissue attenuation so no evidence for ischemia or infarction.  . Abnormal MRI, cervical spine 08/2010    with uncovertebral disease on the left at C6-7 encroaching on the left C7 root.  . Toe fracture, right 11/04/11    2,3rd toes  . Insomnia   . Premature ventricular contractions     resolved with metoprolol  . Depression     suicidal thoughts 2013, with [facility] eval  . Angioedema 12/2012    ? food allergy - ED eval  . Irritable bowel syndrome with diarrhea   . Arrhythmia     Family History  Problem Relation Age of Onset  . Heart disease Maternal Grandfather 963    CABG  . Heart disease Cousin     Aortic dissection  . Alcohol abuse Cousin   . Alcohol abuse Maternal Uncle   . Alcohol abuse Paternal Uncle   . Alcohol abuse Paternal Grandfather     Social History   Social History  . Marital Status: Legally Separated    Spouse Name: N/A  . Number of Children: 1  . Years of Education: N/A   Occupational History  .  Registrar at [employer]    Social History Main Topics  . Smoking status: Never Smoker   . Smokeless tobacco: Never Used  . Alcohol Use: 21.0 oz/week    15 Glasses of wine, 20 Shots of liquor per week     Comment: daily  . Drug Use: No  . Sexual Activity: Yes    Birth Control/ Protection: Condom   Other Topics Concern  . Not on file   Social History Narrative   Divorced, one daughter    [place] in [place]   She is a Sales promotion account executivestudent registrar at [employer]   She has been a regular user of alcohol    Allergies  Allergen Reactions  . Bentyl [Dicyclomine Hcl]     tingling  . Clarithromycin     REACTION: nausea and vomiting     Constitutional: Denies headache, fatigue, fever or abrupt weight changes.  HEENT:  Positive sore throat. Denies eye redness, eye pain, pressure behind the eyes, facial pain, nasal congestion, ear pain, ringing in the ears, wax buildup, runny nose or bloody nose. Respiratory: Positive cough. Denies difficulty breathing or shortness of breath.  Cardiovascular: Denies chest pain, chest tightness, palpitations or swelling in the hands or feet.  MSK: Pt  reports right side rib pain.  No other specific complaints in a complete review of systems (except as listed in HPI above).  Objective:   BP 114/82 mmHg  Pulse 77  Temp(Src) 97.9 F (36.6 C) (Oral)  Wt 182 lb (82.555 kg)  SpO2 98%  Wt Readings from Last 3 Encounters:  10/04/15 182 lb (82.555 kg)  09/26/15 184 lb (83.462 kg)  07/03/15 186 lb 4 oz (84.482 kg)     General: Appears her stated age, in NAD. HEENT: Head: normal shape and size, no sinus tenderness; Eyes: sclera white, no icterus, conjunctiva pink; Ears: Tm's gray and intact, normal light reflex; Nose: mucosa pink and moist, septum midline; Throat/Mouth: + PND. Teeth present, mucosa erythematous and moist, no exudate noted, no lesions or ulcerations noted.  Neck: No cervical lymphadenopathy.  Cardiovascular: Normal rate and rhythm. S1,S2  noted.  No murmur, rubs or gallops noted.  Pulmonary/Chest: Normal effort and positive vesicular breath sounds. No respiratory distress. No wheezes, rales or ronchi noted.  MSK: Right side rib pain reproduced with palpation.    Assessment & Plan:   Upper Respiratory Infection:  Get some rest and drink plenty of water Do salt water gargles for the sore throat Given duration of symptoms in combination with recent rib fractures, I want to keep her from developing a pneumonia, eRx for Azithromax x 5 days Advised her to take Delsym as needed for cough  Right side rib fractures:  Tramadol not effective Will discuss with Dr. Para March to see if he has any other recommendations  RTC as needed or if symptoms persist.

## 2015-10-04 NOTE — Addendum Note (Signed)
Addended by: Lorre MunroeBAITY, REGINA W on: 10/04/2015 03:34 PM   Modules accepted: Orders

## 2015-10-04 NOTE — Patient Instructions (Signed)

## 2015-10-04 NOTE — Telephone Encounter (Signed)
[facility]---pt to come and pick up Rx for Norco---I have called pt 4 times from 4:00pm -5:10pm---no answer lmovm

## 2015-10-04 NOTE — Progress Notes (Signed)
Pre visit review using our clinic review tool, if applicable. No additional management support is needed unless otherwise documented below in the visit note. 

## 2015-10-16 ENCOUNTER — Telehealth: Payer: Self-pay | Admitting: Family Medicine

## 2015-10-16 MED ORDER — AMOXICILLIN-POT CLAVULANATE 875-125 MG PO TABS: 1.0000 | ORAL_TABLET | Freq: Two times a day (BID) | ORAL | Status: DC

## 2015-10-16 NOTE — Telephone Encounter (Signed)
Patient notified as instructed by telephone and verbalized understanding. 

## 2015-10-16 NOTE — Telephone Encounter (Signed)
I sent augmentin, f/u if sx not improved.  Thanks.

## 2015-10-16 NOTE — Telephone Encounter (Signed)
Pt called stating she took all of her zpack.  She stated she is not any better  Coughing  She wanted to know if you would call her in something else in or does she need to come back in [facility] [address]

## 2015-11-01 ENCOUNTER — Encounter: Payer: Self-pay | Admitting: Family Medicine

## 2015-11-01 ENCOUNTER — Ambulatory Visit (INDEPENDENT_AMBULATORY_CARE_PROVIDER_SITE_OTHER): Payer: No Typology Code available for payment source | Admitting: Family Medicine

## 2015-11-01 VITALS — BP 126/84 | HR 80 | Temp 98.4°F | Wt 192.0 lb

## 2015-11-01 DIAGNOSIS — R05 Cough: Secondary | ICD-10-CM

## 2015-11-01 DIAGNOSIS — R059 Cough, unspecified: Secondary | ICD-10-CM

## 2015-11-01 MED ORDER — BENZONATATE 200 MG PO CAPS: 200.0000 mg | ORAL_CAPSULE | Freq: Three times a day (TID) | ORAL | Status: DC | PRN

## 2015-11-01 MED ORDER — VALACYCLOVIR HCL 500 MG PO TABS: 500.0000 mg | ORAL_TABLET | Freq: Every day | ORAL | Status: DC | PRN

## 2015-11-01 MED ORDER — DOXYCYCLINE HYCLATE 100 MG PO TABS
100.0000 mg | ORAL_TABLET | Freq: Two times a day (BID) | ORAL | Status: DC
Start: 2015-11-01 — End: 2016-01-30

## 2015-11-01 NOTE — Patient Instructions (Signed)
Start doxy.   Take tessalon for the cough.  You should be able to tolerate that.  Take care.  Glad to see you.

## 2015-11-01 NOTE — Progress Notes (Signed)
Pre visit review using our clinic review tool, if applicable. No additional management support is needed unless otherwise documented below in the visit note.  She is sober and living near family.  She isn't drinking.    She needed to change to valtex since acyclovir was too expensive.    Cough continues.  She can't sleep.  She has sputum, coughing to the point of vomiting.  No fevers.  Fatigued.  Sweaty last night.  Some wheeze, recently.  Voice is altered.  Trying to rest her voice. She got slightly better with zmax, but then didn't get well.  Couldn't afford augmentin.    Meds, vitals, and allergies reviewed.   ROS: See HPI.  Otherwise, noncontributory.  GEN: nad, alert and oriented HEENT: mucous membranes moist, tm w/o erythema, nasal exam w/o erythema, clear discharge noted, stuffy nose, OP with cobblestoning NECK: supple w/o LA CV: rrr.   PULM: coarse BS but o/w ctab, no inc wob, chest [place] not ttp EXT: no edema

## 2015-11-02 ENCOUNTER — Telehealth: Payer: Self-pay

## 2015-11-02 DIAGNOSIS — R059 Cough, unspecified: Secondary | ICD-10-CM | POA: Insufficient documentation

## 2015-11-02 DIAGNOSIS — R05 Cough: Secondary | ICD-10-CM | POA: Insufficient documentation

## 2015-11-02 MED ORDER — AMOXICILLIN 500 MG PO CAPS: 1000.0000 mg | ORAL_CAPSULE | Freq: Two times a day (BID) | ORAL | Status: DC

## 2015-11-02 NOTE — Telephone Encounter (Signed)
Carollee HerterShannon notified new prescription for amoxicillin has been sent to her pharmacy.

## 2015-11-02 NOTE — Telephone Encounter (Signed)
Call in amoxicillin 500 mg 2tab po BID x 10 day, #40, 0RF

## 2015-11-02 NOTE — Assessment & Plan Note (Signed)
Presumed bronchitis.   Nontoxic.  Okay for outpatient f/u.  Start doxy.  Take tessalon for the cough. She should be able to tolerate that- d/w pt about intolerance hx  Update us as needed.  She agrees.

## 2015-11-02 NOTE — Telephone Encounter (Signed)
Pt left v/m; pt seen 11/01/15 and abx was too expensive and pt request different abx. Pt request [facility] [address].

## 2015-12-07 ENCOUNTER — Telehealth: Payer: Self-pay | Admitting: Family Medicine

## 2015-12-07 DIAGNOSIS — Z0100 Encounter for examination of eyes and vision without abnormal findings: Secondary | ICD-10-CM

## 2015-12-07 NOTE — Telephone Encounter (Signed)
Received a message from patient wanting to be referred to an Opthamologist for an eye exam. She has the [facility] card and request that we fax her referral request to the [place] card people. Please place referral.

## 2015-12-07 NOTE — Telephone Encounter (Signed)
Ordered. Thanks

## 2015-12-08 NOTE — Telephone Encounter (Signed)
Referral faxed to [facility] card, patient aware.

## 2015-12-19 ENCOUNTER — Telehealth: Payer: Self-pay

## 2015-12-19 NOTE — Telephone Encounter (Signed)
Pt request refill fluoxetine; spoke with Gwenlyn Found at [facility] [address] and pt has refills available for fluoxetine # 30 x 2. Pt voiced understanding and will contact pharmacy for refill.

## 2016-01-30 ENCOUNTER — Ambulatory Visit (INDEPENDENT_AMBULATORY_CARE_PROVIDER_SITE_OTHER): Payer: No Typology Code available for payment source | Admitting: Family Medicine

## 2016-01-30 ENCOUNTER — Encounter: Payer: Self-pay | Admitting: Family Medicine

## 2016-01-30 VITALS — BP 116/76 | HR 94 | Temp 97.9°F | Wt 169.5 lb

## 2016-01-30 DIAGNOSIS — N939 Abnormal uterine and vaginal bleeding, unspecified: Secondary | ICD-10-CM | POA: Insufficient documentation

## 2016-01-30 LAB — CBC WITH DIFFERENTIAL/PLATELET
BASOS PCT: 0.4 % (ref 0.0–3.0)
Basophils Absolute: 0.1 10*3/uL (ref 0.0–0.1)
EOS ABS: 0.1 10*3/uL (ref 0.0–0.7)
Eosinophils Relative: 0.8 % (ref 0.0–5.0)
HEMATOCRIT: 44.9 % (ref 36.0–46.0)
Hemoglobin: 15.3 g/dL — ABNORMAL HIGH (ref 12.0–15.0)
LYMPHS ABS: 2.7 10*3/uL (ref 0.7–4.0)
LYMPHS PCT: 21.3 % (ref 12.0–46.0)
MCHC: 34.1 g/dL (ref 30.0–36.0)
MCV: 101.8 fl — ABNORMAL HIGH (ref 78.0–100.0)
Monocytes Absolute: 0.9 10*3/uL (ref 0.1–1.0)
Monocytes Relative: 7.3 % (ref 3.0–12.0)
NEUTROS ABS: 8.8 10*3/uL — AB (ref 1.4–7.7)
NEUTROS PCT: 70.2 % (ref 43.0–77.0)
PLATELETS: 279 10*3/uL (ref 150.0–400.0)
RBC: 4.41 Mil/uL (ref 3.87–5.11)
RDW: 12.4 % (ref 11.5–15.5)
WBC: 12.5 10*3/uL — ABNORMAL HIGH (ref 4.0–10.5)

## 2016-01-30 LAB — HCG, QUANTITATIVE, PREGNANCY: Quantitative HCG: 0.41 m[IU]/mL

## 2016-01-30 MED ORDER — IBUPROFEN 600 MG PO TABS: 600.0000 mg | ORAL_TABLET | Freq: Three times a day (TID) | ORAL | Status: DC | PRN

## 2016-01-30 NOTE — Patient Instructions (Signed)
Nice to meet you. Please call Dr. Arelia SneddonMcComb today and keep us updated.

## 2016-01-30 NOTE — Progress Notes (Signed)
Pre visit review using our clinic review tool, if applicable. No additional management support is needed unless otherwise documented below in the visit note. 

## 2016-01-30 NOTE — Assessment & Plan Note (Signed)
Possibly post menopausal. She does have family h/o cervical and ovarian cancer but not endometrial. Advised to call her GYN ASAP for work up as she would prefer to proceed with him- needs US and probable endometrial biopsy. The patient indicates understanding of these issues and agrees with the plan. Labs today - CBC and hcg quant to rule out pregnancy/abortion. eRx sent for Ibuprofen for cramping.  She requested tramadol but I advised she should talk to PCP about that as it is not warranted in this situation. The patient indicates understanding of these issues and agrees with the plan.

## 2016-01-30 NOTE — Progress Notes (Signed)
Subjective:   Patient ID: Tricia [name], female    DOB: 05/15/1966, 50 y.o.   MRN: [id number]  Tricia [name] is a pleasant 50 y.o. year old female pt of Dr. Para Marchuncan, new to me, who presents to clinic today with Menorrhagia  on 01/30/2016  HPI: Has not had a period in over a year.  This past week, developed heavy vaginal bleeding and cramping.  She has an OBGYN- Dr. Arelia SneddonMccomb.  Denies any nausea, vomiting, fevers.  Current Outpatient Prescriptions on File Prior to Visit  Medication Sig Dispense Refill  . EPINEPHrine (EPIPEN) 0.3 mg/0.3 mL DEVI Inject 0.3 mLs (0.3 mg total) into the muscle as needed. 1 Device 0  . FLUoxetine (PROZAC) 40 MG capsule Take 1 capsule (40 mg total) by mouth daily. 30 capsule 2  . metoprolol tartrate (LOPRESSOR) 25 MG tablet Take 1 tablet (25 mg total) by mouth 2 (two) times daily. 60 tablet 12  . traZODone (DESYREL) 150 MG tablet Take 1 tablet (150 mg total) by mouth at bedtime as needed for sleep. may add 50 mg prn to dose of 200 mg 30 tablet 2   No current facility-administered medications on file prior to visit.    Allergies  Allergen Reactions  . Bentyl [Dicyclomine Hcl]     tingling  . Clarithromycin     REACTION: nausea and vomiting    Past Medical History  Diagnosis Date  . Anxiety     Panic  . Interstitial cystitis     per [facility]  . Cardiomyopathy     EF 45% by myoview, 45-50% by echo, 50% by MRI. ? PVC-associated cardiomyopathy  . Chest pain     ETT myoview 4/11) 6'15", frequent PVCs and run NSVT, mild global HK with EF 45%, fixed anterior defect was likely soft tissue attenuation so no evidence for ischemia or infarction.  . Abnormal MRI, cervical spine 08/2010    with uncovertebral disease on the left at C6-7 encroaching on the left C7 root.  . Toe fracture, right 11/04/11    2,3rd toes  . Insomnia   . Premature ventricular contractions     resolved with metoprolol  . Depression     suicidal thoughts 2013, with BH eval    . Angioedema 12/2012    ? food allergy - ED eval  . Irritable bowel syndrome with diarrhea   . Arrhythmia     Past Surgical History  Procedure Laterality Date  . Ganglion cyst excision  2011    Dr. Teressa SenterSypher  . C6-7 fusion  2011    Dr. Phoebe PerchHirsch  . Cysto with hydrodistension  11/29/2011    Procedure: CYSTOSCOPY/HYDRODISTENSION;  Surgeon: Kathi LudwigSigmund I Tannenbaum, MD;  Location: [facility];  Service: Urology;  Laterality: N/A;  M & P   . Back surgery    . Colonoscopy  05/07/2013    Family History  Problem Relation Age of Onset  . Heart disease Maternal Grandfather [id number]    CABG  . Heart disease Cousin     Aortic dissection  . Alcohol abuse Cousin   . Alcohol abuse Maternal Uncle   . Alcohol abuse Paternal Uncle   . Alcohol abuse Paternal Grandfather     Social History   Social History  . Marital Status: Legally Separated    Spouse Name: N/A  . Number of Children: 1  . Years of Education: N/A   Occupational History  . 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 Smoking status: Current Some Day Smoker  . Smokeless tobacco: Never Used     Comment: smokes maybe one a week  . Alcohol Use: No  . Drug Use: No  . Sexual Activity: Yes    Birth Control/ Protection: Condom   Other Topics Concern  . Not on file   Social History Narrative   Divorced, one daughter    Lives in [place]   She is a Sales promotion account executive at [employer]   She has been a regular user of alcohol   The PMH, PSH, Social History, Family History, Medications, and allergies have been reviewed in [facility], and have been updated if relevant.   Review of Systems  Constitutional: Negative.   HENT: Negative.   Eyes: Negative.   Respiratory: Negative.   Cardiovascular: Negative.   Gastrointestinal: Negative.   Endocrine: Negative.   Genitourinary: Positive for vaginal bleeding and menstrual problem. Negative for hematuria, genital sores and pelvic pain.  Musculoskeletal: Negative.    Skin: Negative.   Allergic/Immunologic: Negative.   Neurological: Negative.   Hematological: Negative.   Psychiatric/Behavioral: Negative.   All other systems reviewed and are negative.      Objective:    BP 116/76 mmHg  Pulse 94  Temp(Src) 97.9 F (36.6 C) (Oral)  Wt 169 lb 8 oz (76.885 kg)  SpO2 98%  LMP 02/06/2015   Physical Exam  Constitutional: She is oriented to person, place, and time. She appears well-developed and well-nourished. No distress.  HENT:  Head: Normocephalic and atraumatic.  Eyes: Conjunctivae are normal.  Neck: Normal range of motion.  Cardiovascular: Normal rate and regular rhythm.   Pulmonary/Chest: Effort normal and breath sounds normal.  Musculoskeletal: Normal range of motion.  Neurological: She is alert and oriented to person, place, and time. No cranial nerve deficit.  Skin: Skin is warm and dry. She is not diaphoretic.  Psychiatric: She has a normal mood and affect. Her behavior is normal. Judgment and thought content normal.  Nursing note and vitals reviewed.         Assessment & Plan:   Vaginal bleeding - Plan: CBC with Differential/Platelet, hCG, quantitative, pregnancy No Follow-up on file.

## 2016-02-05 ENCOUNTER — Encounter: Payer: Self-pay | Admitting: *Deleted

## 2016-02-09 ENCOUNTER — Other Ambulatory Visit: Payer: Self-pay

## 2016-05-06 ENCOUNTER — Emergency Department (HOSPITAL_COMMUNITY)
Admission: EM | Admit: 2016-05-06 | Discharge: 2016-05-06 | Discharge status: Against Medical Advice | Payer: No Typology Code available for payment source | Attending: Emergency Medicine | Admitting: Emergency Medicine
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
## 2016-05-08 ENCOUNTER — Telehealth: Payer: Self-pay | Admitting: Family Medicine
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
## 2016-05-08 NOTE — Telephone Encounter (Signed)
Thanks for trying

## 2016-05-08 NOTE — Telephone Encounter (Signed)
Tried to call patient and received message that customer can not receive calls at this time and then got a  busy signal.

## 2016-05-08 NOTE — Telephone Encounter (Signed)
Tried to call patient again and got message stating that "customer can not received messages at this time" and then a busy signal.

## 2016-07-21 ENCOUNTER — Other Ambulatory Visit: Payer: Self-pay | Admitting: Family Medicine

## 2016-07-21 NOTE — Telephone Encounter (Signed)
Last office visit 01/30/16 with Dr. Dayton MartesAron.  Last refilled 09/26/15 for #30 with 2 refills.  Ok to refill?

## 2016-07-21 NOTE — Telephone Encounter (Signed)
Sent. Needs follow-up office visit, when possible. Thanks.

## 2016-07-22 NOTE — Telephone Encounter (Signed)
Left message on voicemail for patient to call back. 

## 2016-07-26 NOTE — Telephone Encounter (Signed)
Left detailed message on voicemail.  

## 2017-05-01 ENCOUNTER — Other Ambulatory Visit: Payer: Self-pay | Admitting: Family Medicine

## 2017-05-01 NOTE — Telephone Encounter (Signed)
Electronic refill request. Trazodone Last office visit:   01/30/16 Last Filled:    30 tablet 2 07/21/2016  Please advise.

## 2017-05-02 NOTE — Telephone Encounter (Signed)
Sent.  Needs f/u when possible.

## 2017-05-02 NOTE — Telephone Encounter (Signed)
Left detailed message on voicemail to schedule f/u appt.

## 2018-08-15 ENCOUNTER — Encounter (HOSPITAL_COMMUNITY): Payer: Self-pay

## 2018-09-04 ENCOUNTER — Emergency Department
Admission: EM | Admit: 2018-09-04 | Discharge: 2018-09-04 | Disposition: A | Discharge status: Home / Self Care | Payer: No Typology Code available for payment source | Attending: Student in an Organized Health Care Education/Training Program | Admitting: Student in an Organized Health Care Education/Training Program

## 2018-09-04 ENCOUNTER — Other Ambulatory Visit: Payer: Self-pay

## 2018-09-04 ENCOUNTER — Encounter: Payer: Self-pay | Admitting: Emergency Medicine

## 2018-09-04 ENCOUNTER — Emergency Department: Payer: No Typology Code available for payment source

## 2018-09-04 DIAGNOSIS — X58XXXA Exposure to other specified factors, initial encounter: Secondary | ICD-10-CM | POA: Insufficient documentation

## 2018-09-04 DIAGNOSIS — N760 Acute vaginitis: Secondary | ICD-10-CM | POA: Insufficient documentation

## 2018-09-04 DIAGNOSIS — M79671 Pain in right foot: Secondary | ICD-10-CM

## 2018-09-04 DIAGNOSIS — Y999 Unspecified external cause status: Secondary | ICD-10-CM | POA: Insufficient documentation

## 2018-09-04 DIAGNOSIS — B029 Zoster without complications: Secondary | ICD-10-CM | POA: Insufficient documentation

## 2018-09-04 DIAGNOSIS — N898 Other specified noninflammatory disorders of vagina: Secondary | ICD-10-CM | POA: Insufficient documentation

## 2018-09-04 DIAGNOSIS — S92344A Nondisplaced fracture of fourth metatarsal bone, right foot, initial encounter for closed fracture: Secondary | ICD-10-CM

## 2018-09-04 DIAGNOSIS — B9689 Other specified bacterial agents as the cause of diseases classified elsewhere: Secondary | ICD-10-CM | POA: Insufficient documentation

## 2018-09-04 DIAGNOSIS — Y939 Activity, unspecified: Secondary | ICD-10-CM | POA: Insufficient documentation

## 2018-09-04 DIAGNOSIS — Y929 Unspecified place or not applicable: Secondary | ICD-10-CM | POA: Insufficient documentation

## 2018-09-04 MED ORDER — GABAPENTIN 100 MG PO CAPS: 100.0000 mg | ORAL_CAPSULE | Freq: Three times a day (TID) | ORAL | 0 refills | Status: DC

## 2018-09-04 MED ORDER — HYDROCODONE-ACETAMINOPHEN 5-325 MG PO TABS
1.0000 | ORAL_TABLET | Freq: Once | ORAL | Status: AC
  Administered 2018-09-04: 1 via ORAL
  Filled 2018-09-04: qty 1

## 2018-09-04 MED ORDER — METRONIDAZOLE 0.75 % VA GEL: 1.0000 | Freq: Two times a day (BID) | VAGINAL | 0 refills | Status: DC

## 2018-09-04 MED ORDER — HYDROCODONE-ACETAMINOPHEN 5-325 MG PO TABS: 1.0000 | ORAL_TABLET | Freq: Three times a day (TID) | ORAL | 0 refills | Status: DC | PRN

## 2018-09-04 NOTE — ED Notes (Signed)
This RN reviewed discharge instructions, follow-up care, prescriptions, cryotherapy, and need for elevation with patient. Patient verbalized understanding of all reviewed information.  Patient stable, with no distress noted at this time. 

## 2018-09-04 NOTE — Discharge Instructions (Addendum)
You have been diagnosed with shingles.  You are too far out to have the antiviral treatment.  I have provided you with a prescription for neuropathic pain medication that you can take every 8 hours.  Shingles can be harmful to infants less than 51 years of age, pregnant women, or patients undergoing chemotherapy or that of had transplants.  Please try to stay away from these people until your shingles has resolved.  I have given you prescription for MetroGel to use twice a day for 5 days for your vaginal infection.  We have placed you in a postop shoe for your metatarsal fracture.  I have provided you with a prescription for Vicodin that you can take every 8 hours as needed for pain.  We will need you to follow-up with orthopedics as an outpatient for further evaluation.

## 2018-09-04 NOTE — ED Provider Notes (Signed)
[facility] Emergency Department Provider Note ____________________________________________  Time seen: 1825  I have reviewed the triage vital signs and the nursing notes.  HISTORY  Chief Complaint  Rash   HPI Tricia [name] is a 52 y.o. female presents to the ER today with complaint of a rash that started on the left side of her back and extends around the left side of her abdomen.  She reports the rash is very painful.  The rash has not spread.  She denies changes in soaps, lotions or detergents.  No one in her home has a similar rash.  She has not tried anything over-the-counter for this.  She also reports recurrent BV infection.  She reports she was seen at an urgent care 3 weeks ago.  They did a pelvic exam and wet prep which was positive for bacterial vaginosis.  She reports they gave her prescription for Flagyl, however she lost this prescription before she can have it filled.  She would like to know if she could have another prescription for Flagyl today.  She also complains of right foot pain.  This started 1 month ago.  She describes the pain is sharp and stabbing.  He denies numbness, tingling or weakness.  The pain is worse with ambulation.  She denies any injury to the area.  She has not taken anything over-the-counter for her pain.  Past Medical History:  Diagnosis Date  . Abnormal MRI, cervical spine 08/2010   with uncovertebral disease on the left at C6-7 encroaching on the left C7 root.  . Angioedema 12/2012   ? food allergy - ED eval  . Anxiety    Panic  . Arrhythmia   . Cardiomyopathy    EF 45% by myoview, 45-50% by echo, 50% by MRI. ? PVC-associated cardiomyopathy  . Chest pain    ETT myoview 4/11) 6'15", frequent PVCs and run NSVT, mild global HK with EF 45%, fixed anterior defect was likely soft tissue attenuation so no evidence for ischemia or infarction.  . Depression    suicidal thoughts 2013, with BH eval  . Insomnia   . Interstitial  cystitis    per [facility]  . Irritable bowel syndrome with diarrhea   . Premature ventricular contractions    resolved with metoprolol  . Toe fracture, right 11/04/11   2,3rd toes    Patient Active Problem List   Diagnosis Date Noted  . Vaginal bleeding 01/30/2016  . Cough 11/02/2015  . Fracture of multiple ribs 09/27/2015  . Drug abuse, marijuana 08/21/2015  . Noncompliance with therapeutic plan 08/21/2015  . Sexuality issues 08/21/2015  . Substance induced mood disorder (HCC) 08/21/2015  . Cannabis abuse, episodic use 08/07/2015  . Chronic post-traumatic stress disorder (PTSD) 07/31/2015  . PTSD (post-traumatic stress disorder) 07/31/2015  . Acute alcoholic hepatitis 07/03/2015  . Macrocytosis without anemia 07/03/2015  . Alcohol dependence with other alcohol-induced disorder (HCC) 07/03/2015  . Cervical cancer screening 06/28/2015  . Encounter for other general counseling or advice on contraception 01/13/2015  . Neck mass 06/15/2014  . Chest Ybanez pain 05/10/2014  . Bladder spasm 12/17/2013  . IC (interstitial cystitis) 12/17/2013  . IBS (irritable bowel syndrome) - diarrhea predominant 05/21/2013  . Alcohol abuse, daily use 01/16/2012  . Right foot pain 11/06/2011  . GANGLION CYST, WRIST, LEFT 06/05/2010  . CARDIOMYOPATHY 04/06/2010  . VENTRICULAR TACHYCARDIA 02/28/2010  . DYSPNEA ON EXERTION 01/31/2010  . ALLERGIC RHINITIS 03/16/2009  . Insomnia 08/02/2008  . THUMB PAIN, RIGHT  05/26/2008  . MDD (major depressive disorder) 03/16/2008  . HEADACHE 03/16/2008  . Generalized anxiety disorder 03/30/2007  . INTERSTITIAL CYSTITIS 11/12/1999    Past Surgical History:  Procedure Laterality Date  . BACK SURGERY    . C6-7 fusion  2011   Dr. [name]  . COLONOSCOPY  05/07/2013  . CYSTO WITH HYDRODISTENSION  11/29/2011   Procedure: CYSTOSCOPY/HYDRODISTENSION;  Surgeon: Kathi Ludwig, MD;  Location: [facility];  Service: Urology;  Laterality: N/A;   M & P   . GANGLION CYST EXCISION  2011   Dr. Teressa Senter    Prior to Admission medications   Medication Sig Start Date End Date Taking? Authorizing Provider  EPINEPHrine (EPIPEN) 0.3 mg/0.3 mL DEVI Inject 0.3 mLs (0.3 mg total) into the muscle as needed. 12/30/12   Loren Racer, MD  gabapentin (NEURONTIN) 100 MG capsule Take 1 capsule (100 mg total) by mouth 3 (three) times daily. 09/04/18   Lorre Munroe, NP  HYDROcodone-acetaminophen (NORCO/VICODIN) 5-325 MG tablet Take 1 tablet by mouth every 8 (eight) hours as needed for moderate pain. 09/04/18   Lorre Munroe, NP  ibuprofen (ADVIL,MOTRIN) 600 MG tablet Take 1 tablet (600 mg total) by mouth every 8 (eight) hours as needed. 01/30/16   Dianne Dun, MD  metoprolol tartrate (LOPRESSOR) 25 MG tablet Take 1 tablet (25 mg total) by mouth 2 (two) times daily. 09/26/15   [name], MD  metroNIDAZOLE (METROGEL) 0.75 % vaginal gel Place 1 Applicatorful vaginally 2 (two) times daily. 09/04/18   Lorre Munroe, NP  traZODone (DESYREL) 150 MG tablet TAKE ONE TABLET BY MOUTH AT BEDTIME AS NEEDED FOR SLEEP MAY  ADD  50  MG  AS  NEEDED  FOR  DOSE  OF  200  MG 05/02/17   [name], MD    Allergies Bentyl [dicyclomine hcl] and Clarithromycin  Family History  Problem Relation Age of Onset  . Heart disease Maternal Grandfather 46       CABG  . Heart disease Cousin        Aortic dissection  . Alcohol abuse Cousin   . Alcohol abuse Maternal Uncle   . Alcohol abuse Paternal Uncle   . Alcohol abuse Paternal Grandfather     Social History Social History   Tobacco Use  . Smoking status: Current Some Day Smoker  . Smokeless tobacco: Never Used  . Tobacco comment: smokes maybe one a week  Substance Use Topics  . Alcohol use: No    Alcohol/week: 35.0 standard drinks    Types: 15 Glasses of wine, 20 Shots of liquor per week  . Drug use: No    Review of Systems  Constitutional: Negative for fever, chills or body  aches. Gastrointestinal: Negative for abdominal pain, vomiting and diarrhea. Genitourinary: Positive for vaginal discharge. Negative for urgency, frequency, dysuria. Musculoskeletal: Positive for right foot pain, difficulty with gait. Skin: Positive for rash. Neurological: Negative for headaches, focal weakness, tingling or numbness. ____________________________________________  PHYSICAL EXAM:  VITAL SIGNS: ED Triage Vitals  Enc Vitals Group     BP 09/04/18 1718 123/79     Pulse Rate 09/04/18 1718 92     Resp 09/04/18 1718 16     Temp 09/04/18 1718 97.8 F (36.6 C)     Temp Source 09/04/18 1718 Oral     SpO2 09/04/18 1718 96 %     Weight 09/04/18 1713 165 lb (74.8 kg)     Height 09/04/18 1713 5\' 8"  (  1.727 m)     Head Circumference --      Peak Flow --      Pain Score 09/04/18 1713 10     Pain Loc --      Pain Edu? --      Excl. in GC? --     Constitutional: Alert and oriented. Well appearing and in no distress. Cardiovascular: Pedal pulse 2+ bilaterally. Unable to check cap refill secondary to nail polish. Gastrointestinal: Soft and nontender. No CVA tenderness noted. Pelvic: Pt declines exam.  Musculoskeletal: Normal flexion, extension and rotation of right ankle. 1+ swelling noted over the lateral malleolus, but she denies pain at this site with palpation. Pain with palpation at the proximal 4th metatarsal. No bruising or swelling noted. Gait not visualized. Neurologic:  Sensation intact to BLE. Skin:  Grouped scabbed lesions on erythematous base noted at midline back extending around in dermatomal pattern to midline abdomen. ____________________________________________   RADIOLOGY  Imaging Orders     DG Foot Complete Right IMPRESSION: 1. Subtle periosteal reaction along the medial distal shaft of the 4th metatarsal is suspicious for stress fracture. 2. No other acute osseous abnormality identified in the right foot. 3. 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 ____________________________________________    INITIAL IMPRESSION / ASSESSMENT AND PLAN / ED COURSE  Rash of Back/Abdomen c/w Shingles"  Too far out for antiviral therapy Hydrocodone-Acetominophen 5-325 mg given in ER RX for Neurontin 100 mg TID  X 10 days  Vaginal Discharge, Recurrent BV:  She declines pelvic exam today She reports she had a RX for oral Flagyl Upon chart review and given history of ETOH abuse, will provide RX for Metrogel BID x 5 days  Right Foot Pain:  Xray right foot c/w stress fracture Pt placed in post op shoe Advised to follow up with ortho as outpatient RX for Hydrocodone-Acetaminophen 5-325 mg 1 tab PO Q8H prn pain    I reviewed the patient's prescription history over the last 12 months in the multi-state controlled substances database(s) that includes [place], [place], [place], [place], [place], [place], [place], [place], [place], [place], [place], [place], [place], and [place].  Results were notable for no controlled substance filled. ____________________________________________  FINAL CLINICAL IMPRESSION(S) / ED DIAGNOSES  Final diagnoses:  Herpes zoster without complication  Vaginal discharge  BV (bacterial vaginosis)  Right foot pain  Closed nondisplaced fracture of fourth metatarsal bone of right foot, initial encounter       Lorre Munroe, NP 09/04/18 1907    Willy Eddy, MD 09/04/18 [id number]

## 2018-09-04 NOTE — ED Notes (Signed)
See triage note  Presents with rash to flank area for 1 month  States area is painful

## 2018-09-04 NOTE — ED Triage Notes (Signed)
C/O painful rash to left flank x 1 month.  Patient also c/o right foot pain x 1 month.  Patient states she has a small cut to right foot x 1 month from Eczema.

## 2018-09-04 NOTE — ED Notes (Signed)
ED Provider at bedside. 

## 2020-06-12 ENCOUNTER — Other Ambulatory Visit: Payer: Self-pay

## 2020-06-12 ENCOUNTER — Ambulatory Visit (LOCAL_COMMUNITY_HEALTH_CENTER): Payer: Self-pay | Admitting: Advanced Practice Midwife

## 2020-06-12 ENCOUNTER — Encounter: Payer: Self-pay | Admitting: Advanced Practice Midwife

## 2020-06-12 VITALS — BP 133/94 | Ht 68.0 in | Wt 189.4 lb

## 2020-06-12 DIAGNOSIS — L309 Dermatitis, unspecified: Secondary | ICD-10-CM

## 2020-06-12 DIAGNOSIS — F17200 Nicotine dependence, unspecified, uncomplicated: Secondary | ICD-10-CM | POA: Insufficient documentation

## 2020-06-12 DIAGNOSIS — Z30018 Encounter for initial prescription of other contraceptives: Secondary | ICD-10-CM

## 2020-06-12 DIAGNOSIS — R030 Elevated blood-pressure reading, without diagnosis of hypertension: Secondary | ICD-10-CM

## 2020-06-12 DIAGNOSIS — Z3009 Encounter for other general counseling and advice on contraception: Secondary | ICD-10-CM

## 2020-06-12 MED ORDER — MULTIVITAMINS PO CAPS: 1.0000 | ORAL_CAPSULE | Freq: Every day | ORAL | 0 refills | Status: DC

## 2020-06-12 MED ORDER — METRONIDAZOLE 500 MG PO TABS: 500.0000 mg | ORAL_TABLET | Freq: Two times a day (BID) | ORAL | 0 refills | Status: AC

## 2020-06-12 NOTE — Progress Notes (Signed)
[name] Main Number: [phone]    Family Planning Visit- Initial Visit  Subjective:  Tricia [name] is a 54 y.o. SWF [id number](95 yo daughter) smoker   being seen today for an initial well woman visit and to discuss family planning options.  She is currently using None for pregnancy prevention. Patient reports she does not want a pregnancy in the next year.  Patient has the following medical conditions has Generalized anxiety disorder; MDD (major depressive disorder); CARDIOMYOPATHY; VENTRICULAR TACHYCARDIA; ALLERGIC RHINITIS; INTERSTITIAL CYSTITIS; GANGLION CYST, WRIST, LEFT; THUMB PAIN, RIGHT; Insomnia; HEADACHE; DYSPNEA ON EXERTION; Right foot pain; Alcohol abuse, daily use; IBS (irritable bowel syndrome) - diarrhea predominant; Bladder spasm; IC (interstitial cystitis); Chest Thul pain; Neck mass; Encounter for other general counseling or advice on contraception; Cervical cancer screening; Acute alcoholic hepatitis; Macrocytosis without anemia; Alcohol dependence with other alcohol-induced disorder ([place]); Chronic post-traumatic stress disorder (PTSD); PTSD (post-traumatic stress disorder); Cannabis abuse, episodic use; Drug abuse, marijuana; Noncompliance with therapeutic plan; Sexuality issues; Substance induced mood disorder ([place]); Fracture of multiple ribs; Cough; Vaginal bleeding; Elevated blood pressure reading 133/94 06/12/20; and Eczema on their problem list.  Chief Complaint  Patient presents with  . Contraception    Annual Physical    Patient reports partner "is a carrier for Hepatitis but I don't know which kind".  Pt wants birth control and states having menses monthly now.  Smoker 2-3 cpd.  LMP 05/25/20.  Lst sex 06/03/20 without condom; with current partner x 2.5 years.  1 partner in last 3 mo; 1 partner in last 12 mo.  Last pap 06/27/15 neg HPV neg.  Last ETOH last night (2 glasses wine) qoday (declines ETOH  cessation asssistance).  Not working.  Living with her mom.  No income past 3 years.  C/o lower back and low abdominal pain x 3-4 mo intermittently, urinary frequency x 3-4 mo, eczema  Patient denies MJ use.  Body mass index is 28.8 kg/m. - Patient is eligible for diabetes screening based on BMI and age >33?  yes HA1C ordered? no  Patient reports 1  partner in last year. Desires STI screening?  Yes  Has patient been screened once for HCV in the past?  No  No results found for: HCVAB  Does the patient have current drug use (including MJ), have a partner with drug use, and/or has been incarcerated since last result? Yes  If yes-- Screen for HCV through [facility]   Does the patient meet criteria for HBV testing? Yes  Criteria:  -Household, sexual or needle sharing contact with HBV -History of drug use -HIV positive -Those with known Hep C   Health Maintenance Due  Topic Date Due  . Hepatitis C Screening  Never done  . COVID-19 Vaccine (1) Never done  . MAMMOGRAM  12/19/2016  . PAP SMEAR-Modifier  06/26/2018  . TETANUS/TDAP  10/26/2018  . INFLUENZA VACCINE  06/11/2020    Review of Systems  Genitourinary: Positive for frequency (c/o urinary frequency and pain in low abdomen and low back x 3-4 mo).  Musculoskeletal: Positive for back pain (low back pain x 3-4 mo).  All other systems reviewed and are negative.   The following portions of the patient's history were reviewed and updated as appropriate: allergies, current medications, past family history, past medical history, past social history, past surgical history and problem list. Problem list updated.   See flowsheet for other program required questions.  Objective:   Vitals:   06/12/20 1515  BP: (!) 133/94  Weight: 189 lb 6.4 oz (85.9 kg)  Height: '5\' 8"'$  (1.727 m)    Physical Exam Constitutional:      Appearance: Normal appearance.  HENT:     Head: Normocephalic and atraumatic.     Mouth/Throat:     Mouth:  Mucous membranes are moist.  Eyes:     Conjunctiva/sclera: Conjunctivae normal.  Cardiovascular:     Rate and Rhythm: Normal rate and regular rhythm.  Pulmonary:     Effort: Pulmonary effort is normal.     Breath sounds: Normal breath sounds.  Chest:     Breasts:        Right: Normal.        Left: Normal.  Abdominal:     Palpations: Abdomen is soft.     Comments: Soft without masses  Genitourinary:    Exam position: Lithotomy position.     Vagina: Vaginal discharge (small amt creamy white leukorrhea, ph<4.5) present.     Cervix: Normal.     Uterus: Normal.      Adnexa: Right adnexa normal and left adnexa normal.     Rectum: Normal.       Comments: cx os pinpoint Erythematous labia and tissue without pruritis Musculoskeletal:        General: Normal range of motion.     Cervical back: Normal range of motion and neck supple.  Skin:    General: Skin is warm and dry.  Neurological:     Mental Status: She is alert.  Psychiatric:        Mood and Affect: Mood normal.       Assessment and Plan:  Tricia [name] is a 54 y.o. female presenting to the [facility] Department for an initial well woman exam/family planning visit  Contraception counseling: Reviewed all forms of birth control options in the tiered based approach. available including abstinence; over the counter/barrier methods; hormonal contraceptive medication including pill, patch, ring, injection,contraceptive implant, ECP; hormonal and nonhormonal IUDs; permanent sterilization options including vasectomy and the various tubal sterilization modalities. Risks, benefits, and typical effectiveness rates were reviewed.  Questions were answered.  Written information was also given to the patient to review.  Patient desires condoms only, this was prescribed for patient. She will follow up in prn for surveillance.  She was told to call with any further questions, or with any concerns about this method of  contraception.  Emphasized use of condoms 100% of the time for STI prevention.  Patient was offered ECP. ECP was not accepted by the patient. ECP counseling was not given - see RN documentation  1. Family planning Please give primary care MD list to pt to f/u with [place], elevated BP, eczema,mammogram, colonoscopy, numerous c/o Treat wet mount per standing orders Immunization nurse consult - WET PREP FOR [place], YEAST, CLUE - Chlamydia/Gonorrhea [facility] [employer] [facility] - Syphilis Serology, [place] [facility] - HIV/HCV [place] [facility] - HBV Antigen/Antibody [facility] - IGP, [place] HPV  2. Elevated blood pressure reading 133/94 06/12/20 Referred to primary care MD for evaluation  3. Eczema, unspecified type Referred to primary care MD for tx  4. Encounter for initial prescription of other contraceptives Pt only wants condoms for birth control today     No follow-ups on file.  Future Appointments  Date Time Provider [place]  06/28/2020 10:00 AM [facility] PSY ASS[facility]-PROVIDER [facility] None    Herbie Saxon, CNM

## 2020-06-12 NOTE — Progress Notes (Signed)
Here today for PE and STD screen. No PE's here to date. Last Pap Smear was 06/27/2015. Would like to discuss birth control options today. Declines bloodwork.

## 2020-06-12 NOTE — Progress Notes (Signed)
[place]et [place] results reviewed. Patient treated for BV per standing orders. Seann Genther, RN  

## 2020-06-13 LAB — WET PREP FOR TRICH, YEAST, CLUE
Trichomonas Exam: NEGATIVE
Yeast Exam: NEGATIVE

## 2020-06-14 LAB — IGP, APTIMA HPV
HPV Aptima: NEGATIVE
PAP Smear Comment: 0

## 2020-06-15 LAB — HEPATITIS B SURFACE ANTIGEN

## 2020-06-15 LAB — HM HIV SCREENING LAB: HM HIV Screening: NEGATIVE

## 2020-06-15 LAB — HM HEPATITIS C SCREENING LAB: HM Hepatitis Screen: NEGATIVE

## 2020-06-16 ENCOUNTER — Encounter: Payer: Self-pay | Admitting: Family Medicine

## 2020-06-28 ENCOUNTER — Telehealth (HOSPITAL_COMMUNITY): Payer: Self-pay

## 2020-08-10 ENCOUNTER — Telehealth: Payer: Self-pay | Admitting: General Practice

## 2020-08-10 NOTE — Telephone Encounter (Signed)
Patient called in stating she is wanting to re-establish with Dr.Duncan. Patient is now currently homeless and has no insurance. Would like to see PCP again. Please advise is possible.

## 2020-08-11 NOTE — Telephone Encounter (Signed)
Many thanks. 

## 2020-08-11 NOTE — Telephone Encounter (Signed)
There is some information printed for the pt concerning state and local and private resources. Front office should have financial assistance paperwork that pt can pick up when she comes for apt. Once more information is known after OV then specific referrals could be placed depending on pt need.   Information printed of pt in RN office.

## 2020-08-11 NOTE — Telephone Encounter (Signed)
Yes. OV when possible.  Is there a way to get this patient some financial help with OV here?  Any options with social work?

## 2020-08-14 NOTE — Telephone Encounter (Signed)
Called patient to schedule appointment. LVM to give [place] a call back to schedule.

## 2020-08-14 NOTE — Telephone Encounter (Signed)
Pt said she needed an appt as soon as possible for her to get in. I scheduled patient for 08/18/20.

## 2020-08-18 ENCOUNTER — Ambulatory Visit (INDEPENDENT_AMBULATORY_CARE_PROVIDER_SITE_OTHER): Payer: No Typology Code available for payment source | Admitting: Family Medicine

## 2020-08-18 ENCOUNTER — Other Ambulatory Visit: Payer: Self-pay

## 2020-08-18 ENCOUNTER — Encounter: Payer: Self-pay | Admitting: Family Medicine

## 2020-08-18 VITALS — BP 122/80 | HR 93 | Temp 96.9°F | Ht 67.0 in | Wt 190.5 lb

## 2020-08-18 DIAGNOSIS — L309 Dermatitis, unspecified: Secondary | ICD-10-CM

## 2020-08-18 DIAGNOSIS — Z7189 Other specified counseling: Secondary | ICD-10-CM

## 2020-08-18 DIAGNOSIS — H9190 Unspecified hearing loss, unspecified ear: Secondary | ICD-10-CM

## 2020-08-18 DIAGNOSIS — F319 Bipolar disorder, unspecified: Secondary | ICD-10-CM

## 2020-08-18 MED ORDER — TRIAMCINOLONE ACETONIDE 0.5 % EX CREA: 1.0000 "application " | TOPICAL_CREAM | Freq: Two times a day (BID) | CUTANEOUS | 1 refills | Status: DC | PRN
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
## 2020-08-18 NOTE — Patient Instructions (Signed)
We'll request your old records.  Try using triamcinolone cream in the meantime.  We'll call about seeing the ENT clinic.  Take care.  Glad to see you.

## 2020-08-20 ENCOUNTER — Telehealth (HOSPITAL_COMMUNITY): Payer: Self-pay | Admitting: Adult Health

## 2020-08-23 ENCOUNTER — Encounter: Payer: Self-pay | Admitting: Family Medicine

## 2020-08-23 DIAGNOSIS — Z7189 Other specified counseling: Secondary | ICD-10-CM | POA: Insufficient documentation

## 2020-08-23 DIAGNOSIS — F319 Bipolar disorder, unspecified: Secondary | ICD-10-CM | POA: Insufficient documentation

## 2020-08-23 DIAGNOSIS — H9190 Unspecified hearing loss, unspecified ear: Secondary | ICD-10-CM | POA: Insufficient documentation

## 2020-08-23 NOTE — Assessment & Plan Note (Signed)
This could be an eczema variant versus psoriasis.  Discussed options.  Start using topical triamcinolone and update me as needed.  She agrees with plan.  None of the lesions appear infected.

## 2020-08-23 NOTE — Assessment & Plan Note (Signed)
Per outside clinic, requesting records.

## 2020-08-23 NOTE — Assessment & Plan Note (Signed)
Hearing loss on the right side with we were testing louder on the left side.  I do not know why she has hearing loss, discussed with patient.  Needs ENT evaluation.  Her cranial nerves are intact otherwise.  Refer to ENT.  Discussed with patient.  She understood.

## 2020-08-23 NOTE — Assessment & Plan Note (Signed)
Advanced directive discussed with patient.  Mother or brother are equally designated if patient were incapacitated.

## 2020-08-30 ENCOUNTER — Telehealth (INDEPENDENT_AMBULATORY_CARE_PROVIDER_SITE_OTHER): Payer: No Payment, Other | Admitting: Psychiatry

## 2020-08-30 ENCOUNTER — Encounter (HOSPITAL_COMMUNITY): Payer: Self-pay | Admitting: Psychiatry

## 2020-08-30 ENCOUNTER — Other Ambulatory Visit: Payer: Self-pay

## 2020-08-30 DIAGNOSIS — F4310 Post-traumatic stress disorder, unspecified: Secondary | ICD-10-CM

## 2020-08-30 DIAGNOSIS — F3131 Bipolar disorder, current episode depressed, mild: Secondary | ICD-10-CM

## 2020-08-30 MED ORDER — HYDROXYZINE HCL 10 MG PO TABS: 10.0000 mg | ORAL_TABLET | Freq: Three times a day (TID) | ORAL | 2 refills | Status: DC | PRN

## 2020-08-30 MED ORDER — MIRTAZAPINE 30 MG PO TABS: 30.0000 mg | ORAL_TABLET | Freq: Every day | ORAL | 2 refills | Status: DC

## 2020-08-30 MED ORDER — FLUOXETINE HCL 20 MG PO TABS: 20.0000 mg | ORAL_TABLET | Freq: Every day | ORAL | 2 refills | Status: DC

## 2020-08-30 MED ORDER — QUETIAPINE FUMARATE 100 MG PO TABS: 100.0000 mg | ORAL_TABLET | Freq: Every day | ORAL | 2 refills | Status: DC

## 2020-08-30 MED ORDER — LAMOTRIGINE 150 MG PO TABS: 150.0000 mg | ORAL_TABLET | Freq: Every day | ORAL | 2 refills | Status: DC
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
## 2020-09-07 ENCOUNTER — Other Ambulatory Visit (HOSPITAL_COMMUNITY): Payer: Self-pay | Admitting: Unknown Physician Specialty

## 2020-09-07 ENCOUNTER — Other Ambulatory Visit: Payer: Self-pay | Admitting: Unknown Physician Specialty

## 2020-09-07 DIAGNOSIS — H9121 Sudden idiopathic hearing loss, right ear: Secondary | ICD-10-CM

## 2020-09-24 ENCOUNTER — Ambulatory Visit
Admission: RE | Admit: 2020-09-24 | Discharge: 2020-09-24 | Disposition: A | Discharge status: Home / Self Care | Payer: Self-pay | Source: Ambulatory Visit | Attending: Unknown Physician Specialty | Admitting: Unknown Physician Specialty

## 2020-09-24 DIAGNOSIS — H9121 Sudden idiopathic hearing loss, right ear: Secondary | ICD-10-CM | POA: Insufficient documentation

## 2020-09-24 MED ORDER — GADOBUTROL 1 MMOL/ML IV SOLN
10.0000 mL | Freq: Once | INTRAVENOUS | Status: AC | PRN
  Administered 2020-09-24: 10 mL via INTRAVENOUS

## 2020-11-30 ENCOUNTER — Encounter (HOSPITAL_COMMUNITY): Payer: Self-pay | Admitting: Psychiatry

## 2020-11-30 ENCOUNTER — Other Ambulatory Visit: Payer: Self-pay

## 2020-11-30 ENCOUNTER — Telehealth (INDEPENDENT_AMBULATORY_CARE_PROVIDER_SITE_OTHER): Payer: No Payment, Other | Admitting: Psychiatry

## 2020-11-30 DIAGNOSIS — F3131 Bipolar disorder, current episode depressed, mild: Secondary | ICD-10-CM

## 2020-11-30 MED ORDER — HYDROXYZINE HCL 10 MG PO TABS: 10.0000 mg | ORAL_TABLET | Freq: Three times a day (TID) | ORAL | 2 refills | Status: DC | PRN

## 2020-11-30 MED ORDER — FLUOXETINE HCL 20 MG PO CAPS: 20.0000 mg | ORAL_CAPSULE | Freq: Every day | ORAL | 2 refills | Status: DC

## 2020-11-30 MED ORDER — MIRTAZAPINE 30 MG PO TABS: 30.0000 mg | ORAL_TABLET | Freq: Every day | ORAL | 2 refills | Status: DC

## 2020-11-30 MED ORDER — QUETIAPINE FUMARATE 100 MG PO TABS: 150.0000 mg | ORAL_TABLET | Freq: Every day | ORAL | 2 refills | Status: DC

## 2020-11-30 MED ORDER — LAMOTRIGINE 150 MG PO TABS: 150.0000 mg | ORAL_TABLET | Freq: Every day | ORAL | 2 refills | Status: DC
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
## 2021-01-01 ENCOUNTER — Other Ambulatory Visit: Payer: Self-pay

## 2021-01-01 ENCOUNTER — Emergency Department (HOSPITAL_COMMUNITY)
Admission: EM | Admit: 2021-01-01 | Discharge: 2021-01-02 | Disposition: A | Discharge status: Home / Self Care | Payer: No Typology Code available for payment source | Attending: Emergency Medicine | Admitting: Emergency Medicine

## 2021-01-01 ENCOUNTER — Encounter (HOSPITAL_COMMUNITY): Payer: Self-pay

## 2021-01-01 ENCOUNTER — Emergency Department (HOSPITAL_COMMUNITY): Payer: No Typology Code available for payment source

## 2021-01-01 DIAGNOSIS — Y907 Blood alcohol level of 200-239 mg/100 ml: Secondary | ICD-10-CM | POA: Insufficient documentation

## 2021-01-01 DIAGNOSIS — F319 Bipolar disorder, unspecified: Secondary | ICD-10-CM | POA: Insufficient documentation

## 2021-01-01 DIAGNOSIS — R0781 Pleurodynia: Secondary | ICD-10-CM

## 2021-01-01 DIAGNOSIS — I429 Cardiomyopathy, unspecified: Secondary | ICD-10-CM | POA: Insufficient documentation

## 2021-01-01 DIAGNOSIS — Z79899 Other long term (current) drug therapy: Secondary | ICD-10-CM | POA: Insufficient documentation

## 2021-01-01 DIAGNOSIS — F1010 Alcohol abuse, uncomplicated: Secondary | ICD-10-CM | POA: Insufficient documentation

## 2021-01-01 DIAGNOSIS — F17210 Nicotine dependence, cigarettes, uncomplicated: Secondary | ICD-10-CM | POA: Insufficient documentation

## 2021-01-01 DIAGNOSIS — W19XXXA Unspecified fall, initial encounter: Secondary | ICD-10-CM | POA: Insufficient documentation

## 2021-01-01 DIAGNOSIS — R0789 Other chest pain: Secondary | ICD-10-CM | POA: Insufficient documentation

## 2021-01-01 DIAGNOSIS — Y92008 Other place in unspecified non-institutional (private) residence as the place of occurrence of the external cause: Secondary | ICD-10-CM | POA: Insufficient documentation

## 2021-01-01 DIAGNOSIS — Y9389 Activity, other specified: Secondary | ICD-10-CM | POA: Insufficient documentation

## 2021-01-01 DIAGNOSIS — Y999 Unspecified external cause status: Secondary | ICD-10-CM | POA: Insufficient documentation

## 2021-01-01 MED ORDER — ACETAMINOPHEN 500 MG PO TABS
1000.0000 mg | ORAL_TABLET | Freq: Once | ORAL | Status: AC
  Administered 2021-01-01: 1000 mg via ORAL
  Filled 2021-01-01: qty 2

## 2021-01-01 NOTE — ED Triage Notes (Signed)
Patient BIB [facility] for RUQ pain which has been going on for 3 months. EMS reports patient was only oriented x 1-2. Family called EMS for patient not being completely responsive. Patient does drink on a daily basis. Patient c/o SOB however 98% RA.

## 2021-01-01 NOTE — ED Provider Notes (Signed)
[facility]-EMERGENCY DEPT Provider Note   CSN: [id number] Arrival date & time: 01/01/21  2017     History Chief Complaint  Patient presents with  . Abdominal Pain    RUQ pain    DAWT REEB is a 55 y.o. female.  The history is provided by the patient and medical records.    55 y.o. F with hx of cardiomyopathy, depression, HSV, insomnia, IBS, hx of alcoholism (drinks heavily daily basis) here right right sided pain.  States she has been having pain in right ribs for about 3 months now.  She did sustain a fall at home about a month ago and has been worse since that time.  She localizes pain to right ribs, worse with movement (esp twisting), deep breathing, etc.  She denies abdominal pain, nausea, vomiting, diarrhea, fever, chills, sweats.  No cough or URI symptoms.  States pain has made it to where she is mostly just laying out because it hurts too bad to move.  She states she did not want to tell her family about this so has not been evaluated.    Past Medical History:  Diagnosis Date  . Abnormal MRI, cervical spine 08/2010   with uncovertebral disease on the left at C6-7 encroaching on the left C7 root.  . Angioedema 12/2012   ? food allergy - ED eval  . Anxiety    Panic  . Arrhythmia   . Cardiomyopathy    EF 45% by myoview, 45-50% by echo, 50% by MRI. ? PVC-associated cardiomyopathy  . Chest pain    ETT myoview 4/11) 6'15", frequent PVCs and run NSVT, mild global HK with EF 45%, fixed anterior defect was likely soft tissue attenuation so no evidence for ischemia or infarction.  . Depression    suicidal thoughts 2013, with BH eval  . Herpes simplex virus (HSV) infection    15 years ago  . Insomnia   . Interstitial cystitis    per [facility]  . Irritable bowel syndrome with diarrhea   . Premature ventricular contractions    resolved with metoprolol  . Toe fracture, right 11/04/11   2,3rd toes  . Vaginal Pap smear, abnormal    Dysplasia 1994     Patient Active Problem List   Diagnosis Date Noted  . Unilateral hearing loss 08/23/2020  . Advance care planning 08/23/2020  . Bipolar affective disorder (HCC) 08/23/2020  . Eczema 06/12/2020  . Smoker 2-3 cpd 06/12/2020  . PTSD (post-traumatic stress disorder) 07/31/2015  . IC (interstitial cystitis) 12/17/2013  . IBS (irritable bowel syndrome) - diarrhea predominant 05/21/2013  . Right foot pain 11/06/2011  . CARDIOMYOPATHY 04/06/2010  . DYSPNEA ON EXERTION 01/31/2010  . ALLERGIC RHINITIS 03/16/2009  . Insomnia 08/02/2008    Past Surgical History:  Procedure Laterality Date  . BACK SURGERY    . C6-7 fusion  2011   Dr. [name]  . COLONOSCOPY  05/07/2013  . CYSTO WITH HYDRODISTENSION  11/29/2011   Procedure: CYSTOSCOPY/HYDRODISTENSION;  Surgeon: Kathi Ludwig, MD;  Location: [facility];  Service: Urology;  Laterality: N/A;  M & P   . GANGLION CYST EXCISION  2011   Dr. Teressa Senter     OB History    Gravida  1   Para  1   Term  1   Preterm  0   AB  0   Living  1     SAB  0   IAB  0   Ectopic  0   Multiple  0   Live Births  1           Family History  Problem Relation Age of Onset  . Heart disease Maternal Grandfather 45       CABG  . Heart disease Cousin        Aortic dissection  . Alcohol abuse Cousin   . Alcohol abuse Maternal Uncle   . Alcohol abuse Paternal Uncle   . Alcohol abuse Paternal Grandfather   . Cancer Maternal Grandmother        Ovarian    Social History   Tobacco Use  . Smoking status: Current Some Day Smoker    Packs/day: 0.50    Types: Cigarettes  . Smokeless tobacco: Never Used  Substance Use Topics  . Alcohol use: Yes    Alcohol/week: 6.0 standard drinks    Types: 4 Shots of liquor, 2 Glasses of wine per week  . Drug use: No    Home Medications Prior to Admission medications   Medication Sig Start Date End Date Taking? Authorizing Provider  FLUoxetine (PROZAC) 20 MG capsule Take 1  capsule (20 mg total) by mouth daily. 11/30/20   Shanna Cisco, NP  hydrOXYzine (ATARAX/VISTARIL) 10 MG tablet Take 1 tablet (10 mg total) by mouth 3 (three) times daily as needed. 11/30/20   Shanna Cisco, NP  lamoTRIgine (LAMICTAL) 150 MG tablet Take 1 tablet (150 mg total) by mouth daily. 11/30/20   Shanna Cisco, NP  mirtazapine (REMERON) 30 MG tablet Take 1 tablet (30 mg total) by mouth at bedtime. 11/30/20   Shanna Cisco, NP  QUEtiapine (SEROQUEL) 100 MG tablet Take 1.5 tablets (150 mg total) by mouth at bedtime. 11/30/20   Shanna Cisco, NP  triamcinolone cream (KENALOG) 0.5 % Apply 1 application topically 2 (two) times daily as needed. 08/18/20   [name], MD    Allergies    Bentyl [dicyclomine hcl] and Clarithromycin  Review of Systems   Review of Systems  Cardiovascular: Positive for chest pain (right ribs).  All other systems reviewed and are negative.   Physical Exam Updated Vital Signs BP (!) 115/93   Pulse 81   Temp 98.5 F (36.9 C) (Oral)   Resp (!) 22   Ht 5\' 7"  (1.702 m)   Wt 90.7 kg   SpO2 98%   BMI 31.32 kg/m   Physical Exam Vitals and nursing note reviewed.  Constitutional:      Appearance: She is well-developed and well-nourished.  HENT:     Head: Normocephalic and atraumatic.     Mouth/Throat:     Mouth: Oropharynx is clear and moist.  Eyes:     Extraocular Movements: EOM normal.     Conjunctiva/sclera: Conjunctivae normal.     Pupils: Pupils are equal, round, and reactive to light.  Cardiovascular:     Rate and Rhythm: Normal rate and regular rhythm.     Heart sounds: Normal heart sounds.  Pulmonary:     Effort: Pulmonary effort is normal.     Breath sounds: Normal breath sounds.  Chest:     Comments: Tenderness within right lower anterior ribs, relief when pressure applied to this area, exacerbated with movement/deep breathing Abdominal:     General: Bowel sounds are normal.     Palpations: Abdomen is soft.      Tenderness: There is no abdominal tenderness.     Comments: No tenderness in RUQ, no peritoneal signs, abdomen soft  Musculoskeletal:        General: Normal range of motion.     Cervical back: Normal range of motion.  Skin:    General: Skin is warm and dry.  Neurological:     Mental Status: She is alert and oriented to person, place, and time.     Comments: Awake, alert, moving extremities well  Psychiatric:        Mood and Affect: Mood and affect normal.     Comments: Volatile mood during exam, varying between anger and sobbing     ED Results / Procedures / Treatments   Labs (all labs ordered are listed, but only abnormal results are displayed) Labs Reviewed  CBC WITH DIFFERENTIAL/PLATELET - Abnormal; Notable for the following components:      Result Value   RBC 3.69 (*)    MCV 106.5 (*)    MCH 37.7 (*)    All other components within normal limits  COMPREHENSIVE METABOLIC PANEL - Abnormal; Notable for the following components:   Calcium 8.7 (*)    AST 145 (*)    All other components within normal limits  ETHANOL - Abnormal; Notable for the following components:   Alcohol, Ethyl (B) 200 (*)    All other components within normal limits  RAPID URINE DRUG SCREEN, HOSP PERFORMED - Abnormal; Notable for the following components:   Opiates POSITIVE (*)    Tetrahydrocannabinol POSITIVE (*)    Barbiturates POSITIVE (*)    All other components within normal limits    EKG None  Radiology DG Ribs Unilateral W/Chest Right  Result Date: 01/01/2021 CLINICAL DATA:  Fall 1 month ago, right-sided rib pain EXAM: RIGHT RIBS AND CHEST - 3+ VIEW COMPARISON:  Radiograph 09/26/2015 FINDINGS: Technically challenging exam given patient difficulty with positioning. No consolidation, features of edema, pneumothorax, or effusion. Pulmonary vascularity is normally distributed. The cardiomediastinal contours are unremarkable. No visible acute or healing rib fractures. No suspicious callus  formation. No other acute traumatic findings of the chest. Prior cervical fusion incompletely assessed on this exam. IMPRESSION: No visible acute or healing rib fractures. No acute cardiopulmonary abnormality. Electronically Signed   By: Kreg ShropshirePrice  DeHay M.D.   On: 01/01/2021 23:22    Procedures Procedures   Medications Ordered in ED Medications - No data to display  ED Course  I have reviewed the triage vital signs and the nursing notes.  Pertinent labs & imaging results that were available during my care of the patient were reviewed by me and considered in my medical decision making (see chart for details).    MDM Rules/Calculators/A&P    55 y.o. F here with right right pain for the past 3 months, worse after a fall 1 month ago.  Denies abdominal pain, nausea, vomiting, diarrhea.  No pain with eating/drinking.  She is an alcoholic, drinks heavily daily per family.  On arrival here, she has a very erratic mood varying between agitated and sobbing.  She has no focal deficits and appears oriented to self and situation at hand.  Given her mannerisms here, do have some concern for acute intoxication.  Will obtain labs, x-ray right ribs.  10:38 PM Notified by x-ray that patient is yelling and screaming about wanting to go home since she is not getting pain medications.  She is refusing x-rays and wants to leave.  She was given tylenol for now.  Will try and get x-rays and labs once she calms down.  1:54 AM Patient much more calm at this time.  Labs as above.  Does have elevation of her AST only (nearly 4 fold of ALT), likely due to her chronic alcohol use.  Ethanol was 200.  Her UDS is positive for opiates, THC, and barbiturates.  Rib films without any acute findings.  Patient was reexamined, she continues to have tenderness only to the right lower anterior ribs just beneath breast.  She remains without any abdominal tenderness whatsoever.  I highly doubt this is acute cholecystitis without abdominal  pain.   On chart review, it does appear that she broke several of her right ribs a few years ago while playing volleyball but states she has been fine for years until recently.  She denies any focal breast pain/masses but has not had mammogram in several years.  Does report family history of breast cancer.  She is current uninsured.  Will refer to wellness center to hopefully get her in for full physical and routine screening mammogram/PAP.   Can continue symptomatic.  She is aware of potential risks of long term EtOH use, not willing to quit at this point.  She may return here for any new/acute changes.  Final Clinical Impression(s) / ED Diagnoses Final diagnoses:  Rib pain on right side    Rx / DC Orders ED Discharge Orders    None       Garlon Hatchet, PA-C 01/02/21 0244    Marily Memos, MD 01/02/21 [phone]

## 2021-01-02 LAB — COMPREHENSIVE METABOLIC PANEL
ALT: 38 U/L (ref 0–44)
AST: 145 U/L — ABNORMAL HIGH (ref 15–41)
Albumin: 4.1 g/dL (ref 3.5–5.0)
Alkaline Phosphatase: 71 U/L (ref 38–126)
Anion gap: 14 (ref 5–15)
BUN: 7 mg/dL (ref 6–20)
CO2: 23 mmol/L (ref 22–32)
Calcium: 8.7 mg/dL — ABNORMAL LOW (ref 8.9–10.3)
Chloride: 106 mmol/L (ref 98–111)
Creatinine, Ser: 0.75 mg/dL (ref 0.44–1.00)
GFR, Estimated: >60 mL/min (ref >60)
Glucose, Bld: 90 mg/dL (ref 70–99)
Potassium: 3.5 mmol/L (ref 3.5–5.1)
Sodium: 143 mmol/L (ref 135–145)
Total Bilirubin: 1 mg/dL (ref 0.3–1.2)
Total Protein: 7.3 g/dL (ref 6.5–8.1)

## 2021-01-02 LAB — RAPID URINE DRUG SCREEN, HOSP PERFORMED
Amphetamines: NOT DETECTED
Barbiturates: POSITIVE — AB
Benzodiazepines: NOT DETECTED
Cocaine: NOT DETECTED
Opiates: POSITIVE — AB
Tetrahydrocannabinol: POSITIVE — AB

## 2021-01-02 LAB — CBC WITH DIFFERENTIAL/PLATELET
Abs Immature Granulocytes: 0.01 10*3/uL (ref 0.00–0.07)
Basophils Absolute: 0.1 10*3/uL (ref 0.0–0.1)
Basophils Relative: 1 %
Eosinophils Absolute: 0.1 10*3/uL (ref 0.0–0.5)
Eosinophils Relative: 2 %
HCT: 39.3 % (ref 36.0–46.0)
Hemoglobin: 13.9 g/dL (ref 12.0–15.0)
Immature Granulocytes: 0 %
Lymphocytes Relative: 54 %
Lymphs Abs: 3 10*3/uL (ref 0.7–4.0)
MCH: 37.7 pg — ABNORMAL HIGH (ref 26.0–34.0)
MCHC: 35.4 g/dL (ref 30.0–36.0)
MCV: 106.5 fL — ABNORMAL HIGH (ref 80.0–100.0)
Monocytes Absolute: 0.7 10*3/uL (ref 0.1–1.0)
Monocytes Relative: 13 %
Neutro Abs: 1.7 10*3/uL (ref 1.7–7.7)
Neutrophils Relative %: 30 %
Platelets: 206 10*3/uL (ref 150–400)
RBC: 3.69 MIL/uL — ABNORMAL LOW (ref 3.87–5.11)
RDW: 12.6 % (ref 11.5–15.5)
WBC: 5.6 10*3/uL (ref 4.0–10.5)
nRBC: 0 % (ref 0.0–0.2)

## 2021-01-02 LAB — ETHANOL: Alcohol, Ethyl (B): 200 mg/dL — ABNORMAL HIGH (ref <10)

## 2021-01-02 NOTE — ED Notes (Signed)
Nurse tech found 2 empty "airplane bottles" approx 3-4 oz ofJim Beam,after patient was discharged. These were not present when patient arrived

## 2021-01-02 NOTE — Discharge Instructions (Addendum)
Recommend tylenol or motrin as needed for pain. Follow-up with the wellness clinic-- call in the morning to get this scheduled.  This is the free clinic we talked about. Return to the ED for new or worsening symptoms.

## 2021-02-28 ENCOUNTER — Encounter (HOSPITAL_COMMUNITY): Payer: Self-pay | Admitting: Psychiatry

## 2021-02-28 ENCOUNTER — Other Ambulatory Visit: Payer: Self-pay

## 2021-02-28 ENCOUNTER — Telehealth (INDEPENDENT_AMBULATORY_CARE_PROVIDER_SITE_OTHER): Payer: No Payment, Other | Admitting: Psychiatry

## 2021-02-28 DIAGNOSIS — F3131 Bipolar disorder, current episode depressed, mild: Secondary | ICD-10-CM | POA: Diagnosis not present

## 2021-02-28 MED ORDER — MIRTAZAPINE 45 MG PO TABS: 45.0000 mg | ORAL_TABLET | Freq: Every day | ORAL | 2 refills | Status: DC

## 2021-02-28 MED ORDER — FLUOXETINE HCL 20 MG PO CAPS: 20.0000 mg | ORAL_CAPSULE | Freq: Every day | ORAL | 2 refills | Status: DC

## 2021-02-28 MED ORDER — LAMOTRIGINE 150 MG PO TABS: 150.0000 mg | ORAL_TABLET | Freq: Every day | ORAL | 2 refills | Status: DC

## 2021-02-28 MED ORDER — HYDROXYZINE HCL 10 MG PO TABS: 10.0000 mg | ORAL_TABLET | Freq: Three times a day (TID) | ORAL | 2 refills | Status: DC | PRN

## 2021-02-28 MED ORDER — QUETIAPINE FUMARATE 100 MG PO TABS: 150.0000 mg | ORAL_TABLET | Freq: Every day | ORAL | 2 refills | Status: DC
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
## 2021-05-30 ENCOUNTER — Telehealth (INDEPENDENT_AMBULATORY_CARE_PROVIDER_SITE_OTHER): Payer: No Payment, Other | Admitting: Psychiatry

## 2021-05-30 ENCOUNTER — Other Ambulatory Visit: Payer: Self-pay

## 2021-05-30 ENCOUNTER — Encounter (HOSPITAL_COMMUNITY): Payer: Self-pay | Admitting: Psychiatry

## 2021-05-30 DIAGNOSIS — F5105 Insomnia due to other mental disorder: Secondary | ICD-10-CM

## 2021-05-30 DIAGNOSIS — F99 Mental disorder, not otherwise specified: Secondary | ICD-10-CM | POA: Diagnosis not present

## 2021-05-30 DIAGNOSIS — F3131 Bipolar disorder, current episode depressed, mild: Secondary | ICD-10-CM

## 2021-05-30 MED ORDER — LAMOTRIGINE 150 MG PO TABS: 150.0000 mg | ORAL_TABLET | Freq: Every day | ORAL | 2 refills | Status: DC

## 2021-05-30 MED ORDER — ZOLPIDEM TARTRATE 5 MG PO TABS: 5.0000 mg | ORAL_TABLET | Freq: Every evening | ORAL | 2 refills | Status: DC | PRN

## 2021-05-30 MED ORDER — QUETIAPINE FUMARATE 100 MG PO TABS: 150.0000 mg | ORAL_TABLET | Freq: Every day | ORAL | 2 refills | Status: DC

## 2021-05-30 MED ORDER — FLUOXETINE HCL 20 MG PO CAPS: 20.0000 mg | ORAL_CAPSULE | Freq: Every day | ORAL | 2 refills | Status: DC

## 2021-05-30 MED ORDER — HYDROXYZINE HCL 10 MG PO TABS: 10.0000 mg | ORAL_TABLET | Freq: Three times a day (TID) | ORAL | 2 refills | Status: DC | PRN

## 2021-05-30 MED ORDER — MIRTAZAPINE 45 MG PO TABS: 45.0000 mg | ORAL_TABLET | Freq: Every day | ORAL | 2 refills | Status: DC
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
## 2021-08-17 ENCOUNTER — Encounter: Payer: Self-pay | Admitting: Emergency Medicine

## 2021-08-17 ENCOUNTER — Other Ambulatory Visit: Payer: Self-pay

## 2021-08-17 ENCOUNTER — Inpatient Hospital Stay
Admission: EM | Admit: 2021-08-17 | Discharge: 2021-08-18 | DRG: 872 | Disposition: A | Discharge status: Home / Self Care | Payer: No Typology Code available for payment source | Attending: Internal Medicine | Admitting: Internal Medicine

## 2021-08-17 DIAGNOSIS — F17210 Nicotine dependence, cigarettes, uncomplicated: Secondary | ICD-10-CM | POA: Diagnosis present

## 2021-08-17 DIAGNOSIS — L089 Local infection of the skin and subcutaneous tissue, unspecified: Secondary | ICD-10-CM | POA: Diagnosis present

## 2021-08-17 DIAGNOSIS — Z881 Allergy status to other antibiotic agents status: Secondary | ICD-10-CM

## 2021-08-17 DIAGNOSIS — N3010 Interstitial cystitis (chronic) without hematuria: Secondary | ICD-10-CM | POA: Diagnosis present

## 2021-08-17 DIAGNOSIS — F419 Anxiety disorder, unspecified: Secondary | ICD-10-CM | POA: Diagnosis present

## 2021-08-17 DIAGNOSIS — F4310 Post-traumatic stress disorder, unspecified: Secondary | ICD-10-CM | POA: Diagnosis present

## 2021-08-17 DIAGNOSIS — F319 Bipolar disorder, unspecified: Secondary | ICD-10-CM

## 2021-08-17 DIAGNOSIS — Z79899 Other long term (current) drug therapy: Secondary | ICD-10-CM

## 2021-08-17 DIAGNOSIS — R000 Tachycardia, unspecified: Secondary | ICD-10-CM | POA: Diagnosis present

## 2021-08-17 DIAGNOSIS — I429 Cardiomyopathy, unspecified: Secondary | ICD-10-CM | POA: Diagnosis present

## 2021-08-17 DIAGNOSIS — I5022 Chronic systolic (congestive) heart failure: Secondary | ICD-10-CM | POA: Diagnosis present

## 2021-08-17 DIAGNOSIS — F3112 Bipolar disorder, current episode manic without psychotic features, moderate: Secondary | ICD-10-CM | POA: Diagnosis present

## 2021-08-17 DIAGNOSIS — A419 Sepsis, unspecified organism: Principal | ICD-10-CM | POA: Diagnosis present

## 2021-08-17 DIAGNOSIS — Z8249 Family history of ischemic heart disease and other diseases of the circulatory system: Secondary | ICD-10-CM

## 2021-08-17 DIAGNOSIS — R21 Rash and other nonspecific skin eruption: Secondary | ICD-10-CM | POA: Diagnosis present

## 2021-08-17 DIAGNOSIS — F309 Manic episode, unspecified: Secondary | ICD-10-CM

## 2021-08-17 DIAGNOSIS — M7022 Olecranon bursitis, left elbow: Secondary | ICD-10-CM

## 2021-08-17 DIAGNOSIS — K5900 Constipation, unspecified: Secondary | ICD-10-CM | POA: Diagnosis present

## 2021-08-17 DIAGNOSIS — Z20822 Contact with and (suspected) exposure to covid-19: Secondary | ICD-10-CM | POA: Diagnosis present

## 2021-08-17 DIAGNOSIS — F32A Depression, unspecified: Secondary | ICD-10-CM | POA: Diagnosis present

## 2021-08-17 DIAGNOSIS — G4700 Insomnia, unspecified: Secondary | ICD-10-CM | POA: Diagnosis present

## 2021-08-17 DIAGNOSIS — Z888 Allergy status to other drugs, medicaments and biological substances status: Secondary | ICD-10-CM

## 2021-08-17 DIAGNOSIS — L03114 Cellulitis of left upper limb: Secondary | ICD-10-CM | POA: Diagnosis present

## 2021-08-17 DIAGNOSIS — F1010 Alcohol abuse, uncomplicated: Secondary | ICD-10-CM | POA: Diagnosis present

## 2021-08-17 HISTORY — DX: Bipolar disorder, unspecified: F31.9

## 2021-08-17 LAB — URINALYSIS, COMPLETE (UACMP) WITH MICROSCOPIC
Bacteria, UA: NONE SEEN
Bilirubin Urine: NEGATIVE
Glucose, UA: NEGATIVE mg/dL
Hgb urine dipstick: NEGATIVE
Ketones, ur: NEGATIVE mg/dL
Leukocytes,Ua: NEGATIVE
Nitrite: NEGATIVE
Protein, ur: NEGATIVE mg/dL
Specific Gravity, Urine: 1.008 (ref 1.005–1.030)
pH: 5 (ref 5.0–8.0)

## 2021-08-17 LAB — CBC WITH DIFFERENTIAL/PLATELET
Abs Immature Granulocytes: 0.08 10*3/uL — ABNORMAL HIGH (ref 0.00–0.07)
Basophils Absolute: 0 10*3/uL (ref 0.0–0.1)
Basophils Relative: 0 %
Eosinophils Absolute: 0 10*3/uL (ref 0.0–0.5)
Eosinophils Relative: 0 %
HCT: 39.9 % (ref 36.0–46.0)
Hemoglobin: 14.3 g/dL (ref 12.0–15.0)
Immature Granulocytes: 1 %
Lymphocytes Relative: 14 %
Lymphs Abs: 1.7 10*3/uL (ref 0.7–4.0)
MCH: 36.4 pg — ABNORMAL HIGH (ref 26.0–34.0)
MCHC: 35.8 g/dL (ref 30.0–36.0)
MCV: 101.5 fL — ABNORMAL HIGH (ref 80.0–100.0)
Monocytes Absolute: 0.7 10*3/uL (ref 0.1–1.0)
Monocytes Relative: 6 %
Neutro Abs: 9.9 10*3/uL — ABNORMAL HIGH (ref 1.7–7.7)
Neutrophils Relative %: 79 %
Platelets: 325 10*3/uL (ref 150–400)
RBC: 3.93 MIL/uL (ref 3.87–5.11)
RDW: 13 % (ref 11.5–15.5)
WBC: 12.4 10*3/uL — ABNORMAL HIGH (ref 4.0–10.5)
nRBC: 0 % (ref 0.0–0.2)

## 2021-08-17 LAB — URINE DRUG SCREEN, QUALITATIVE (ARMC ONLY)
Amphetamines, Ur Screen: POSITIVE — AB
Barbiturates, Ur Screen: NOT DETECTED
Benzodiazepine, Ur Scrn: NOT DETECTED
Cannabinoid 50 Ng, Ur ~~LOC~~: POSITIVE — AB
Cocaine Metabolite,Ur ~~LOC~~: NOT DETECTED
MDMA (Ecstasy)Ur Screen: NOT DETECTED
Methadone Scn, Ur: NOT DETECTED
Opiate, Ur Screen: NOT DETECTED
Phencyclidine (PCP) Ur S: NOT DETECTED
Tricyclic, Ur Screen: NOT DETECTED

## 2021-08-17 LAB — COMPREHENSIVE METABOLIC PANEL
ALT: 15 U/L (ref 0–44)
AST: 40 U/L (ref 15–41)
Albumin: 4.3 g/dL (ref 3.5–5.0)
Alkaline Phosphatase: 77 U/L (ref 38–126)
Anion gap: 10 (ref 5–15)
BUN: 12 mg/dL (ref 6–20)
CO2: 25 mmol/L (ref 22–32)
Calcium: 9.7 mg/dL (ref 8.9–10.3)
Chloride: 100 mmol/L (ref 98–111)
Creatinine, Ser: 0.89 mg/dL (ref 0.44–1.00)
GFR, Estimated: >60 mL/min (ref >60)
Glucose, Bld: 122 mg/dL — ABNORMAL HIGH (ref 70–99)
Potassium: 3.9 mmol/L (ref 3.5–5.1)
Sodium: 135 mmol/L (ref 135–145)
Total Bilirubin: 0.9 mg/dL (ref 0.3–1.2)
Total Protein: 7.9 g/dL (ref 6.5–8.1)

## 2021-08-17 LAB — RESP PANEL BY RT-PCR (FLU A&B, COVID) ARPGX2
Influenza A by PCR: NEGATIVE
Influenza B by PCR: NEGATIVE
SARS Coronavirus 2 by RT PCR: NEGATIVE

## 2021-08-17 LAB — BRAIN NATRIURETIC PEPTIDE
B Natriuretic Peptide: 34.9 pg/mL (ref 0.0–100.0)
B Natriuretic Peptide: 70.4 pg/mL (ref 0.0–100.0)

## 2021-08-17 LAB — SEDIMENTATION RATE: Sed Rate: 23 mm/hr (ref 0–30)

## 2021-08-17 LAB — TROPONIN I (HIGH SENSITIVITY)
Troponin I (High Sensitivity): 8 ng/L (ref <18)
Troponin I (High Sensitivity): 8 ng/L (ref <18)

## 2021-08-17 LAB — HIV ANTIBODY (ROUTINE TESTING W REFLEX): HIV Screen 4th Generation wRfx: NONREACTIVE

## 2021-08-17 LAB — LACTIC ACID, PLASMA
Lactic Acid, Venous: 1.9 mmol/L (ref 0.5–1.9)
Lactic Acid, Venous: 1.3 mmol/L (ref 0.5–1.9)

## 2021-08-17 LAB — APTT: aPTT: 25 seconds (ref 24–36)

## 2021-08-17 LAB — PREGNANCY, URINE: Preg Test, Ur: NEGATIVE

## 2021-08-17 LAB — PROTIME-INR
INR: 0.8 (ref 0.8–1.2)
Prothrombin Time: 11.4 seconds (ref 11.4–15.2)

## 2021-08-17 LAB — ACETAMINOPHEN LEVEL: Acetaminophen (Tylenol), Serum: <10 ug/mL — ABNORMAL LOW (ref 10–30)

## 2021-08-17 LAB — PROCALCITONIN: Procalcitonin: <0.1 ng/mL

## 2021-08-17 LAB — POC URINE PREG, ED: Preg Test, Ur: NEGATIVE

## 2021-08-17 LAB — C-REACTIVE PROTEIN: CRP: 3.3 mg/dL — ABNORMAL HIGH (ref <1.0)

## 2021-08-17 MED ORDER — SENNOSIDES-DOCUSATE SODIUM 8.6-50 MG PO TABS
1.0000 | ORAL_TABLET | Freq: Two times a day (BID) | ORAL | Status: DC
  Administered 2021-08-18: 1 via ORAL
  Filled 2021-08-17: qty 1

## 2021-08-17 MED ORDER — OLANZAPINE 5 MG PO TABS
5.0000 mg | ORAL_TABLET | Freq: Two times a day (BID) | ORAL | Status: DC
  Administered 2021-08-17 – 2021-08-18 (×2): 5 mg via ORAL
  Filled 2021-08-17 (×2): qty 1

## 2021-08-17 MED ORDER — NICOTINE 21 MG/24HR TD PT24
21.0000 mg | MEDICATED_PATCH | Freq: Every day | TRANSDERMAL | Status: DC
Start: 2021-08-17 — End: 2021-08-18
  Administered 2021-08-18: 21 mg via TRANSDERMAL
  Filled 2021-08-17: qty 1

## 2021-08-17 MED ORDER — SODIUM CHLORIDE 0.9 % IV SOLN
1.0000 g | Freq: Once | INTRAVENOUS | Status: DC
  Filled 2021-08-17: qty 10

## 2021-08-17 MED ORDER — VANCOMYCIN HCL 750 MG/150ML IV SOLN
750.0000 mg | Freq: Two times a day (BID) | INTRAVENOUS | Status: DC
  Administered 2021-08-18: 750 mg via INTRAVENOUS
  Filled 2021-08-17 (×2): qty 150

## 2021-08-17 MED ORDER — THIAMINE HCL 100 MG PO TABS
100.0000 mg | ORAL_TABLET | Freq: Every day | ORAL | Status: DC
  Administered 2021-08-17 – 2021-08-18 (×2): 100 mg via ORAL
  Filled 2021-08-17 (×2): qty 1

## 2021-08-17 MED ORDER — FOLIC ACID 1 MG PO TABS
1.0000 mg | ORAL_TABLET | Freq: Every day | ORAL | Status: DC
  Administered 2021-08-17 – 2021-08-18 (×2): 1 mg via ORAL
  Filled 2021-08-17 (×2): qty 1

## 2021-08-17 MED ORDER — LORAZEPAM 2 MG/ML IJ SOLN
1.0000 mg | INTRAMUSCULAR | Status: DC | PRN
Start: 2021-08-17 — End: 2021-08-18

## 2021-08-17 MED ORDER — LAMOTRIGINE 25 MG PO TABS
150.0000 mg | ORAL_TABLET | Freq: Every day | ORAL | Status: DC
  Administered 2021-08-18: 150 mg via ORAL
  Filled 2021-08-17: qty 1
  Filled 2021-08-17: qty 6

## 2021-08-17 MED ORDER — VANCOMYCIN HCL IN DEXTROSE 1-5 GM/200ML-% IV SOLN
1000.0000 mg | Freq: Once | INTRAVENOUS | Status: AC
  Administered 2021-08-17: 1000 mg via INTRAVENOUS
  Filled 2021-08-17: qty 200

## 2021-08-17 MED ORDER — SODIUM CHLORIDE 0.9 % IV BOLUS
1000.0000 mL | Freq: Once | INTRAVENOUS | Status: AC
  Administered 2021-08-17: 1000 mL via INTRAVENOUS

## 2021-08-17 MED ORDER — LORAZEPAM 1 MG PO TABS: 1.0000 mg | ORAL_TABLET | ORAL | Status: DC | PRN

## 2021-08-17 MED ORDER — ADULT MULTIVITAMIN W/MINERALS CH
1.0000 | ORAL_TABLET | Freq: Every day | ORAL | Status: DC
  Administered 2021-08-17 – 2021-08-18 (×2): 1 via ORAL
  Filled 2021-08-17 (×2): qty 1

## 2021-08-17 MED ORDER — ASENAPINE MALEATE 5 MG SL SUBL
10.0000 mg | SUBLINGUAL_TABLET | Freq: Once | SUBLINGUAL | Status: AC
  Administered 2021-08-17: 10 mg via SUBLINGUAL
  Filled 2021-08-17: qty 2

## 2021-08-17 MED ORDER — POLYETHYLENE GLYCOL 3350 17 G PO PACK: 17.0000 g | PACK | Freq: Every day | ORAL | Status: DC | PRN

## 2021-08-17 MED ORDER — DIPHENHYDRAMINE HCL 25 MG PO CAPS: 25.0000 mg | ORAL_CAPSULE | Freq: Three times a day (TID) | ORAL | Status: DC | PRN

## 2021-08-17 MED ORDER — FLUOXETINE HCL 20 MG PO CAPS
20.0000 mg | ORAL_CAPSULE | Freq: Every day | ORAL | Status: DC
  Administered 2021-08-17 – 2021-08-18 (×2): 20 mg via ORAL
  Filled 2021-08-17 (×2): qty 1

## 2021-08-17 MED ORDER — SODIUM CHLORIDE 0.9 % IV SOLN
1.0000 g | Freq: Once | INTRAVENOUS | Status: AC
  Administered 2021-08-17: 1 g via INTRAVENOUS
  Filled 2021-08-17: qty 10

## 2021-08-17 MED ORDER — THIAMINE HCL 100 MG/ML IJ SOLN
100.0000 mg | Freq: Every day | INTRAMUSCULAR | Status: DC
  Filled 2021-08-17: qty 2

## 2021-08-17 MED ORDER — HYDROXYZINE HCL 10 MG PO TABS
10.0000 mg | ORAL_TABLET | Freq: Three times a day (TID) | ORAL | Status: DC | PRN
  Administered 2021-08-17: 10 mg via ORAL
  Filled 2021-08-17 (×3): qty 1

## 2021-08-17 MED ORDER — TRIAMCINOLONE ACETONIDE 0.5 % EX CREA
1.0000 "application " | TOPICAL_CREAM | Freq: Two times a day (BID) | CUTANEOUS | Status: DC | PRN
  Filled 2021-08-17: qty 15

## 2021-08-17 MED ORDER — MIRTAZAPINE 15 MG PO TABS: 45.0000 mg | ORAL_TABLET | Freq: Every day | ORAL | Status: DC

## 2021-08-17 MED ORDER — ACETAMINOPHEN 325 MG PO TABS
650.0000 mg | ORAL_TABLET | Freq: Four times a day (QID) | ORAL | Status: DC | PRN
  Administered 2021-08-18: 650 mg via ORAL
  Filled 2021-08-17: qty 2

## 2021-08-17 MED ORDER — ONDANSETRON HCL 4 MG/2ML IJ SOLN: 4.0000 mg | Freq: Three times a day (TID) | INTRAMUSCULAR | Status: DC | PRN

## 2021-08-17 MED ORDER — LORAZEPAM 2 MG/ML IJ SOLN: 0.0000 mg | Freq: Two times a day (BID) | INTRAMUSCULAR | Status: DC

## 2021-08-17 MED ORDER — IBUPROFEN 400 MG PO TABS
400.0000 mg | ORAL_TABLET | Freq: Four times a day (QID) | ORAL | Status: DC | PRN
  Administered 2021-08-17: 400 mg via ORAL
  Filled 2021-08-17: qty 1

## 2021-08-17 MED ORDER — ZOLPIDEM TARTRATE 5 MG PO TABS: 5.0000 mg | ORAL_TABLET | Freq: Every evening | ORAL | Status: DC | PRN

## 2021-08-17 MED ORDER — PREDNISONE 10 MG PO TABS
10.0000 mg | ORAL_TABLET | Freq: Every day | ORAL | Status: DC
  Administered 2021-08-18: 10 mg via ORAL
  Filled 2021-08-17: qty 1

## 2021-08-17 MED ORDER — LORAZEPAM 2 MG/ML IJ SOLN
0.0000 mg | Freq: Four times a day (QID) | INTRAMUSCULAR | Status: DC
  Administered 2021-08-17: 4 mg via INTRAVENOUS
  Filled 2021-08-17: qty 2

## 2021-08-17 MED ORDER — HEPARIN SODIUM (PORCINE) 5000 UNIT/ML IJ SOLN: 5000.0000 [IU] | Freq: Three times a day (TID) | INTRAMUSCULAR | Status: DC

## 2021-08-17 NOTE — ED Triage Notes (Addendum)
Pt to ED via POV, pt states that she was seen last week at urgent care and diagnosed with staph infection and was given antibiotics. Pt states that she went back yesterday and the doctor told her she needed to come to the ER to be evaluated for kidney issues.   Pt has pressured and rapid speech in triage. Pt reports hx/o Bipolar  Pt is in NAD at this time.

## 2021-08-17 NOTE — BH Assessment (Signed)
Comprehensive Clinical Assessment (CCA) Note  08/17/2021 Tricia [name] Tricia [name] [id number]  Chief Complaint:  Chief Complaint  Patient presents with   Rash   Visit Diagnosis: Bipolar  Tricia [name] is a 55 year old female who presents to the ER due to manic behaviors. Per report giving to ER staff, patient was giving medications and it resulted in her having a bad reactions that caused the the symptoms of her Bipolar to manifest. When walking in the room, patient was talking and saying things that were logical. However, no one was talking with her at the time. She was talking and stating different factual information about her life. She states she hasn't slept in three days.  During the interview the patient attempted answer the questions but was hyper-verbal and was talking about things that would relate to the question but didn't answer the question. She was polite an cooperative. As well as fidgety and restless.   CCA Screening, Triage and Referral (STR)  Patient Reported Information How did you hear about [place]? No data recorded What Is the Reason for Your Visit/Call Today? No data recorded How Long Has This Been Causing You Problems? No data recorded What Do You Feel Would Help You the Most Today? No data recorded  Have You Recently Had Any Thoughts About Hurting Yourself? No data recorded Are You Planning to Commit Suicide/Harm Yourself At This time? No data recorded  Have you Recently Had Thoughts About Hurting Someone Tricia [name]? No data recorded Are You Planning to Harm Someone at This Time? No data recorded Explanation: No data recorded  Have You Used Any Alcohol or Drugs in the Past 24 Hours? No data recorded How Long Ago Did You Use Drugs or Alcohol? No data recorded What Did You Use and How Much? No data recorded  Do You Currently Have a Therapist/Psychiatrist? No data recorded Name of Therapist/Psychiatrist: No data recorded  Have You Been Recently Discharged From Any Office  Practice or Programs? No data recorded Explanation of Discharge From Practice/Program: No data recorded    CCA Screening Triage Referral Assessment Type of Contact: No data recorded Telemedicine Service Delivery:   Is this Initial or Reassessment? No data recorded Date Telepsych consult ordered in [facility]:  No data recorded Time Telepsych consult ordered in [facility]:  No data recorded Location of Assessment: No data recorded Provider Location: No data recorded  Collateral Involvement: No data recorded  Does Patient Have a Court Appointed Legal Guardian? No data recorded Name and Contact of Legal Guardian: No data recorded If Minor and Not Living with Parent(s), Who has Custody? No data recorded Is CPS involved or ever been involved? No data recorded Is APS involved or ever been involved? No data recorded  Patient Determined To Be At Risk for Harm To Self or Others Based on Review of Patient Reported Information or Presenting Complaint? No data recorded Method: No data recorded Availability of Means: No data recorded Intent: No data recorded Notification Required: No data recorded Additional Information for Danger to Others Potential: No data recorded Additional Comments for Danger to Others Potential: No data recorded Are There Guns or Other Weapons in Your Home? No data recorded Types of Guns/Weapons: No data recorded Are These Weapons Safely Secured?                            No data recorded Who Could Verify You Are Able To Have These Secured: No data recorded Do You  Have any Outstanding Charges, Pending Court Dates, Parole/Probation? No data recorded Contacted To Inform of Risk of Harm To Self or Others: No data recorded   Does Patient Present under Involuntary Commitment? No data recorded IVC Papers Initial File Date: No data recorded  [place] of Residence: No data recorded  Patient Currently Receiving the Following Services: No data recorded  Determination of Need: No data  recorded  Options For Referral: No data recorded    CCA Biopsychosocial Patient Reported Schizophrenia/Schizoaffective Diagnosis in Past: No   Strengths: No data recorded  Mental Health Symptoms Depression:   Change in energy/activity; Sleep (too much or little)   Duration of Depressive symptoms:    Mania:   Racing thoughts; Overconfidence; Recklessness; Euphoria; Increased Energy   Anxiety:    Restlessness; Worrying; Fatigue   Psychosis:   None   Duration of Psychotic symptoms:    Trauma:   None   Obsessions:   None   Compulsions:   "Driven" to perform behaviors/acts   Inattention:   N/A   Hyperactivity/Impulsivity:   Blurts out answers; Difficulty waiting turn; Feeling of restlessness; Fidgets with hands/feet; Talks excessively; Several symptoms present in 2 of more settings   Oppositional/Defiant Behaviors:   N/A   Emotional Irregularity:   N/A   Other Mood/Personality Symptoms:  No data recorded   Mental Status Exam Appearance and self-care  Stature:   Average   Weight:   Average weight   Clothing:   Neat/clean; Age-appropriate   Grooming:   Normal   Cosmetic use:   None   Posture/gait:   Normal   Motor activity:   Repetitive   Sensorium  Attention:   Normal   Concentration:   Normal   Orientation:   X5   Recall/memory:   Normal   Affect and Mood  Affect:   Other (Comment)   Mood:   Anxious   Relating  Eye contact:   Fleeting   Facial expression:   Anxious; Responsive   Attitude toward examiner:   Cooperative   Thought and Language  Speech flow:  Flight of Ideas   Thought content:   Appropriate to Mood and Circumstances   Preoccupation:   Other (Comment)   Hallucinations:   None   Organization:  No data recorded  [employer] of Knowledge:   Fair   Intelligence:   Average   Abstraction:   Abstract   Judgement:   Poor   Reality Testing:   Distorted   Insight:   Flashes  of insight; Poor   Decision Making:   Impulsive   Social Functioning  Social Maturity:   Impulsive   Social Judgement:   Naive   Stress  Stressors:   Other (Comment)   Coping Ability:   Normal; Exhausted   Skill Deficits:   None   Supports:   Family; Friends/Service system     Religion: Religion/Spirituality Are You A Religious Person?: No  Leisure/Recreation: Leisure / Recreation Do You Have Hobbies?: No  Exercise/Diet: Exercise/Diet Do You Exercise?: No Have You Gained or Lost A Significant Amount of Weight in the Past Six Months?: No Do You Follow a Special Diet?: No Do You Have Any Trouble Sleeping?: Yes Explanation of Sleeping Difficulties: She states she hasn't slept in three days.   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?:  No  Education: Education Did You Have An Individualized Education Program (IIEP): No Did You Have Any Difficulty At School?: No Patient's Education Has Been Impacted by Current Illness: No   CCA Family/Childhood History Family and Relationship History: Family history Marital status: Single Does patient have children?: No  Childhood History:  Childhood History Did patient suffer any verbal/emotional/physical/sexual abuse as a child?:  (Unable to assess.) Did patient suffer from severe childhood neglect?:  (Unable to assess.) Has patient ever been sexually abused/assaulted/raped as an adolescent or adult?:  (Unable to assess.) Was the patient ever a victim of a crime or a disaster?:  (Unable to assess.) Witnessed domestic violence?:  (Unable to assess.) Has patient been affected by domestic violence as an adult?:  (Unable to assess.)  Child/Adolescent Assessment:     CCA Substance Use Alcohol/Drug Use: Alcohol /  Drug Use Pain Medications: See PTA Prescriptions: See PTA Over the Counter: See PTA History of alcohol / drug use?: No history of alcohol / drug abuse Longest period of sobriety (when/how long): n/a  ASAM's:  Six Dimensions of Multidimensional Assessment  Dimension 1:  Acute Intoxication and/or Withdrawal Potential:      Dimension 2:  Biomedical Conditions and Complications:      Dimension 3:  Emotional, Behavioral, or Cognitive Conditions and Complications:     Dimension 4:  Readiness to Change:     Dimension 5:  Relapse, Continued use, or Continued Problem Potential:     Dimension 6:  Recovery/Living Environment:     ASAM Severity Score:    ASAM Recommended Level of Treatment:     Substance use Disorder (SUD)    Recommendations for Services/Supports/Treatments:    Discharge Disposition:    DSM5 Diagnoses: Patient Active Problem List   Diagnosis Date Noted   Bipolar affective disorder, manic, moderate (HCC) 08/17/2021   Unilateral hearing loss 08/23/2020   Smoker 2-3 cpd 06/12/2020   PTSD (post-traumatic stress disorder) 07/31/2015   CARDIOMYOPATHY 04/06/2010     Referrals to Alternative Service(s): Referred to Alternative Service(s):   Place:   Date:   Time:    Referred to Alternative Service(s):   Place:   Date:   Time:    Referred to Alternative Service(s):   Place:   Date:   Time:    Referred to Alternative Service(s):   Place:   Date:   Time:     Lilyan Gilford MS, LCAS, [facility], [facility] Therapeutic Triage Specialist 08/17/2021 1:58 PM
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
## 2021-08-17 NOTE — ED Notes (Signed)
Amber RN aware of assigned bed 

## 2021-08-17 NOTE — ED Triage Notes (Signed)
First Nurse Note:  Sent to ED from Urgent Care yesterday. C/O pain all over, back pain, and skin staph infection.  Patient is AAOx3.  Skin warm and dry. NAD

## 2021-08-17 NOTE — Consult Note (Signed)
Pharmacy Antibiotic Note  Tricia [name] is a 55 y.o. female admitted on 08/17/2021 with cellulitis.  Pharmacy has been consulted for vancomycin dosing.  Plan: Vancomycin 2000 mg IV loading dose, followed by 750 mg IV q12h Goal AUC 400-550  Est AUC: 527.3 Est Cmax: 31.3 Est Cmin: 15.7 Calculated with SCr 0.89, Vd 0.5  Monitor clinical picture, renal function, and vancomycin levels at steady state F/U C&S, abx deescalation / LOT   Height: 5\' 7"  (170.2 cm) Weight: 90.7 kg (199 lb 15.3 oz) IBW/kg (Calculated) : 61.6  Temp (24hrs), Avg:98.4 F (36.9 C), Min:98.4 F (36.9 C), Max:98.4 F (36.9 C)  Recent Labs  Lab 08/17/21 1009 08/17/21 1057  WBC 12.4*  --   CREATININE 0.89  --   LATICACIDVEN 1.9 1.3    Estimated Creatinine Clearance: 83.5 mL/min (by C-G formula based on SCr of 0.89 mg/dL).    Allergies  Allergen Reactions   Bentyl [Dicyclomine Hcl]     tingling   Clarithromycin     REACTION: nausea and vomiting    Antimicrobials this admission: 10/7 ceftriaxone x 1 10/7 vancomycin >>   Dose adjustments this admission: N/A  Microbiology results: 10/7 BCx: Pending 10/7 Wound Cx: Pending  Thank you for allowing pharmacy to be a part of this patient's care.  12/7, PharmD 08/17/2021 4:49 PM

## 2021-08-17 NOTE — H&P (Addendum)
History and Physical    Tricia [name] NIR:[id number] DOB: 01-Jun-1966 DOA: 08/17/2021  Referring MD/NP/PA:   PCP: [name], MD   Patient coming from:  The patient is coming from home.  At baseline, pt is independent for most of ADL.        Chief Complaint: left elbow pain, insomina  HPI: Tricia [name] is a 55 y.o. female with medical history significant of depression, anxiety, bipolar, PTSD, mania, PVC, IBS, interstitial cystitis, angioedema, tobacco abuse, alcohol abuse, sCHF with EF of 45-50%, who presents with left elbow pain, insomnia.  Pt has mania, keep talking, but not focused, history is very limited. Pt states she has itchy rashes in her abdominal [place] for about 3 months. She was seen in UC for rash and started on prednisone 20 mg daily on 10/6. Pt states that her itchiness has improved after started taking prednisone. She also complains of left elbow pain. Her left elbow is red and swelling.  Patient denies fever or chills. Per report, pt was given prescription for Bactrim without significant help. Patient does not have chest pain, cough, shortness breath.  Patient states that she has been constipated for several days, but no nausea, vomiting or abdominal pain.  No symptoms of UTI. Her mother is concerning for possible suicidal ideation, but pt denies SI or HI to me. Pt states that she has not been able to sleep for the last 3 nights.    ED Course: pt was found to have WBC 12.4, lactic acid of 1.9, 1.3, troponin level 8, 8, negative pregnancy test, negative COVID PCR, Tylenol level less than 10, negative urinalysis, electrolytes renal function okay, temperature normal, blood pressure 139/103, heart rate 120, RR 26, oxygen saturation 96% on room air.  Patient is admitted to MedSurg bed as inpatient.  Psychiatry and TTS are consulted. Ortho is consulted. Pt is IVC'ed.  Review of Systems:   General: no fevers, chills, no body weight gain, has fatigue HEENT: no blurry vision,  hearing changes or sore throat Respiratory: no dyspnea, coughing, wheezing CV: no chest pain, no palpitations GI: no nausea, vomiting, abdominal pain, diarrhea, has constipation GU: no dysuria, burning on urination, increased urinary frequency, hematuria  Ext: no leg edema Neuro: no unilateral weakness, numbness, or tingling, no vision change or hearing loss Skin: has rash in abdominal [place].  Has pain and swelling in left elbow MSK: No muscle spasm, no deformity, no limitation of range of movement in spin Heme: No easy bruising.  Travel history: No recent long distant travel. Psychiatry: Patient has insomnia, mania   Allergy:  Allergies  Allergen Reactions   Bentyl [Dicyclomine Hcl]     tingling   Clarithromycin     REACTION: nausea and vomiting    Past Medical History:  Diagnosis Date   Abnormal MRI, cervical spine 08/2010   with uncovertebral disease on the left at C6-7 encroaching on the left C7 root.   Angioedema 12/2012   ? food allergy - ED eval   Anxiety    Panic   Arrhythmia    Bipolar 1 disorder (HCC)    Cardiomyopathy    EF 45% by myoview, 45-50% by echo, 50% by MRI. ? PVC-associated cardiomyopathy   Chest pain    ETT myoview 4/11) 6'15", frequent PVCs and run NSVT, mild global HK with EF 45%, fixed anterior defect was likely soft tissue attenuation so no evidence for ischemia or infarction.   Depression    suicidal thoughts 2013, with  BH eval   Herpes simplex virus (HSV) infection    15 years ago   Insomnia    Interstitial cystitis    per [facility]   Irritable bowel syndrome with diarrhea    Premature ventricular contractions    resolved with metoprolol   Toe fracture, right 11/04/2011   2,3rd toes   Vaginal Pap smear, abnormal    Dysplasia 1994    Past Surgical History:  Procedure Laterality Date   BACK SURGERY     C6-7 fusion  2011   Dr. [name]   COLONOSCOPY  05/07/2013   CYSTO WITH HYDRODISTENSION  11/29/2011   Procedure:  CYSTOSCOPY/HYDRODISTENSION;  Surgeon: Kathi Ludwig, MD;  Location: [facility];  Service: Urology;  Laterality: N/A;  M & P    GANGLION CYST EXCISION  2011   Dr. Teressa Senter    Social History:  reports that she has been smoking cigarettes. She has been smoking an average of .5 packs per day. She has never used smokeless tobacco. She reports current alcohol use of about 6.0 standard drinks per week. She reports that she does not use drugs.  Family History:  Family History  Problem Relation Age of Onset   Heart disease Maternal Grandfather 21       CABG   Heart disease Cousin        Aortic dissection   Alcohol abuse Cousin    Alcohol abuse Maternal Uncle    Alcohol abuse Paternal Uncle    Alcohol abuse Paternal Grandfather    Cancer Maternal Grandmother        Ovarian     Prior to Admission medications   Medication Sig Start Date End Date Taking? Authorizing Provider  hydrOXYzine (ATARAX/VISTARIL) 10 MG tablet Take 1 tablet (10 mg total) by mouth 3 (three) times daily as needed. 05/30/21  Yes Toy Cookey E, NP  ibuprofen (ADVIL) 200 MG tablet Take 800 mg by mouth as needed.   Yes [provider]  lamoTRIgine (LAMICTAL) 150 MG tablet Take 1 tablet (150 mg total) by mouth daily. 05/30/21  Yes Toy Cookey E, NP  mirtazapine (REMERON) 45 MG tablet Take 1 tablet (45 mg total) by mouth at bedtime. 05/30/21  Yes Toy Cookey E, NP  predniSONE (DELTASONE) 20 MG tablet Take 20 mg by mouth daily. 08/16/21  Yes [provider]  QUEtiapine (SEROQUEL) 100 MG tablet Take 1.5 tablets (150 mg total) by mouth at bedtime. 05/30/21  Yes Toy Cookey E, NP  triamcinolone cream (KENALOG) 0.5 % Apply 1 application topically 2 (two) times daily as needed. 08/18/20  Yes [name], MD  zolpidem (AMBIEN) 5 MG tablet Take 1 tablet (5 mg total) by mouth at bedtime as needed for sleep. Patient taking differently: Take 2.5 mg by mouth at bedtime as needed  for sleep. 05/30/21  Yes Toy Cookey E, NP  FLUoxetine (PROZAC) 20 MG capsule Take 1 capsule (20 mg total) by mouth daily. Patient not taking: Reported on 08/17/2021 05/30/21   Shanna Cisco, NP    Physical Exam: Vitals:   08/17/21 1030 08/17/21 1130 08/17/21 1200 08/17/21 1308  BP: (!) 135/98 (!) 130/95 132/86 (!) 139/103  Pulse: (!) 111   (!) 107  Resp: 16 (!) 25 16 (!) 26  Temp:      TempSrc:      SpO2: 97%   96%  Weight:      Height:       General: Not in acute distress HEENT:  Eyes: PERRL, EOMI, no scleral icterus.       ENT: No discharge from the ears and nose, no pharynx injection, no tonsillar enlargement.        Neck: No JVD, no bruit, no mass felt. Heme: No neck lymph node enlargement. Cardiac: S1/S2, RRR, No murmurs, No gallops or rubs. Respiratory: No rales, wheezing, rhonchi or rubs. GI: Soft, nondistended, nontender, no rebound pain, no organomegaly, BS present. GU: No hematuria Ext: No pitting leg edema bilaterally. 1+DP/PT pulse bilaterally. H Musculoskeletal: Has swelling, tenderness, warmth, and erythema around in left elbow    Skin: has erythematous rashes mainly in abdominal [place]. Neuro: Alert, oriented X3, cranial nerves II-XII grossly intact, moves all extremities normally. Psych: Psychiatry: Patient has insomnia, mania, no suicidal or hemocidal ideation.  Labs on Admission: I have personally reviewed following labs and imaging studies  CBC: Recent Labs  Lab 08/17/21 1009  WBC 12.4*  NEUTROABS 9.9*  HGB 14.3  HCT 39.9  MCV 101.5*  PLT 638   Basic Metabolic Panel: Recent Labs  Lab 08/17/21 1009  NA 135  K 3.9  CL 100  CO2 25  GLUCOSE 122*  BUN 12  CREATININE 0.89  CALCIUM 9.7   GFR: Estimated Creatinine Clearance: 83.5 mL/min (by C-G formula based on SCr of 0.89 mg/dL). Liver Function Tests: Recent Labs  Lab 08/17/21 1009  AST 40  ALT 15  ALKPHOS 77  BILITOT 0.9  PROT 7.9  ALBUMIN 4.3   No results for  input(s): LIPASE, AMYLASE in the last 168 hours. No results for input(s): AMMONIA in the last 168 hours. Coagulation Profile: No results for input(s): INR, PROTIME in the last 168 hours. Cardiac Enzymes: No results for input(s): CKTOTAL, CKMB, CKMBINDEX, TROPONINI in the last 168 hours. BNP (last 3 results) No results for input(s): PROBNP in the last 8760 hours. HbA1C: No results for input(s): HGBA1C in the last 72 hours. CBG: No results for input(s): GLUCAP in the last 168 hours. Lipid Profile: No results for input(s): CHOL, HDL, LDLCALC, TRIG, CHOLHDL, LDLDIRECT in the last 72 hours. Thyroid Function Tests: No results for input(s): TSH, T4TOTAL, FREET4, T3FREE, THYROIDAB in the last 72 hours. Anemia Panel: No results for input(s): VITAMINB12, FOLATE, FERRITIN, TIBC, IRON, RETICCTPCT in the last 72 hours. Urine analysis:    Component Value Date/Time   COLORURINE YELLOW (A) 08/17/2021 1009   APPEARANCEUR CLEAR (A) 08/17/2021 1009   LABSPEC 1.008 08/17/2021 1009   PHURINE 5.0 08/17/2021 1009   GLUCOSEU NEGATIVE 08/17/2021 1009   [place] 08/17/2021 1009   HGBUR small 06/05/2010 1530   BILIRUBINUR NEGATIVE 08/17/2021 1009   BILIRUBINUR 1 06/27/2015 1419   KETONESUR NEGATIVE 08/17/2021 1009   PROTEINUR NEGATIVE 08/17/2021 1009   UROBILINOGEN 4.0 06/27/2015 1419   UROBILINOGEN 1.0 08/22/2010 0859   NITRITE NEGATIVE 08/17/2021 1009   LEUKOCYTESUR NEGATIVE 08/17/2021 1009   Sepsis Labs: $[id number]'[email or web address]'[id number]$ (procalcitonin:4,lacticidven:4) ) Recent Results (from the past 240 hour(s))  Aerobic Culture w Gram Stain (superficial specimen)     Status: None (Preliminary result)   Collection Time: 08/17/21 10:28 AM   Specimen: Wound  Result Value Ref Range Status   Specimen Description   Final    WOUND Performed at [facility], [address] [place], [place] [address]    Special Requests   Final    LA Performed at [facility], [address] [place],  [place] [address]    Gram Stain   Final    RARE WBC PRESENT, PREDOMINANTLY PMN  NO ORGANISMS SEEN Performed at [facility], [place] [address] [place], [place] [address]    Culture PENDING  Incomplete   Report Status PENDING  Incomplete  Resp Panel by RT-PCR (Flu A&B, Covid)     Status: None   Collection Time: 08/17/21 10:57 AM   Specimen: Nasopharyngeal(NP) swabs in vial transport medium  Result Value Ref Range Status   SARS Coronavirus 2 by RT PCR NEGATIVE NEGATIVE Final    Comment: (NOTE) SARS-CoV-2 target nucleic acids are NOT DETECTED.  The SARS-CoV-2 RNA is generally detectable in upper respiratory specimens during the acute phase of infection. The lowest concentration of SARS-CoV-2 viral copies this assay can detect is 138 copies/mL. A negative result does not preclude SARS-Cov-2 infection and should not be used as the sole basis for treatment or other patient management decisions. A negative result may occur with  improper specimen collection/handling, submission of specimen other than nasopharyngeal swab, presence of viral mutation(s) within the areas targeted by this assay, and inadequate number of viral copies(<138 copies/mL). A negative result must be combined with clinical observations, patient history, and epidemiological information. The expected result is Negative.  Fact Sheet for Patients:  EntrepreneurPulse.com.au  Fact Sheet for Healthcare Providers:  IncredibleEmployment.be  This test is no t yet approved or cleared by the [place] FDA and  has been authorized for detection and/or diagnosis of SARS-CoV-2 by FDA under an Emergency Use Authorization (EUA). This EUA will remain  in effect (meaning this test can be used) for the duration of the COVID-19 declaration under Section 564(b)(1) of the Act, 21 U.S.C.section 360bbb-3(b)(1), unless the authorization is terminated  or revoked sooner.       Influenza A by PCR  NEGATIVE NEGATIVE Final   Influenza B by PCR NEGATIVE NEGATIVE Final    Comment: (NOTE) The Xpert Xpress SARS-CoV-2/FLU/RSV plus assay is intended as an aid in the diagnosis of influenza from Nasopharyngeal swab specimens and should not be used as a sole basis for treatment. Nasal washings and aspirates are unacceptable for Xpert Xpress SARS-CoV-2/FLU/RSV testing.  Fact Sheet for Patients: EntrepreneurPulse.com.au  Fact Sheet for Healthcare Providers: IncredibleEmployment.be  This test is not yet approved or cleared by the [place] FDA and has been authorized for detection and/or diagnosis of SARS-CoV-2 by FDA under an Emergency Use Authorization (EUA). This EUA will remain in effect (meaning this test can be used) for the duration of the COVID-19 declaration under Section 564(b)(1) of the Act, 21 U.S.C. section 360bbb-3(b)(1), unless the authorization is terminated or revoked.  Performed at [facility], [address], [place],  [address]      Radiological Exams on Admission: No results found.   EKG: I have personally reviewed.  Sinus rhythm, QTC 477, LAE, mild T wave inversion in V1-2.  Assessment/Plan Principal Problem:   Cellulitis of left elbow Active Problems:   Insomnia   Bipolar affective disorder, manic, moderate (HCC)   Anxiety   Depression   Chronic systolic CHF (congestive heart failure) (HCC)   Sepsis (HCC)   Alcohol abuse   Rash   Sepsis due to cellulitis of left elbow: Patient meets criteria for sepsis with WBC 12.4, tachycardia with heart rate 120, RR 26.  Lactic acid is normal, 1.9 --> 1.3.  Currently hemodynamically stable. Dr. Sabra Heck of ortho is consulted, will see pt in AM.  - Admitted to [place] bed as inpatient - Empiric antimicrobial treatment with vancomycin, Rocephin - PRN Zofran for nausea,Tylenol and ibuprofen for pain -  Blood cultures x 2  - ESR and CRP - wound care consult -  will get Procalcitonin - IVF: 1.5 L of NS bolus (patient has congestive heart failure, limiting aggressive IV fluids treatment).  Bipolar affective disorder, manic, moderate, and depression with anxiety: consulted psychiatry, NP Waylan Boga.  -Highly appreciated psychiatry recommendation -Lamictal 150 mg daily, prozac 20 mg daily,  -Saphris 10 mg once to assist mood and sleep -check UDS   Insomnia: -Remeron 45 mg daily at bedtime -Ambien 5 mg daily at bedtime  Chronic systolic CHF (congestive heart failure) ([place]): 2D echo on 02/09/2010 showed EF of 45 to 50%.  Patient does not have leg edema, no JVD, no respiratory distress or shortness of breath.  CHF seem to be compensated. -Check BNP  Tobacco abuse and Alcohol abuse: -Nicotine patch -CIWA protocol  Rash: Etiology is not clear.  Possibly due to allergic reaction to something -decrease prednisone dose from 20 to 10 mg daily -Started Benadryl 25 mg as needed   DVT ppx: SQ Heparin    Code Status: Full code Family Communication:  Yes, patient's mother by phone Disposition Plan:  Anticipate discharge back to previous environment Consults called:  NP, Waylan Boga of psychiatry Admission status and Level of care: Med-Surg:     as inpt            Status is: Inpatient  Remains inpatient appropriate because:Inpatient level of care appropriate due to severity of illness  Dispo: The patient is from: Home              Anticipated d/c is to:  to be determined              Patient currently is not medically stable to d/c.   Difficult to place patient No           Date of Service 08/17/2021    Ivor Costa [facility]   If 7PM-7AM, please contact night-coverage [email or web address] 08/17/2021, 4:58 PM

## 2021-08-17 NOTE — ED Notes (Signed)
IVC  CONSULT  DONE  PENDING  PLACEMENT 

## 2021-08-17 NOTE — ED Notes (Signed)
Patients medications from home given to her mother to take home

## 2021-08-17 NOTE — ED Provider Notes (Signed)
[facility] Emergency Department Provider Note   ____________________________________________   Event Date/Time   First MD Initiated Contact with Patient 08/17/21 [phone]     (approximate)  I have reviewed the triage vital signs and the nursing notes.   HISTORY  Chief Complaint Rash   HPI Tricia [name] is a 55 y.o. female who says she was told by the urgent care doctor to come here couple days ago.  She did not do it though.  She is complaining of a rash on her belly.  She also says she had a big lump on her elbow.  She said that the urgent care doctor was worried about her kidneys.  She reports a history of mania.  She says she has not been able to sleep for the last 3 nights.  She is talking nonstop and has some tangential thinking and some flight of ideas.  She appears to be having a manic episode now.         Past Medical History:  Diagnosis Date   Abnormal MRI, cervical spine 08/2010   with uncovertebral disease on the left at C6-7 encroaching on the left C7 root.   Angioedema 12/2012   ? food allergy - ED eval   Anxiety    Panic   Arrhythmia    Bipolar 1 disorder (HCC)    Cardiomyopathy    EF 45% by myoview, 45-50% by echo, 50% by MRI. ? PVC-associated cardiomyopathy   Chest pain    ETT myoview 4/11) 6'15", frequent PVCs and run NSVT, mild global HK with EF 45%, fixed anterior defect was likely soft tissue attenuation so no evidence for ischemia or infarction.   Depression    suicidal thoughts 2013, with [facility] eval   Herpes simplex virus (HSV) infection    15 years ago   Insomnia    Interstitial cystitis    per [facility]   Irritable bowel syndrome with diarrhea    Premature ventricular contractions    resolved with metoprolol   Toe fracture, right 11/04/2011   2,3rd toes   Vaginal Pap smear, abnormal    Dysplasia 1994    Patient Active Problem List   Diagnosis Date Noted   Bipolar affective disorder, manic, moderate (HCC)  08/17/2021   Unilateral hearing loss 08/23/2020   Smoker 2-3 cpd 06/12/2020   PTSD (post-traumatic stress disorder) 07/31/2015   CARDIOMYOPATHY 04/06/2010    Past Surgical History:  Procedure Laterality Date   BACK SURGERY     C6-7 fusion  2011   Dr. [name]   COLONOSCOPY  05/07/2013   CYSTO WITH HYDRODISTENSION  11/29/2011   Procedure: CYSTOSCOPY/HYDRODISTENSION;  Surgeon: Kathi Ludwig, MD;  Location: [facility];  Service: Urology;  Laterality: N/A;  M & P    GANGLION CYST EXCISION  2011   Dr. Teressa Senter    Prior to Admission medications   Medication Sig Start Date End Date Taking? Authorizing Provider  hydrOXYzine (ATARAX/VISTARIL) 10 MG tablet Take 1 tablet (10 mg total) by mouth 3 (three) times daily as needed. 05/30/21  Yes Toy Cookey E, NP  ibuprofen (ADVIL) 200 MG tablet Take 800 mg by mouth as needed.   Yes [provider]  lamoTRIgine (LAMICTAL) 150 MG tablet Take 1 tablet (150 mg total) by mouth daily. 05/30/21  Yes Toy Cookey E, NP  mirtazapine (REMERON) 45 MG tablet Take 1 tablet (45 mg total) by mouth at bedtime. 05/30/21  Yes Shanna Cisco, NP  predniSONE (  DELTASONE) 20 MG tablet Take 20 mg by mouth daily. 08/16/21  Yes [provider]  QUEtiapine (SEROQUEL) 100 MG tablet Take 1.5 tablets (150 mg total) by mouth at bedtime. 05/30/21  Yes Toy Cookey E, NP  triamcinolone cream (KENALOG) 0.5 % Apply 1 application topically 2 (two) times daily as needed. 08/18/20  Yes [name], MD  zolpidem (AMBIEN) 5 MG tablet Take 1 tablet (5 mg total) by mouth at bedtime as needed for sleep. Patient taking differently: Take 2.5 mg by mouth at bedtime as needed for sleep. 05/30/21  Yes Toy Cookey E, NP  FLUoxetine (PROZAC) 20 MG capsule Take 1 capsule (20 mg total) by mouth daily. Patient not taking: Reported on 08/17/2021 05/30/21   Shanna Cisco, NP    Allergies Bentyl [dicyclomine hcl] and  Clarithromycin  Family History  Problem Relation Age of Onset   Heart disease Maternal Grandfather 47       CABG   Heart disease Cousin        Aortic dissection   Alcohol abuse Cousin    Alcohol abuse Maternal Uncle    Alcohol abuse Paternal Uncle    Alcohol abuse Paternal Grandfather    Cancer Maternal Grandmother        Ovarian    Social History Social History   Tobacco Use   Smoking status: Some Days    Packs/day: 0.50    Types: Cigarettes   Smokeless tobacco: Never  Substance Use Topics   Alcohol use: Yes    Alcohol/week: 6.0 standard drinks    Types: 4 Shots of liquor, 2 Glasses of wine per week   Drug use: No    Review of Systems  Constitutional: No fever/chills Eyes: No visual changes. ENT: No sore throat. Cardiovascular: Denies chest pain. Respiratory: Denies shortness of breath. Gastrointestinal: No abdominal pain.  No nausea, no vomiting.  No diarrhea.  No constipation. Genitourinary: Negative for dysuria. Musculoskeletal: Negative for back pain. Skin: rash. Neurological: Negative for headaches, focal weakness   ____________________________________________   PHYSICAL EXAM:  VITAL SIGNS: ED Triage Vitals  Enc Vitals Group     BP 08/17/21 0839 (!) 156/100     Pulse Rate 08/17/21 0839 (!) 120     Resp 08/17/21 0839 18     Temp 08/17/21 0844 98.4 F (36.9 C)     Temp Source 08/17/21 0844 Oral     SpO2 08/17/21 0839 99 %     Weight 08/17/21 0844 199 lb 15.3 oz (90.7 kg)     Height 08/17/21 0844 5\' 7"  (1.702 m)     Head Circumference --      Peak Flow --      Pain Score 08/17/21 0843 8     Pain Loc --      Pain Edu? --      Excl. in GC? --    Constitutional: Alert and oriented. Well appearing and in no acute distress.  She is in constant movement.  She is not stopping to talk. Eyes: Conjunctivae are normal. PERRL. EOMI. Head: Atraumatic. Nose: No congestion/rhinnorhea. Mouth/Throat: Mucous membranes are moist.  Oropharynx  non-erythematous. Neck: No stridor.  Cardiovascular: Normal rate, regular rhythm. Grossly normal heart sounds.   Respiratory: Normal respiratory effort.  No retractions. Lungs CTAB. Gastrointestinal: Soft and nontender. No distention. No abdominal bruits.  Musculoskeletal: No lower extremity tenderness nor edema.  Left elbow is erythematous over the ultrasound.  There are 2 small areas which are draining pus.  There are no  areas of fluctuance however.  The inner surface of the elbow joint is normal looking.  This appears to be consistent with BURSITIS. Neurologic:  Normal speech and language. No gross focal neurologic deficits are appreciated.  Patient is a little bit unsteady however. Skin:  Skin is warm, dry patient has the area on her elbow which is erythematous and warm.  We sent a wound culture of the pus that is draining after expressed some more pus.  He has very little pus to express however.  Patient also has some healing shallow ulcers on her abdomen that appear to have been pimples that ruptured.  None of these is bigger than dime size.  None of them is fresh.  Patient reports her some on her upper thigh as well.  We will have the nurse get her change.   ____________________________________________   LABS (all labs ordered are listed, but only abnormal results are displayed)  Labs Reviewed  COMPREHENSIVE METABOLIC PANEL - Abnormal; Notable for the following components:      Result Value   Glucose, Bld 122 (*)    All other components within normal limits  ACETAMINOPHEN LEVEL - Abnormal; Notable for the following components:   Acetaminophen (Tylenol), Serum <10 (*)    All other components within normal limits  CBC WITH DIFFERENTIAL/PLATELET - Abnormal; Notable for the following components:   WBC 12.4 (*)    MCV 101.5 (*)    MCH 36.4 (*)    Neutro Abs 9.9 (*)    Abs Immature Granulocytes 0.08 (*)    All other components within normal limits  URINALYSIS, COMPLETE (UACMP) WITH  MICROSCOPIC - Abnormal; Notable for the following components:   Color, Urine YELLOW (*)    APPearance CLEAR (*)    All other components within normal limits  AEROBIC CULTURE W GRAM STAIN (SUPERFICIAL SPECIMEN)  RESP PANEL BY RT-PCR (FLU A&B, COVID) ARPGX2  LACTIC ACID, PLASMA  LACTIC ACID, PLASMA  PREGNANCY, URINE  URINE DRUG SCREEN, QUALITATIVE ([facility] ONLY)  POC URINE PREG, ED  TROPONIN I (HIGH SENSITIVITY)  TROPONIN I (HIGH SENSITIVITY)   ____________________________________________  EKG EKG read interpreted by me shows sinus tachycardia rate 106 normal axis no acute ST-T wave changes.  The computer is reading minimal ST depression anterior laterally but this is actually not true.  ____________________________________________  RADIOLOGY Jill Poling, personally viewed and evaluated these images (plain radiographs) as part of my medical decision making, as well as reviewing the written report by the radiologist.  ED MD interpretation:    Official radiology report(s): No results found.  ____________________________________________   PROCEDURES  Procedure(s) performed (including Critical Care):  Procedures   ____________________________________________   INITIAL IMPRESSION / ASSESSMENT AND PLAN / ED COURSE  Patient with BURSITIS AND SOME CELLULITIS SURROUNDING IT.  SHE ALSO HAS MANIA.  I AM NOT SURE WHY EVERYTHING IS BECOMING CAPITALIZED.  PATIENT WILL HAVE TO BE ADMITTED.            ____________________________________________   FINAL CLINICAL IMPRESSION(S) / ED DIAGNOSES  Final diagnoses:  Rash  Cellulitis of left upper extremity  Olecranon bursitis of left elbow  Mania [facility])     ED Discharge Orders     None        Note:  This document was prepared using Dragon voice recognition software and may include unintentional dictation errors.    Arnaldo Natal, MD 08/17/21 1455

## 2021-08-17 NOTE — ED Notes (Signed)
Per Larita Fife in Dietary will send lunch tray

## 2021-08-18 DIAGNOSIS — F419 Anxiety disorder, unspecified: Secondary | ICD-10-CM

## 2021-08-18 DIAGNOSIS — F309 Manic episode, unspecified: Secondary | ICD-10-CM

## 2021-08-18 DIAGNOSIS — M7022 Olecranon bursitis, left elbow: Secondary | ICD-10-CM

## 2021-08-18 DIAGNOSIS — F3111 Bipolar disorder, current episode manic without psychotic features, mild: Secondary | ICD-10-CM

## 2021-08-18 LAB — CBC
HCT: 34.3 % — ABNORMAL LOW (ref 36.0–46.0)
Hemoglobin: 11.8 g/dL — ABNORMAL LOW (ref 12.0–15.0)
MCH: 35.3 pg — ABNORMAL HIGH (ref 26.0–34.0)
MCHC: 34.4 g/dL (ref 30.0–36.0)
MCV: 102.7 fL — ABNORMAL HIGH (ref 80.0–100.0)
Platelets: 277 10*3/uL (ref 150–400)
RBC: 3.34 MIL/uL — ABNORMAL LOW (ref 3.87–5.11)
RDW: 13.5 % (ref 11.5–15.5)
WBC: 9.6 10*3/uL (ref 4.0–10.5)
nRBC: 0 % (ref 0.0–0.2)

## 2021-08-18 LAB — CREATININE, SERUM
Creatinine, Ser: 0.82 mg/dL (ref 0.44–1.00)
GFR, Estimated: >60 mL/min (ref >60)

## 2021-08-18 MED ORDER — SULFAMETHOXAZOLE-TRIMETHOPRIM 800-160 MG PO TABS
1.0000 | ORAL_TABLET | Freq: Two times a day (BID) | ORAL | Status: DC
  Administered 2021-08-18: 1 via ORAL
  Filled 2021-08-18: qty 1

## 2021-08-18 MED ORDER — SULFAMETHOXAZOLE-TRIMETHOPRIM 800-160 MG PO TABS: 1.0000 | ORAL_TABLET | Freq: Two times a day (BID) | ORAL | 0 refills | Status: AC

## 2021-08-18 MED ORDER — OLANZAPINE 5 MG PO TABS: 5.0000 mg | ORAL_TABLET | Freq: Two times a day (BID) | ORAL | 0 refills | Status: DC

## 2021-08-18 NOTE — Discharge Summary (Signed)
[facility] [place] [facility]   PATIENT NAME: Tricia [name]    MR#:  [id number]  DATE OF BIRTH:  06-Nov-1966  DATE OF ADMISSION:  08/17/2021 ADMITTING PHYSICIAN: Lorretta Harp, MD  DATE OF DISCHARGE: 08/18/2021  PRIMARY CARE PHYSICIAN: [name], MD    ADMISSION DIAGNOSIS:  Rash [R21] Mania (HCC) [F30.9] Olecranon bursitis of left elbow [M70.22] Cellulitis of left upper extremity [L03.114] Cellulitis of left elbow [L03.114]  DISCHARGE DIAGNOSIS:  left Olecranon bursitis Bipolar do-mania Polysubstance drug abuse  SECONDARY DIAGNOSIS:   Past Medical History:  Diagnosis Date  . Abnormal MRI, cervical spine 08/2010   with uncovertebral disease on the left at C6-7 encroaching on the left C7 root.  . Angioedema 12/2012   ? food allergy - ED eval  . Anxiety    Panic  . Arrhythmia   . Bipolar 1 disorder (HCC)   . Cardiomyopathy    EF 45% by myoview, 45-50% by echo, 50% by MRI. ? PVC-associated cardiomyopathy  . Chest pain    ETT myoview 4/11) 6'15", frequent PVCs and run NSVT, mild global HK with EF 45%, fixed anterior defect was likely soft tissue attenuation so no evidence for ischemia or infarction.  . Depression    suicidal thoughts 2013, with [facility] eval  . Herpes simplex virus (HSV) infection    15 years ago  . Insomnia   . Interstitial cystitis    per [facility]  . Irritable bowel syndrome with diarrhea   . Premature ventricular contractions    resolved with metoprolol  . Toe fracture, right 11/04/2011   2,3rd toes  . Vaginal Pap smear, abnormal    Dysplasia 1994    HOSPITAL COURSE:    Tricia [name] is a 55 y.o. female with medical history significant of depression, anxiety, bipolar, PTSD, mania, PVC, IBS, interstitial cystitis, angioedema, tobacco abuse, alcohol abuse, sCHF with EF of 45-50%, who presents with left elbow pain, insomnia.  Sepsis due to cellulitis of left elbow: Left olecranon bursitis  Patient meets criteria for  sepsis with WBC 12.4, tachycardia with heart rate 120, RR 26.  Lactic acid is normal, 1.9 --> 1.3.  Currently hemodynamically stable. Dr. Hyacinth Meeker of ortho is consulted,--recommends po abxs and f/u out pt --change to po bactrim for 7 days --sepsis resolved - received IV vancomycin, Rocephin - PRN Zofran for nausea,Tylenol and ibuprofen for pain - Blood cultures x 2 neg -- no fever ,white count normal   Bipolar affective disorder, manic, moderate, and depression with anxiety: consulted psychiatry, NP Nanine Means.  -Highly appreciated psychiatry recommendation-- okay to DC IVC and sitter. Okay to discharge to go home per Nanine Means, NP psych -cont Lamictal 150 mg daily, prozac 20 mg daily, added Zyprexa -- patient much calm and composed today  Insomnia: -Remeron 45 mg daily at bedtime -Ambien 5 mg daily at bedtime   Chronic systolic CHF (congestive heart failure) (HCC): 2D echo on 02/09/2010 showed EF of 45 to 50%.  Patient does not have leg edema, no JVD, no respiratory distress or shortness of breath.  CHF seem to be compensated. -Check BNP   Tobacco abuse and Alcohol abuse: -Nicotine patch -CIWA protocol--scoring 0   Rash: Etiology is not clear.  Possibly due to allergic reaction to something -prn Benadryl 25 mg as needed     DVT ppx: SQ Heparin    Code Status: Full code Family Communication:  Yes, patient's mother by phone Disposition Plan:  Anticipate discharge back to previous  environment Consults called:  NP, Nanine Means of psychiatry Admission status and Level of care: Med-Surg:     as inpt             Dispo: The patient is from: Home              Anticipated d/c is to:  t home today              Patient currently is  medically stable to d/c.              Difficult to place patient No     CONSULTS OBTAINED:  Treatment Team:  Deeann Saint, MD  DRUG ALLERGIES:   Allergies  Allergen Reactions  . Bentyl [Dicyclomine Hcl]     tingling  . Clarithromycin      REACTION: nausea and vomiting    DISCHARGE MEDICATIONS:   Allergies as of 08/18/2021       Reactions   Bentyl [dicyclomine Hcl]    tingling   Clarithromycin    REACTION: nausea and vomiting        Medication List     STOP taking these medications    predniSONE 20 MG tablet Commonly known as: DELTASONE   QUEtiapine 100 MG tablet Commonly known as: SEROQUEL       TAKE these medications    FLUoxetine 20 MG capsule Commonly known as: PROZAC Take 1 capsule (20 mg total) by mouth daily.   hydrOXYzine 10 MG tablet Commonly known as: ATARAX/VISTARIL Take 1 tablet (10 mg total) by mouth 3 (three) times daily as needed.   ibuprofen 200 MG tablet Commonly known as: ADVIL Take 800 mg by mouth as needed.   lamoTRIgine 150 MG tablet Commonly known as: LAMICTAL Take 1 tablet (150 mg total) by mouth daily.   mirtazapine 45 MG tablet Commonly known as: REMERON Take 1 tablet (45 mg total) by mouth at bedtime.   OLANZapine 5 MG tablet Commonly known as: ZYPREXA Take 1 tablet (5 mg total) by mouth 2 (two) times daily.   sulfamethoxazole-trimethoprim 800-160 MG tablet Commonly known as: BACTRIM DS Take 1 tablet by mouth every 12 (twelve) hours for 7 days.   triamcinolone cream 0.5 % Commonly known as: KENALOG Apply 1 application topically 2 (two) times daily as needed.   zolpidem 5 MG tablet Commonly known as: Ambien Take 1 tablet (5 mg total) by mouth at bedtime as needed for sleep. What changed: how much to take        If you experience worsening of your admission symptoms, develop shortness of breath, life threatening emergency, suicidal or homicidal thoughts you must seek medical attention immediately by calling 911 or calling your MD immediately  if symptoms less severe.  You Must read complete instructions/literature along with all the possible adverse reactions/side effects for all the Medicines you take and that have been prescribed to you. Take any new  Medicines after you have completely understood and accept all the possible adverse reactions/side effects.   Please note  You were cared for by a hospitalist during your hospital stay. If you have any questions about your discharge medications or the care you received while you were in the hospital after you are discharged, you can call the unit and asked to speak with the hospitalist on call if the hospitalist that took care of you is not available. Once you are discharged, your primary care physician will handle any further medical issues. Please note that NO REFILLS for any discharge  medications will be authorized once you are discharged, as it is imperative that you return to your primary care physician (or establish a relationship with a primary care physician if you do not have one) for your aftercare needs so that they can reassess your need for medications and monitor your lab values. Today   SUBJECTIVE   feels a lot better. Slept good. Eating well.  VITAL SIGNS:  Blood pressure 113/73, pulse 72, temperature (!) 97.4 F (36.3 C), resp. rate 18, height  (1.702 m), weight 90.7 kg, SpO2 99 %.  I/O:   Intake/Output Summary (Last 24 hours) at 08/18/2021 1235 Last data filed at 08/18/2021 0900 Gross per 24 hour  Intake 120 ml  Output 900 ml  Net -780 ml    PHYSICAL EXAMINATION:  GENERAL:  55 y.o.-year-old patient lying in the bed with no acute distress.  LUNGS: Normal breath sounds bilaterally, no wheezing, rales,rhonchi or crepitation. No use of accessory muscles of respiration.  CARDIOVASCULAR: S1, S2 normal. No murmurs, rubs, or gallops.  ABDOMEN: Soft, non-tender, non-distended. Bowel sounds present. No organomegaly or mass.  EXTREMITIES: left elbow cellulitis improved.  NEUROLOGIC: Cranial nerves II through XII are intact. Muscle strength 5/5 in all extremities. Sensation intact. Gait not checked.  PSYCHIATRIC: The patient is alert and oriented x 3. Appears calm SKIN: No  obvious rash, lesion, or ulcer.   DATA REVIEW:   CBC  Recent Labs  Lab 08/18/21 0428  WBC 9.6  HGB 11.8*  HCT 34.3*  PLT 277    Chemistries  Recent Labs  Lab 08/17/21 1009 08/18/21 0428  NA 135  --   K 3.9  --   CL 100  --   CO2 25  --   GLUCOSE 122*  --   BUN 12  --   CREATININE 0.89 0.82  CALCIUM 9.7  --   AST 40  --   ALT 15  --   ALKPHOS 77  --   BILITOT 0.9  --     Microbiology Results   Recent Results (from the past 240 hour(s))  Aerobic Culture w Gram Stain (superficial specimen)     Status: None (Preliminary result)   Collection Time: 08/17/21 10:28 AM   Specimen: Wound  Result Value Ref Range Status   Specimen Description   Final    WOUND Performed at [facility], [address] [place], [place] [address]    Special Requests   Final    LA Performed at [facility], [address] [place], [place] [address]    Gram Stain   Final    RARE WBC PRESENT, PREDOMINANTLY PMN NO ORGANISMS SEEN    Culture   Final    RARE STAPHYLOCOCCUS AUREUS SUSCEPTIBILITIES TO FOLLOW Performed at [facility], [address], [place], [place] [address]    Report Status PENDING  Incomplete  Resp Panel by RT-PCR (Flu A&B, Covid)     Status: None   Collection Time: 08/17/21 10:57 AM   Specimen: Nasopharyngeal(NP) swabs in vial transport medium  Result Value Ref Range Status   SARS Coronavirus 2 by RT PCR NEGATIVE NEGATIVE Final    Comment: (NOTE) SARS-CoV-2 target nucleic acids are NOT DETECTED.  The SARS-CoV-2 RNA is generally detectable in upper respiratory specimens during the acute phase of infection. The lowest concentration of SARS-CoV-2 viral copies this assay can detect is 138 copies/mL. A negative result does not preclude SARS-Cov-2 infection and should not be used as the sole  basis for treatment or other patient management decisions. A negative result may occur with  improper specimen collection/handling, submission of  specimen other than nasopharyngeal swab, presence of viral mutation(s) within the areas targeted by this assay, and inadequate number of viral copies(<138 copies/mL). A negative result must be combined with clinical observations, patient history, and epidemiological information. The expected result is Negative.  Fact Sheet for Patients:  BloggerCourse.com  Fact Sheet for Healthcare Providers:  SeriousBroker.it  This test is no t yet approved or cleared by the [place] FDA and  has been authorized for detection and/or diagnosis of SARS-CoV-2 by FDA under an Emergency Use Authorization (EUA). This EUA will remain  in effect (meaning this test can be used) for the duration of the COVID-19 declaration under Section 564(b)(1) of the Act, 21 U.S.C.section 360bbb-3(b)(1), unless the authorization is terminated  or revoked sooner.       Influenza A by PCR NEGATIVE NEGATIVE Final   Influenza B by PCR NEGATIVE NEGATIVE Final    Comment: (NOTE) The Xpert Xpress SARS-CoV-2/FLU/RSV plus assay is intended as an aid in the diagnosis of influenza from Nasopharyngeal swab specimens and should not be used as a sole basis for treatment. Nasal washings and aspirates are unacceptable for Xpert Xpress SARS-CoV-2/FLU/RSV testing.  Fact Sheet for Patients: BloggerCourse.com  Fact Sheet for Healthcare Providers: SeriousBroker.it  This test is not yet approved or cleared by the [place] FDA and has been authorized for detection and/or diagnosis of SARS-CoV-2 by FDA under an Emergency Use Authorization (EUA). This EUA will remain in effect (meaning this test can be used) for the duration of the COVID-19 declaration under Section 564(b)(1) of the Act, 21 U.S.C. section 360bbb-3(b)(1), unless the authorization is terminated or revoked.  Performed at [facility], [address] [place], [place] [address]   Culture, blood (x 2)     Status: None (Preliminary result)   Collection Time: 08/17/21  5:53 PM   Specimen: BLOOD  Result Value Ref Range Status   Specimen Description BLOOD LEFT ANTECUBITAL  Final   Special Requests   Final    BOTTLES DRAWN AEROBIC AND ANAEROBIC Blood Culture adequate volume   Culture   Final    NO GROWTH < 12 HOURS Performed at [facility], [address] [place], [place] [address]    Report Status PENDING  Incomplete  Culture, blood (x 2)     Status: None (Preliminary result)   Collection Time: 08/17/21  6:04 PM   Specimen: BLOOD  Result Value Ref Range Status   Specimen Description BLOOD BLOOD LEFT HAND  Final   Special Requests   Final    BOTTLES DRAWN AEROBIC ONLY Blood Culture results may not be optimal due to an inadequate volume of blood received in culture bottles   Culture   Final    NO GROWTH < 12 HOURS Performed at [facility], [address], [place], [place] [address]    Report Status PENDING  Incomplete    RADIOLOGY:  No results found.   CODE STATUS:     Code Status Orders  (From admission, onward)           Start     Ordered   08/17/21 1744  Full code  Continuous        08/17/21 1743           Code Status History     This patient has a current code status but no historical code  status.        TOTAL TIME TAKING CARE OF THIS PATIENT: 35 minutes.    Enedina Finner M.D  [facility]    CC: Primary care physician; [name], MD

## 2021-08-18 NOTE — Consult Note (Signed)
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  WDL  Suicidal Thoughts:  No  Homicidal Thoughts:  No  Memory:  Immediate;   Fair Recent;   Fair Remote;   Fair  Judgement:  Good  Insight:  Fair  Psychomotor Activity:  Normal  Concentration:  Good  Recall:  Good  Fund of  Knowledge:  Good  Language:  Good  Akathisia:  No  Handed:  Right  AIMS (if indicated):     Assets:  Housing Leisure Time Physical Health Resilience Social Support  ADL's:  Intact  Cognition:  WDL  Sleep:        Physical Exam: Physical Exam Vitals and nursing note reviewed.  Constitutional:      Appearance: Normal appearance.  HENT:     Head: Normocephalic.     Nose: Nose normal.  Pulmonary:     Effort: Pulmonary effort is normal.  Musculoskeletal:        General: Normal range of motion.     Cervical back: Normal range of motion.  Neurological:     General: No focal deficit present.     Mental Status: She is alert and oriented to person, place, and time.  Psychiatric:        Attention and Perception: Attention and perception normal.        Mood and Affect: Mood normal.        Speech: Speech normal.        Behavior: Behavior is cooperative.        Thought Content: Thought content normal.        Cognition and Memory: Cognition and memory normal.        Judgment: Judgment normal.   Review of Systems  Skin:        Cellulitis and pain related to the inflammation  Psychiatric/Behavioral:  Positive for substance abuse. The patient has insomnia.   All other systems reviewed and are negative. Blood pressure 113/73, pulse 72, temperature (!) 97.4 F (36.3 C), resp. rate 18, height 5\' 7"  (1.702 m), weight 90.7 kg, SpO2 99 %. Body mass index is 31.32 kg/m.  Treatment Plan Summary: Daily contact with patient to assess and evaluate symptoms and progress in treatment, Medication management, and Plan Bipolar affective disorder, mania: -Continue Lamictal 150 mg daily -Continue Prozac 20 mg daily -Continue Zyprexa 5 mg BID -Refrain from substance use  Insomnia: -Continue Remeron 45 mg daily at bedtime -Continue Ambien 5 mg daily at bedtime  Disposition: Psychiatrically stable for discharge, follow up with outpatient provider  , NP 08/18/2021 11:48 AM
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
## 2021-08-19 LAB — AEROBIC CULTURE W GRAM STAIN (SUPERFICIAL SPECIMEN)

## 2021-08-20 ENCOUNTER — Telehealth: Payer: Self-pay | Admitting: Pharmacist

## 2021-08-20 NOTE — Progress Notes (Unsigned)
Pharmacy - Antimicrobial Stewardship Patient discharge on TMP/SMZ x 7 days.  Elbow bursitis wound culture grew MRSA resistant to TMP/SMZ, It was susceptible to tetracycline.  Discussed with Dr Enedina Finner who discharged patient. She asked that I speak with patient and call in new prescription for doxycyline 100mg  po BID x 7 days.  I was unable to reach patient, left message to call me back.  Prescription called into [facility] on [address].  [employer], PharmD, BCPS.   Work Cell: [phone] 08/20/2021 2:01 PM

## 2021-08-21 ENCOUNTER — Telehealth: Payer: Self-pay

## 2021-08-21 NOTE — Telephone Encounter (Signed)
Transition Care Management Unsuccessful Follow-up Telephone Call  Date of discharge and from where:  08/18/2021 [facility]  Attempts:  1st Attempt  Reason for unsuccessful TCM follow-up call:  No answer/busy    Rowe Pavy, RN, BSN, CEN [facility] Coordinator [phone]

## 2021-08-22 LAB — CULTURE, BLOOD (ROUTINE X 2)
Culture: NO GROWTH
Culture: NO GROWTH
Special Requests: ADEQUATE

## 2021-08-23 ENCOUNTER — Telehealth: Payer: Self-pay

## 2021-08-23 NOTE — Telephone Encounter (Signed)
Transition Care Management Unsuccessful Follow-up Telephone Call  Date of discharge and from where:  08/18/21 from [facility]  Attempts:  2nd Attempt  Reason for unsuccessful TCM follow-up call:  Left voice message   Kathyrn Sheriff, RN, MSN, BSN, CCM [facility] Care Management Coordinator [phone]

## 2021-08-24 ENCOUNTER — Telehealth (INDEPENDENT_AMBULATORY_CARE_PROVIDER_SITE_OTHER): Payer: No Payment, Other | Admitting: Psychiatry

## 2021-08-24 ENCOUNTER — Other Ambulatory Visit: Payer: Self-pay

## 2021-08-24 ENCOUNTER — Encounter (HOSPITAL_COMMUNITY): Payer: Self-pay | Admitting: Psychiatry

## 2021-08-24 DIAGNOSIS — F99 Mental disorder, not otherwise specified: Secondary | ICD-10-CM

## 2021-08-24 DIAGNOSIS — F3131 Bipolar disorder, current episode depressed, mild: Secondary | ICD-10-CM

## 2021-08-24 DIAGNOSIS — F5105 Insomnia due to other mental disorder: Secondary | ICD-10-CM

## 2021-08-24 MED ORDER — FLUOXETINE HCL 20 MG PO CAPS: 20.0000 mg | ORAL_CAPSULE | Freq: Every day | ORAL | 3 refills | Status: DC

## 2021-08-24 MED ORDER — OLANZAPINE 5 MG PO TABS: 10.0000 mg | ORAL_TABLET | Freq: Every day | ORAL | 3 refills | Status: DC

## 2021-08-24 MED ORDER — MIRTAZAPINE 45 MG PO TABS: 45.0000 mg | ORAL_TABLET | Freq: Every day | ORAL | 3 refills | Status: DC

## 2021-08-24 MED ORDER — LAMOTRIGINE 150 MG PO TABS: 150.0000 mg | ORAL_TABLET | Freq: Every day | ORAL | 2 refills | Status: DC
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
## 2021-08-27 ENCOUNTER — Other Ambulatory Visit: Payer: Self-pay

## 2021-08-27 ENCOUNTER — Emergency Department: Payer: No Typology Code available for payment source

## 2021-08-27 ENCOUNTER — Emergency Department
Admission: EM | Admit: 2021-08-27 | Discharge: 2021-08-27 | Disposition: A | Discharge status: Home / Self Care | Payer: No Typology Code available for payment source | Attending: Emergency Medicine | Admitting: Emergency Medicine

## 2021-08-27 DIAGNOSIS — Z5321 Procedure and treatment not carried out due to patient leaving prior to being seen by health care provider: Secondary | ICD-10-CM | POA: Insufficient documentation

## 2021-08-27 DIAGNOSIS — R079 Chest pain, unspecified: Secondary | ICD-10-CM | POA: Insufficient documentation

## 2021-08-27 DIAGNOSIS — R0602 Shortness of breath: Secondary | ICD-10-CM | POA: Insufficient documentation

## 2021-08-27 DIAGNOSIS — Y908 Blood alcohol level of 240 mg/100 ml or more: Secondary | ICD-10-CM | POA: Insufficient documentation

## 2021-08-27 DIAGNOSIS — I509 Heart failure, unspecified: Secondary | ICD-10-CM | POA: Insufficient documentation

## 2021-08-27 DIAGNOSIS — F1010 Alcohol abuse, uncomplicated: Secondary | ICD-10-CM | POA: Insufficient documentation

## 2021-08-27 LAB — ETHANOL: Alcohol, Ethyl (B): 241 mg/dL — ABNORMAL HIGH (ref <10)

## 2021-08-27 LAB — COMPREHENSIVE METABOLIC PANEL
ALT: 12 U/L (ref 0–44)
AST: 32 U/L (ref 15–41)
Albumin: 4.2 g/dL (ref 3.5–5.0)
Alkaline Phosphatase: 71 U/L (ref 38–126)
Anion gap: 11 (ref 5–15)
BUN: 8 mg/dL (ref 6–20)
CO2: 19 mmol/L — ABNORMAL LOW (ref 22–32)
Calcium: 9.4 mg/dL (ref 8.9–10.3)
Chloride: 108 mmol/L (ref 98–111)
Creatinine, Ser: 0.9 mg/dL (ref 0.44–1.00)
GFR, Estimated: >60 mL/min (ref >60)
Glucose, Bld: 98 mg/dL (ref 70–99)
Potassium: 3.3 mmol/L — ABNORMAL LOW (ref 3.5–5.1)
Sodium: 138 mmol/L (ref 135–145)
Total Bilirubin: 0.7 mg/dL (ref 0.3–1.2)
Total Protein: 7.8 g/dL (ref 6.5–8.1)

## 2021-08-27 LAB — CBC
HCT: 38.9 % (ref 36.0–46.0)
Hemoglobin: 14.1 g/dL (ref 12.0–15.0)
MCH: 35.9 pg — ABNORMAL HIGH (ref 26.0–34.0)
MCHC: 36.2 g/dL — ABNORMAL HIGH (ref 30.0–36.0)
MCV: 99 fL (ref 80.0–100.0)
Platelets: 320 10*3/uL (ref 150–400)
RBC: 3.93 MIL/uL (ref 3.87–5.11)
RDW: 12.5 % (ref 11.5–15.5)
WBC: 8.9 10*3/uL (ref 4.0–10.5)
nRBC: 0 % (ref 0.0–0.2)

## 2021-08-27 LAB — BRAIN NATRIURETIC PEPTIDE: B Natriuretic Peptide: 53.3 pg/mL (ref 0.0–100.0)

## 2021-08-27 LAB — TROPONIN I (HIGH SENSITIVITY): Troponin I (High Sensitivity): 8 ng/L (ref <18)

## 2021-08-27 NOTE — ED Triage Notes (Signed)
Pt in with [facility] and shob states was admitted for the same last week and was dx with CHF. Pt states has had chest pain since. Pt crying in triage, positive etoh.

## 2021-09-05 ENCOUNTER — Other Ambulatory Visit (HOSPITAL_COMMUNITY): Payer: Self-pay | Admitting: Psychiatry

## 2021-09-05 DIAGNOSIS — F99 Mental disorder, not otherwise specified: Secondary | ICD-10-CM

## 2021-09-14 ENCOUNTER — Ambulatory Visit (INDEPENDENT_AMBULATORY_CARE_PROVIDER_SITE_OTHER): Payer: No Typology Code available for payment source | Admitting: Family Medicine

## 2021-09-14 ENCOUNTER — Other Ambulatory Visit: Payer: Self-pay

## 2021-09-14 ENCOUNTER — Encounter: Payer: Self-pay | Admitting: Family Medicine

## 2021-09-14 VITALS — BP 138/68 | HR 94 | Temp 97.6°F | Ht 67.0 in | Wt 197.0 lb

## 2021-09-14 DIAGNOSIS — F1010 Alcohol abuse, uncomplicated: Secondary | ICD-10-CM

## 2021-09-14 DIAGNOSIS — I5022 Chronic systolic (congestive) heart failure: Secondary | ICD-10-CM

## 2021-09-14 DIAGNOSIS — N3010 Interstitial cystitis (chronic) without hematuria: Secondary | ICD-10-CM

## 2021-09-14 DIAGNOSIS — Z124 Encounter for screening for malignant neoplasm of cervix: Secondary | ICD-10-CM

## 2021-09-14 DIAGNOSIS — L03114 Cellulitis of left upper limb: Secondary | ICD-10-CM

## 2021-09-14 DIAGNOSIS — I509 Heart failure, unspecified: Secondary | ICD-10-CM

## 2021-09-14 DIAGNOSIS — R21 Rash and other nonspecific skin eruption: Secondary | ICD-10-CM

## 2021-09-14 DIAGNOSIS — R300 Dysuria: Secondary | ICD-10-CM

## 2021-09-14 DIAGNOSIS — H9190 Unspecified hearing loss, unspecified ear: Secondary | ICD-10-CM

## 2021-09-14 MED ORDER — HALOBETASOL PROPIONATE 0.05 % EX CREA: TOPICAL_CREAM | Freq: Two times a day (BID) | CUTANEOUS | 0 refills | Status: DC | PRN

## 2021-09-14 NOTE — Progress Notes (Signed)
This visit occurred during the SARS-CoV-2 public health emergency.  Safety protocols were in place, including screening questions prior to the visit, additional usage of staff PPE, and extensive cleaning of exam room while observing appropriate contact time as indicated for disinfecting solutions.  Inpatient f/u.  Inpatient course discussed with patient   ADMISSION DIAGNOSIS:   Rash [R21] Mania (HCC) [F30.9] Olecranon bursitis of left elbow [M70.22] Cellulitis of left upper extremity [L03.114] Cellulitis of left elbow [L03.114]  DISCHARGE DIAGNOSIS:  left Olecranon bursitis Bipolar d/o-mania Polysubstance drug abuse  Sepsis due to cellulitis of left elbow: Left olecranon bursitis  Patient meets criteria for sepsis with WBC 12.4, tachycardia with heart rate 120, RR 26.  Lactic acid is normal, 1.9 --> 1.3.  Currently hemodynamically stable. Dr. Hyacinth Meeker of ortho is consulted,--recommends po abxs and f/u out pt --change to po bactrim for 7 days --sepsis resolved - received IV vancomycin, Rocephin - PRN Zofran for nausea,Tylenol and ibuprofen for pain - Blood cultures x 2 neg -- no fever ,white count normal    Bipolar affective disorder, manic, moderate, and depression with anxiety: consulted psychiatry, NP Nanine Means.  -Highly appreciated psychiatry recommendation-- okay to DC IVC and sitter. Okay to discharge to go home per Nanine Means, NP psych -cont Lamictal 150 mg daily, prozac 20 mg daily, added Zyprexa -- patient much calm and composed today   Insomnia: -Remeron 45 mg daily at bedtime -Ambien 5 mg daily at bedtime   Chronic systolic CHF (congestive heart failure) (HCC): 2D echo on 02/09/2010 showed EF of 45 to 50%.  Patient does not have leg edema, no JVD, no respiratory distress or shortness of breath.  CHF seem to be compensated. -Check BNP   Tobacco abuse and Alcohol abuse: -Nicotine patch -CIWA protocol--scoring 0   Rash: Etiology is not clear.  Possibly due to  allergic reaction to something -prn Benadryl 25 mg as needed   ================== L elbow tender but clearly improved.  No fevers.  Anatomy discussed with patient, about olecranon bursitis.  She has a separate issue with itching and rash on the arms and leg and trunk.  H/o eczema.  Triamcinolone helped some with the rash.  No lesions on the palms.  D/w pt about derm referral.   Refer to gyn re: pap/etc. discussed.  Refer to cards re: CHF.  Heart failure pathophysiology discussed with patient, meaning decreased systolic efficiency.  She wanted to get set back up with cardiology and this makes sense.  No chest pain.  Refer to uro re: IC.  She needs to reestablish care  Refer to ENT re: R hearing loss.  She has a history of hearing loss and needs to see ENT.  History of alcohol use discussed with patient.  Living with brother, near her mother.  She has etoh monitor on car ignition, she is back to driving and applying for jobs.  She is back in [place].  Seeing BH in the meantime. She has an AA meeting tomorrow AM.    Meds, vitals, and allergies reviewed.   ROS: Per HPI unless specifically indicated in ROS section   GEN: nad, alert and oriented HEENT: ncat NECK: supple w/o LA CV: rrr.  no murmur PULM: ctab, no inc wob ABD: soft, +bs EXT: no edema SKIN: She has diffuse excoriated blanching mildly erythematous patches on the extremities and trunk but not on the palms of the hands.  No ulceration.  No abscess. Normal range of motion left elbow.  Nontender.  No swelling  of the bursa.  30 minutes were devoted to patient care in this encounter (this includes time spent reviewing the patient's file/history, interviewing and examining the patient, counseling/reviewing plan with patient).

## 2021-09-14 NOTE — Patient Instructions (Signed)
Stop triamcinolone and try halobetasol.  Use only if needed and sparingly.  Don't use on the face.  Please update me as needed.  Take care.  Glad to see you.

## 2021-09-16 NOTE — Assessment & Plan Note (Signed)
Refer to ENT

## 2021-09-16 NOTE — Assessment & Plan Note (Signed)
Looks like an eczema looks like diffuse eczema or variant thereof. Stop triamcinolone and try halobetasol.  Use only if needed and sparingly.  Don't use on the face.  I asked her to update me as needed.

## 2021-09-16 NOTE — Assessment & Plan Note (Signed)
Pathophysiology discussed with patient.  Refer to cardiology.

## 2021-09-16 NOTE — Assessment & Plan Note (Signed)
Refer back to urology 

## 2021-09-16 NOTE — Assessment & Plan Note (Signed)
Clearly improved.  No signs of bursitis or infection at this point.  Anatomy discussed with patient.  She will update me as needed.

## 2021-09-16 NOTE — Assessment & Plan Note (Signed)
History of.  Back and AAA.  She will continue follow-up with behavioral health.
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
## 2021-09-21 ENCOUNTER — Ambulatory Visit (INDEPENDENT_AMBULATORY_CARE_PROVIDER_SITE_OTHER): Payer: No Typology Code available for payment source | Admitting: Internal Medicine

## 2021-09-21 DIAGNOSIS — R002 Palpitations: Secondary | ICD-10-CM

## 2021-09-27 ENCOUNTER — Other Ambulatory Visit: Payer: Self-pay

## 2021-09-27 ENCOUNTER — Encounter: Payer: Self-pay | Admitting: Internal Medicine

## 2021-09-27 ENCOUNTER — Ambulatory Visit (INDEPENDENT_AMBULATORY_CARE_PROVIDER_SITE_OTHER): Payer: 59 | Admitting: Internal Medicine

## 2021-09-27 VITALS — BP 120/78 | HR 83 | Ht 68.0 in | Wt 205.6 lb

## 2021-09-27 DIAGNOSIS — R002 Palpitations: Secondary | ICD-10-CM
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
## 2021-09-27 NOTE — Patient Instructions (Signed)

## 2021-09-28 ENCOUNTER — Encounter: Payer: Self-pay | Admitting: Internal Medicine

## 2021-11-23 ENCOUNTER — Telehealth (INDEPENDENT_AMBULATORY_CARE_PROVIDER_SITE_OTHER): Payer: No Payment, Other | Admitting: Psychiatry

## 2021-11-23 ENCOUNTER — Encounter (HOSPITAL_COMMUNITY): Payer: Self-pay | Admitting: Psychiatry

## 2021-11-23 DIAGNOSIS — F3131 Bipolar disorder, current episode depressed, mild: Secondary | ICD-10-CM

## 2021-11-23 DIAGNOSIS — F99 Mental disorder, not otherwise specified: Secondary | ICD-10-CM

## 2021-11-23 DIAGNOSIS — F5105 Insomnia due to other mental disorder: Secondary | ICD-10-CM | POA: Diagnosis not present

## 2021-11-23 MED ORDER — MIRTAZAPINE 45 MG PO TABS: 45.0000 mg | ORAL_TABLET | Freq: Every day | ORAL | 3 refills | Status: DC

## 2021-11-23 MED ORDER — ZOLPIDEM TARTRATE 5 MG PO TABS: 5.0000 mg | ORAL_TABLET | Freq: Every evening | ORAL | 2 refills | Status: DC | PRN

## 2021-11-23 MED ORDER — LAMOTRIGINE 200 MG PO TABS: 200.0000 mg | ORAL_TABLET | Freq: Every day | ORAL | 3 refills | Status: DC

## 2021-11-23 MED ORDER — FLUOXETINE HCL 20 MG PO CAPS: 20.0000 mg | ORAL_CAPSULE | Freq: Every day | ORAL | 3 refills | Status: DC
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
## 2022-02-01 ENCOUNTER — Telehealth (HOSPITAL_COMMUNITY): Payer: No Typology Code available for payment source | Admitting: Psychiatry

## 2022-02-04 ENCOUNTER — Telehealth (INDEPENDENT_AMBULATORY_CARE_PROVIDER_SITE_OTHER): Payer: No Payment, Other | Admitting: Psychiatry

## 2022-02-04 DIAGNOSIS — F3131 Bipolar disorder, current episode depressed, mild: Secondary | ICD-10-CM

## 2022-02-04 DIAGNOSIS — F4310 Post-traumatic stress disorder, unspecified: Secondary | ICD-10-CM

## 2022-02-04 DIAGNOSIS — F5105 Insomnia due to other mental disorder: Secondary | ICD-10-CM

## 2022-02-04 DIAGNOSIS — F99 Mental disorder, not otherwise specified: Secondary | ICD-10-CM

## 2022-02-04 DIAGNOSIS — F3112 Bipolar disorder, current episode manic without psychotic features, moderate: Secondary | ICD-10-CM | POA: Diagnosis not present

## 2022-02-04 MED ORDER — LAMOTRIGINE 200 MG PO TABS: 200.0000 mg | ORAL_TABLET | Freq: Every day | ORAL | 2 refills | Status: DC

## 2022-02-04 MED ORDER — MIRTAZAPINE 45 MG PO TABS: 45.0000 mg | ORAL_TABLET | Freq: Every day | ORAL | 2 refills | Status: DC

## 2022-02-04 MED ORDER — ZOLPIDEM TARTRATE 5 MG PO TABS: 5.0000 mg | ORAL_TABLET | Freq: Every evening | ORAL | 2 refills | Status: DC | PRN

## 2022-02-04 MED ORDER — FLUOXETINE HCL 20 MG PO CAPS: 20.0000 mg | ORAL_CAPSULE | Freq: Every day | ORAL | 2 refills | Status: DC

## 2022-02-04 NOTE — Progress Notes (Signed)
That alert oriented hello you doing good how you I am sorry ?
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
## 2022-02-28 ENCOUNTER — Other Ambulatory Visit: Payer: Self-pay | Admitting: Nurse Practitioner

## 2022-02-28 DIAGNOSIS — N6310 Unspecified lump in the right breast, unspecified quadrant: Secondary | ICD-10-CM

## 2022-03-18 ENCOUNTER — Encounter: Payer: Self-pay | Admitting: Internal Medicine

## 2022-03-19 ENCOUNTER — Ambulatory Visit
Admission: RE | Admit: 2022-03-19 | Discharge: 2022-03-19 | Disposition: A | Discharge status: Home / Self Care | Payer: No Typology Code available for payment source | Source: Ambulatory Visit | Attending: Nurse Practitioner | Admitting: Nurse Practitioner

## 2022-03-19 ENCOUNTER — Ambulatory Visit
Admission: RE | Admit: 2022-03-19 | Discharge: 2022-03-19 | Disposition: A | Discharge status: Home / Self Care | Payer: BC Managed Care – PPO | Source: Ambulatory Visit | Attending: Nurse Practitioner | Admitting: Nurse Practitioner

## 2022-03-19 ENCOUNTER — Other Ambulatory Visit: Payer: Self-pay | Admitting: Nurse Practitioner

## 2022-03-19 DIAGNOSIS — N6310 Unspecified lump in the right breast, unspecified quadrant: Secondary | ICD-10-CM

## 2022-03-21 ENCOUNTER — Ambulatory Visit
Admission: RE | Admit: 2022-03-21 | Discharge: 2022-03-21 | Disposition: A | Discharge status: Home / Self Care | Payer: BC Managed Care – PPO | Source: Ambulatory Visit | Attending: Nurse Practitioner | Admitting: Nurse Practitioner

## 2022-03-21 DIAGNOSIS — N6310 Unspecified lump in the right breast, unspecified quadrant: Secondary | ICD-10-CM

## 2022-05-01 ENCOUNTER — Telehealth (INDEPENDENT_AMBULATORY_CARE_PROVIDER_SITE_OTHER): Payer: BC Managed Care – PPO | Admitting: Psychiatry

## 2022-05-01 ENCOUNTER — Encounter (HOSPITAL_COMMUNITY): Payer: Self-pay | Admitting: Psychiatry

## 2022-05-01 DIAGNOSIS — F99 Mental disorder, not otherwise specified: Secondary | ICD-10-CM | POA: Diagnosis not present

## 2022-05-01 DIAGNOSIS — F3131 Bipolar disorder, current episode depressed, mild: Secondary | ICD-10-CM

## 2022-05-01 DIAGNOSIS — F5105 Insomnia due to other mental disorder: Secondary | ICD-10-CM | POA: Diagnosis not present

## 2022-05-01 MED ORDER — TRAZODONE HCL 50 MG PO TABS: 50.0000 mg | ORAL_TABLET | Freq: Every evening | ORAL | 3 refills | Status: DC | PRN

## 2022-05-01 MED ORDER — ZOLPIDEM TARTRATE 5 MG PO TABS: 5.0000 mg | ORAL_TABLET | Freq: Every evening | ORAL | 2 refills | Status: DC | PRN

## 2022-05-01 MED ORDER — MIRTAZAPINE 45 MG PO TABS: 45.0000 mg | ORAL_TABLET | Freq: Every day | ORAL | 3 refills | Status: DC

## 2022-05-01 MED ORDER — LAMOTRIGINE 200 MG PO TABS: 200.0000 mg | ORAL_TABLET | Freq: Every day | ORAL | 3 refills | Status: DC
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
## 2022-05-23 ENCOUNTER — Ambulatory Visit: Payer: No Typology Code available for payment source | Admitting: Internal Medicine

## 2022-08-01 ENCOUNTER — Encounter (HOSPITAL_COMMUNITY): Payer: Self-pay | Admitting: Psychiatry

## 2022-08-01 ENCOUNTER — Telehealth (INDEPENDENT_AMBULATORY_CARE_PROVIDER_SITE_OTHER): Payer: BC Managed Care – PPO | Admitting: Psychiatry

## 2022-08-01 DIAGNOSIS — F3131 Bipolar disorder, current episode depressed, mild: Secondary | ICD-10-CM

## 2022-08-01 DIAGNOSIS — F5105 Insomnia due to other mental disorder: Secondary | ICD-10-CM | POA: Diagnosis not present

## 2022-08-01 DIAGNOSIS — F99 Mental disorder, not otherwise specified: Secondary | ICD-10-CM

## 2022-08-01 MED ORDER — MIRTAZAPINE 45 MG PO TABS: 45.0000 mg | ORAL_TABLET | Freq: Every day | ORAL | 3 refills | Status: DC

## 2022-08-01 MED ORDER — LAMOTRIGINE 200 MG PO TABS: 200.0000 mg | ORAL_TABLET | Freq: Every day | ORAL | 3 refills | Status: DC

## 2022-08-01 MED ORDER — ZOLPIDEM TARTRATE 5 MG PO TABS: 5.0000 mg | ORAL_TABLET | Freq: Every evening | ORAL | 2 refills | Status: DC | PRN
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
## 2022-11-01 ENCOUNTER — Encounter (HOSPITAL_COMMUNITY): Payer: Self-pay | Admitting: Student in an Organized Health Care Education/Training Program

## 2022-11-01 ENCOUNTER — Telehealth (HOSPITAL_COMMUNITY): Payer: Self-pay | Admitting: Student in an Organized Health Care Education/Training Program

## 2022-11-01 ENCOUNTER — Telehealth (INDEPENDENT_AMBULATORY_CARE_PROVIDER_SITE_OTHER): Payer: BC Managed Care – PPO | Admitting: Student in an Organized Health Care Education/Training Program

## 2022-11-01 DIAGNOSIS — F3131 Bipolar disorder, current episode depressed, mild: Secondary | ICD-10-CM

## 2022-11-01 DIAGNOSIS — F5105 Insomnia due to other mental disorder: Secondary | ICD-10-CM | POA: Diagnosis not present

## 2022-11-01 DIAGNOSIS — F99 Mental disorder, not otherwise specified: Secondary | ICD-10-CM

## 2022-11-01 MED ORDER — MIRTAZAPINE 45 MG PO TABS: 45.0000 mg | ORAL_TABLET | Freq: Every day | ORAL | 3 refills | Status: DC

## 2022-11-01 MED ORDER — LAMOTRIGINE 200 MG PO TABS: 200.0000 mg | ORAL_TABLET | Freq: Every day | ORAL | 3 refills | Status: DC

## 2022-11-01 MED ORDER — ZOLPIDEM TARTRATE 5 MG PO TABS: 5.0000 mg | ORAL_TABLET | Freq: Every evening | ORAL | 2 refills | Status: DC | PRN
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
## 2022-12-09 LAB — HEPATIC FUNCTION PANEL
ALT: 10 U/L (ref 7–35)
AST: 33 (ref 13–35)

## 2022-12-09 LAB — CBC AND DIFFERENTIAL
Hemoglobin: 13.7 (ref 12.0–16.0)
Platelets: 248 10*3/uL (ref 150–400)
WBC: 6.1

## 2022-12-09 LAB — LIPID PANEL
Cholesterol: 273 — AB (ref 0–200)
HDL: 129 — AB (ref 35–70)
LDL Cholesterol: 128
Triglycerides: 89 (ref 40–160)

## 2023-02-03 ENCOUNTER — Telehealth (HOSPITAL_COMMUNITY): Payer: BC Managed Care – PPO | Admitting: Student in an Organized Health Care Education/Training Program

## 2023-02-10 ENCOUNTER — Encounter (HOSPITAL_COMMUNITY): Payer: Self-pay | Admitting: Student in an Organized Health Care Education/Training Program

## 2023-02-10 ENCOUNTER — Telehealth (INDEPENDENT_AMBULATORY_CARE_PROVIDER_SITE_OTHER): Payer: BC Managed Care – PPO | Admitting: Student in an Organized Health Care Education/Training Program

## 2023-02-10 DIAGNOSIS — F4310 Post-traumatic stress disorder, unspecified: Secondary | ICD-10-CM | POA: Diagnosis not present

## 2023-02-10 DIAGNOSIS — F3131 Bipolar disorder, current episode depressed, mild: Secondary | ICD-10-CM | POA: Diagnosis not present

## 2023-02-10 DIAGNOSIS — F411 Generalized anxiety disorder: Secondary | ICD-10-CM

## 2023-02-10 DIAGNOSIS — F5105 Insomnia due to other mental disorder: Secondary | ICD-10-CM

## 2023-02-10 DIAGNOSIS — F99 Mental disorder, not otherwise specified: Secondary | ICD-10-CM

## 2023-02-10 MED ORDER — LAMOTRIGINE 200 MG PO TABS: 200.0000 mg | ORAL_TABLET | Freq: Every day | ORAL | 3 refills | Status: DC

## 2023-02-10 MED ORDER — ZOLPIDEM TARTRATE 5 MG PO TABS: 5.0000 mg | ORAL_TABLET | Freq: Every evening | ORAL | 2 refills | Status: DC | PRN

## 2023-02-10 MED ORDER — MIRTAZAPINE 45 MG PO TABS: 45.0000 mg | ORAL_TABLET | Freq: Every day | ORAL | 3 refills | Status: DC

## 2023-02-10 MED ORDER — HYDROXYZINE HCL 10 MG PO TABS: 10.0000 mg | ORAL_TABLET | Freq: Three times a day (TID) | ORAL | 1 refills | Status: DC | PRN
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
## 2023-02-11 ENCOUNTER — Ambulatory Visit: Payer: BC Managed Care – PPO | Admitting: Family Medicine

## 2023-02-11 ENCOUNTER — Encounter: Payer: Self-pay | Admitting: Family Medicine

## 2023-02-11 VITALS — BP 160/80 | HR 91 | Temp 98.0°F | Ht 68.0 in | Wt 202.0 lb

## 2023-02-11 DIAGNOSIS — R300 Dysuria: Secondary | ICD-10-CM | POA: Diagnosis not present

## 2023-02-11 DIAGNOSIS — I10 Essential (primary) hypertension: Secondary | ICD-10-CM

## 2023-02-11 MED ORDER — AMLODIPINE BESYLATE 5 MG PO TABS: 5.0000 mg | ORAL_TABLET | Freq: Every day | ORAL | 1 refills | Status: DC

## 2023-02-11 NOTE — Patient Instructions (Addendum)
Please ask the front for a record release to get a copy of your labs. Go to the lab on the way out.   If you have mychart we'll likely use that to update you.    Take care.  Glad to see you. If you have ankle swelling or lightheadedness, then cut the tab in half and update me.  If your BP stays above 140/90, then let me know.

## 2023-02-11 NOTE — Progress Notes (Unsigned)
She is working at [employer], discussed.  Smoking about 2 cigs in the AM, then again late afternoon.  <1/2 PPD.  This is less than prior.  Discussed using nicotine gum for replacement and taper.    She is on rinvoq and that helped a lot with her skin.  Per outside clinic.  I will defer.  She is following up with psychiatry.  Her mood is good.    BP elevation at UC.  Some occ HA.  No CP.  Not SOB.  No BLE edema.  She still has some urinary frequency in spite of prev abx use.  She walked a 5K a few months ago.  She wanted to make sure that her previous UTI had resolved.  See notes on labs.  Meds, vitals, and allergies reviewed.   ROS: Per HPI unless specifically indicated in ROS section   Recheck BP 160/80.    GEN: nad, alert and oriented HEENT: ncat, speech normal. NECK: supple w/o LA CV: rrr.  PULM: ctab, no inc wob ABD: soft, +bs EXT: no edema SKIN: Well-perfused.

## 2023-02-12 ENCOUNTER — Encounter: Payer: Self-pay | Admitting: Family Medicine

## 2023-02-12 DIAGNOSIS — R300 Dysuria: Secondary | ICD-10-CM | POA: Insufficient documentation

## 2023-02-12 DIAGNOSIS — I10 Essential (primary) hypertension: Secondary | ICD-10-CM

## 2023-02-12 HISTORY — DX: Essential (primary) hypertension: I10

## 2023-02-12 LAB — URINE CULTURE
MICRO NUMBER:: 14771911
SPECIMEN QUALITY:: ADEQUATE

## 2023-02-12 NOTE — Assessment & Plan Note (Addendum)
Noted on multiple checks.  She had labs done at outside clinic and I have asked her to request a copy of those for [place] here. Start amlodipine 5 mg.  Routine cautions given to patient. If ankle swelling or lightheadedness, then cut the tab in half and update me.  If BP stays above 140/90, then let me know.

## 2023-02-12 NOTE — Assessment & Plan Note (Addendum)
See notes on urine culture. 

## 2023-02-20 ENCOUNTER — Telehealth: Payer: Self-pay | Admitting: Family Medicine

## 2023-02-20 MED ORDER — AMLODIPINE BESYLATE 5 MG PO TABS: 2.5000 mg | ORAL_TABLET | Freq: Every day | ORAL | Status: DC

## 2023-02-20 NOTE — Telephone Encounter (Signed)
Patient called in stating that b/p medication  amLODipine (NORVASC) 5 MG tablet makes her dizzy,and she would like to know if a different b/p medication can be prescribed for her?

## 2023-02-20 NOTE — Telephone Encounter (Signed)
Spoke with patient and she could not check her BP as she is at work. Last night she states that her BP was 155/95 and was having some dizziness. Patient states that is still a good number for her as it has been much higher before she started the medication. I advised her to try the half tablet of amlodipine tonight and if that does not help to let us know.

## 2023-02-20 NOTE — Telephone Encounter (Signed)
Please have her check her BP in the meantime and let us know about that.   Would try cutting amlodipine in half in the meantime, down to 2.5mg .  see if she feels better with that.  Thanks.

## 2023-02-21 NOTE — Telephone Encounter (Signed)
Called patient reviewed all information and repeated back to me. Will call if any questions.  She will send my chart message in a few days to let us know how she is doing. She had no questions about changes requested in medication.

## 2023-02-21 NOTE — Telephone Encounter (Signed)
It usually is the case that the issue is a lower BP.  However, if her BP is sig higher on the lower dose and the symptoms are worse, she may need to go back to 5mg  for 2-3 days, then increase to 7.5mg  amlodipine.  Please let me know how it goes.  Thanks.

## 2023-05-09 ENCOUNTER — Telehealth (INDEPENDENT_AMBULATORY_CARE_PROVIDER_SITE_OTHER): Payer: BC Managed Care – PPO | Admitting: Student in an Organized Health Care Education/Training Program

## 2023-05-09 ENCOUNTER — Encounter (HOSPITAL_COMMUNITY): Payer: Self-pay | Admitting: Student in an Organized Health Care Education/Training Program

## 2023-05-09 DIAGNOSIS — F99 Mental disorder, not otherwise specified: Secondary | ICD-10-CM | POA: Diagnosis not present

## 2023-05-09 DIAGNOSIS — F411 Generalized anxiety disorder: Secondary | ICD-10-CM | POA: Diagnosis not present

## 2023-05-09 DIAGNOSIS — F5105 Insomnia due to other mental disorder: Secondary | ICD-10-CM | POA: Diagnosis not present

## 2023-05-09 DIAGNOSIS — F3131 Bipolar disorder, current episode depressed, mild: Secondary | ICD-10-CM

## 2023-05-09 MED ORDER — LAMOTRIGINE 200 MG PO TABS: 200.0000 mg | ORAL_TABLET | Freq: Every day | ORAL | 3 refills | Status: DC

## 2023-05-09 MED ORDER — HYDROXYZINE HCL 10 MG PO TABS: 10.0000 mg | ORAL_TABLET | Freq: Three times a day (TID) | ORAL | 1 refills | Status: DC | PRN

## 2023-05-09 MED ORDER — ZOLPIDEM TARTRATE 5 MG PO TABS
5.0000 mg | ORAL_TABLET | Freq: Every evening | ORAL | 2 refills | Status: AC | PRN
Start: 2023-05-09 — End: ?

## 2023-05-09 MED ORDER — MIRTAZAPINE 45 MG PO TABS
45.0000 mg | ORAL_TABLET | Freq: Every day | ORAL | 3 refills | Status: AC
Start: 2023-05-09 — End: ?
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
## 2023-05-27 LAB — BASIC METABOLIC PANEL
Chloride: 93 — AB (ref 99–108)
Creatinine: 1 (ref 0.5–1.1)
Glucose: 110
Potassium: 4.7 mEq/L (ref 3.5–5.1)
Sodium: 135 — AB (ref 137–147)

## 2023-05-27 LAB — HEPATIC FUNCTION PANEL
ALT: 19 U/L (ref 7–35)
AST: 76 — AB (ref 13–35)
Alkaline Phosphatase: 71 (ref 25–125)

## 2023-05-27 LAB — LIPID PANEL
Cholesterol: 317 — AB (ref 0–200)
HDL: 147 — AB (ref 35–70)
LDL Cholesterol: 160
Triglycerides: 73 (ref 40–160)

## 2023-05-27 LAB — CBC AND DIFFERENTIAL
Hemoglobin: 13.6 (ref 12.0–16.0)
Platelets: 267 10*3/uL (ref 150–400)
WBC: 4.9

## 2023-05-27 LAB — COMPREHENSIVE METABOLIC PANEL: Albumin: 5.3 — AB (ref 3.5–5.0)

## 2023-05-28 LAB — LAB REPORT - SCANNED: EGFR: 65

## 2023-06-03 ENCOUNTER — Ambulatory Visit: Payer: BC Managed Care – PPO | Admitting: Family Medicine

## 2023-06-04 ENCOUNTER — Encounter: Payer: Self-pay | Admitting: Family Medicine

## 2023-07-05 IMAGING — MG DIGITAL DIAGNOSTIC BILAT W/ TOMO W/ CAD
6 of 10 series · 6 of 30 positions shown · non-contrast
Comparison: Previous exam(s).

CLINICAL DATA: 56-year-old female presenting for evaluation of a
new lump in the right breast for approximately 1 month. No
associated tenderness. Family history of breast cancer in the
patient's sister.

EXAM:
DIGITAL DIAGNOSTIC BILATERAL MAMMOGRAM WITH TOMOSYNTHESIS AND CAD;
ULTRASOUND RIGHT BREAST LIMITED
TECHNIQUE: Bilateral digital diagnostic mammography and breast tomosynthesis
was performed. The images were evaluated with computer-aided
detection.; Targeted ultrasound examination of the right breast was
performed

[R TAN synth-2D]
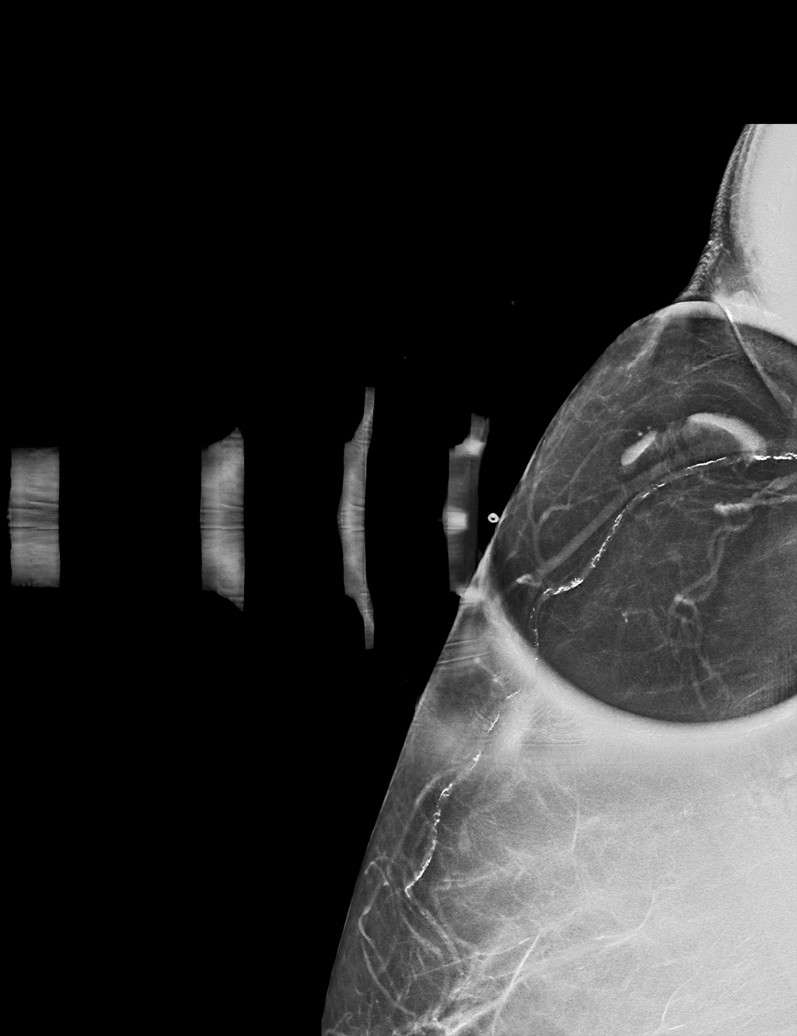

[R MLO synth-2D]
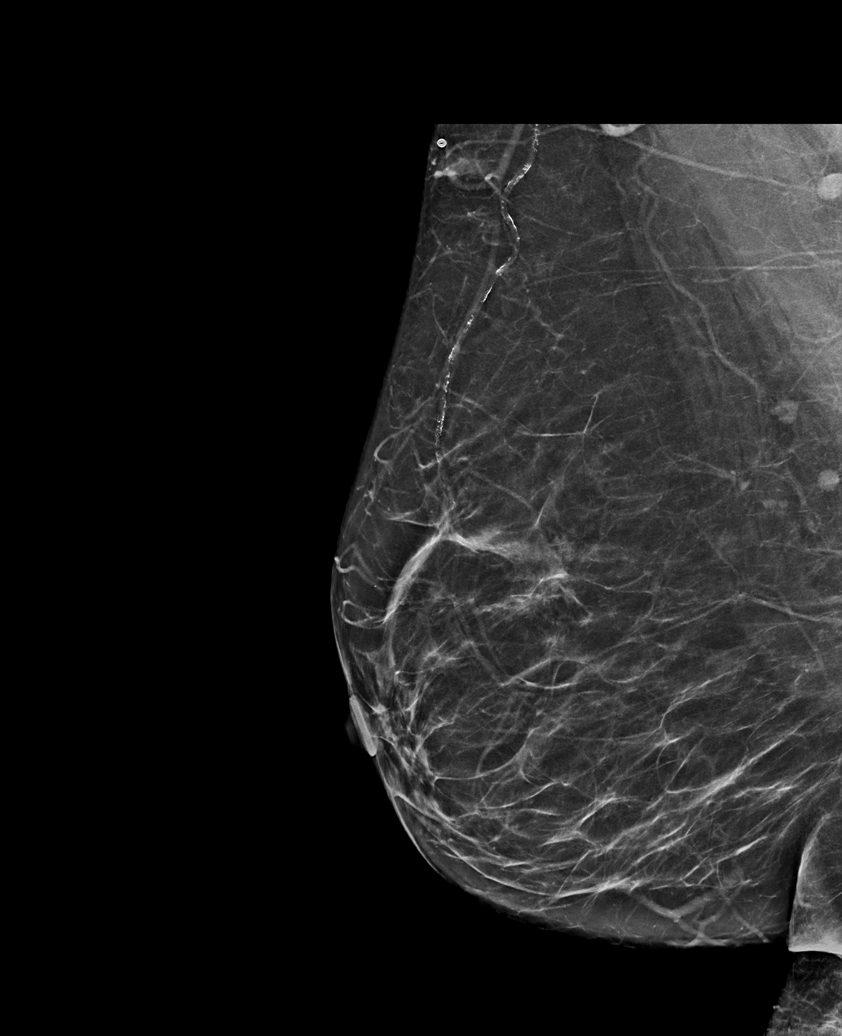

[L CC synth-2D]
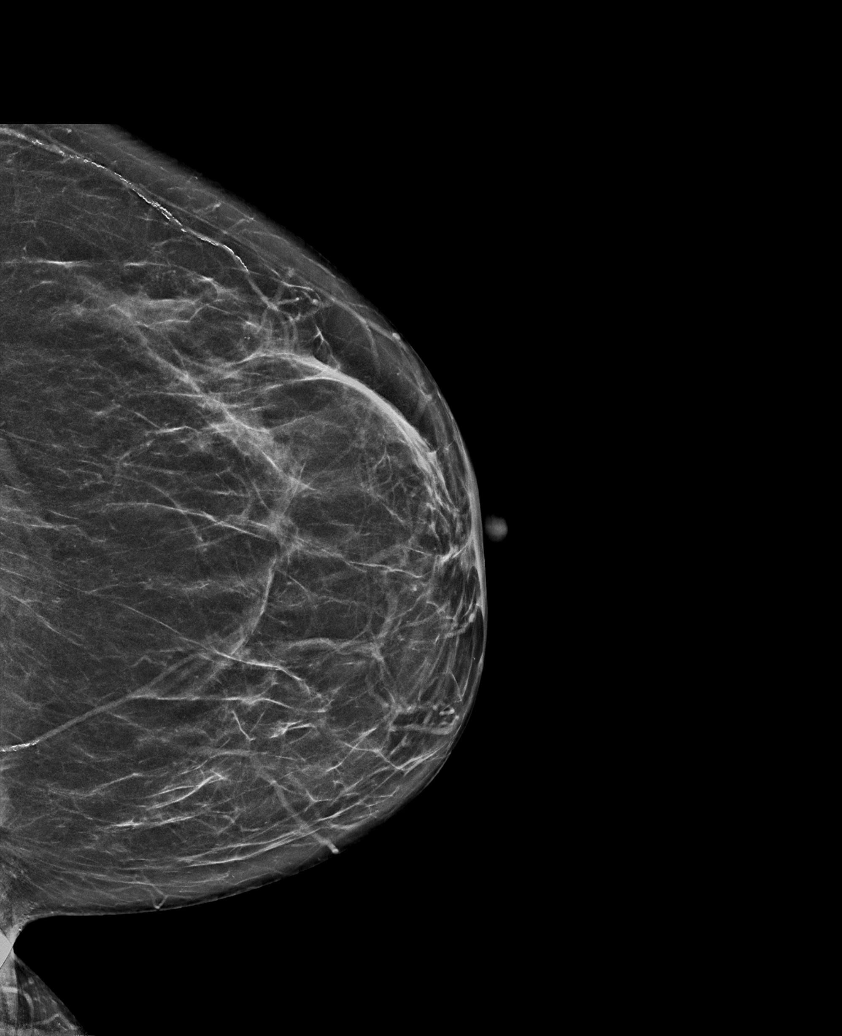

[L MLO synth-2D]
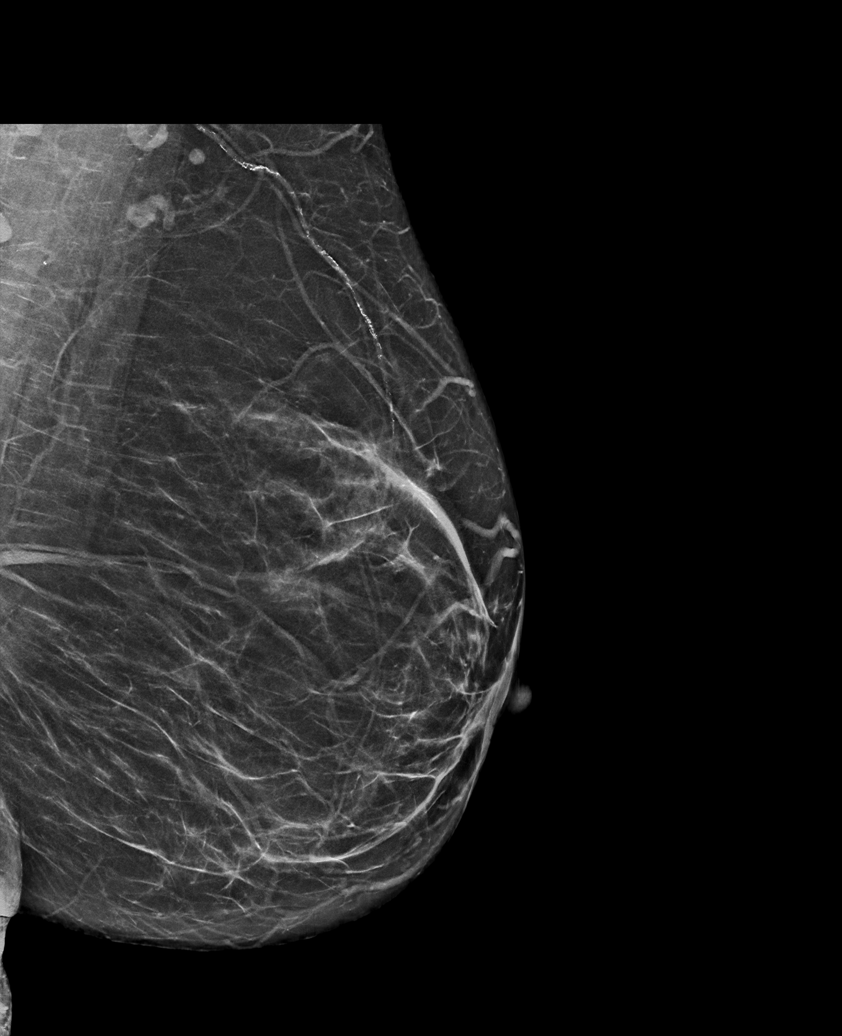

[R CC synth-2D]
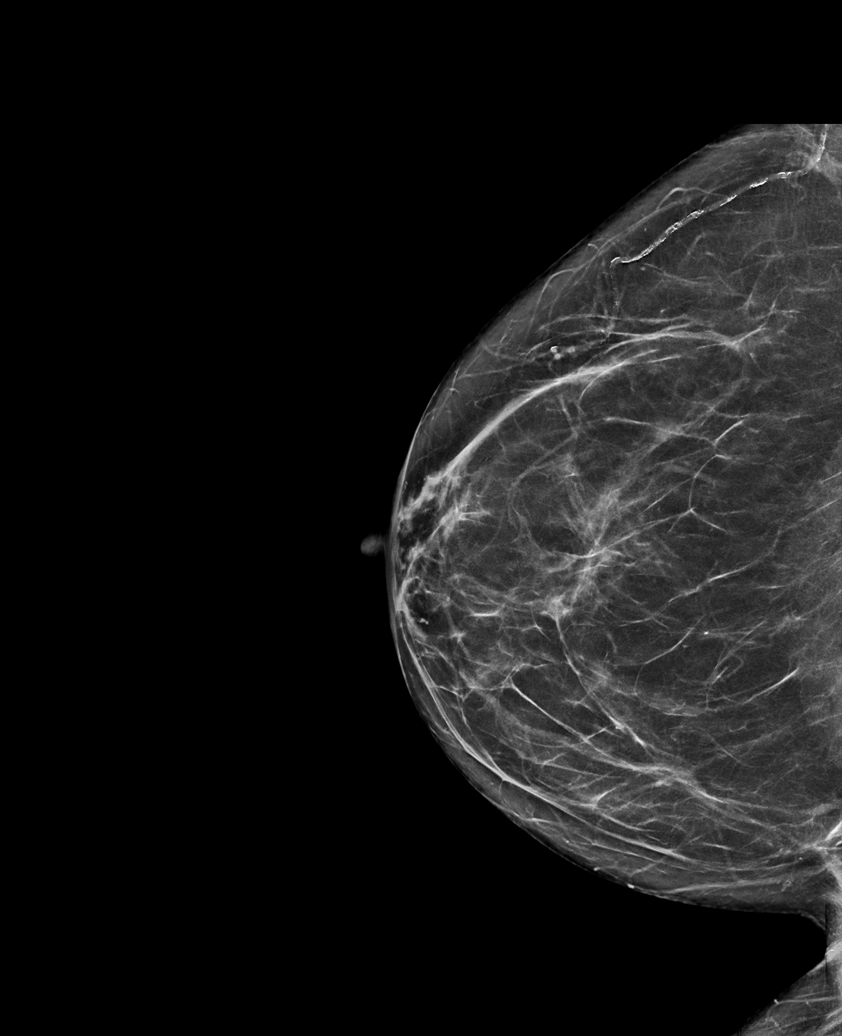

[L MLO tomo · tomo slice 33/65.0]
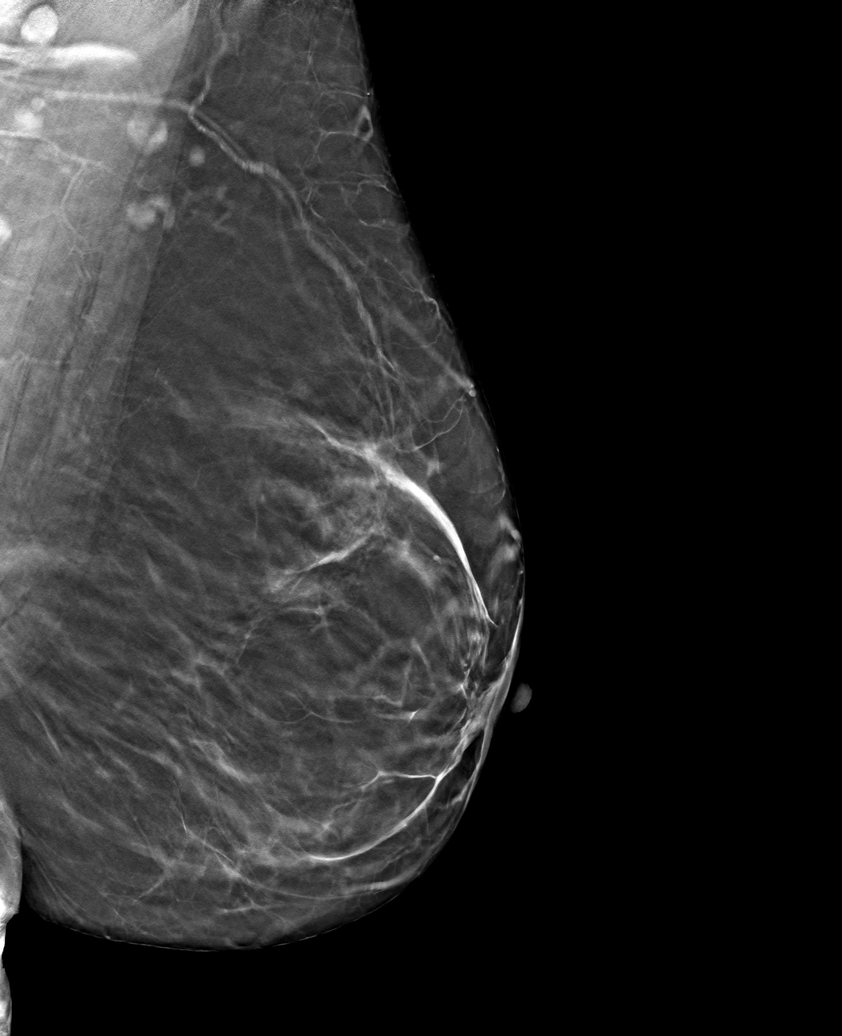

[6 of 30 positions shown; findings below may reference images not displayed]

ACR Breast Density Category b: There are scattered areas of
fibroglandular density.
FINDINGS: Mammogram:

Right breast: A skin BB marks the palpable site of concern reported
by the patient in the right axillary tail. A spot tangential view of
this area is performed in addition to standard views. There is no
definite abnormality on the spot view however there is a possible
small asymmetry on the full MLO view. There are no additional
suspicious findings elsewhere in the right breast.

Left breast: No suspicious mass, distortion, or microcalcifications
are identified to suggest presence of malignancy.

On physical exam at the site of concern reported by the patient in
the right axillary tail I feel a discrete superficial mass. There
are no associated skin changes or bruising.

Ultrasound:

Targeted ultrasound is performed at the palpable site of concern
reported by the patient at 10 o'clock 12 cm from the nipple
demonstrating an irregular mixed echogenicity mass measuring 1.0 x
0.7 x 1.1 cm. There is associated vascularity.

Targeted ultrasound of the right axilla demonstrates normal lymph
nodes.
IMPRESSION: Indeterminate mixed echogenicity mass at the palpable site of
concern in the right axillary tail at 10 o'clock.

RECOMMENDATION:
Ultrasound-guided core needle biopsy of the right breast mass.

I have discussed the findings and recommendations with the patient
who agrees to proceed with biopsy. The patient will be scheduled for
the biopsy appointment prior to leaving the office today.

BI-RADS CATEGORY  4: Suspicious.

## 2023-07-07 IMAGING — US US  BREAST BX W/ LOC DEV 1ST LESION IMG BX SPEC US GUIDE*R*
1 series · 6 of 6 positions shown · non-contrast
Comparison: Previous exams.
COMPARISON: Previous exams.

Addendum:
CLINICAL DATA: 56-year-old with a palpable indeterminate
heterogeneous 1.1 cm superficial mass in the UPPER OUTER QUADRANT of
the RIGHT breast at 10 o'clock 12 cm from the nipple.

EXAM:
ULTRASOUND GUIDED RIGHT BREAST CORE NEEDLE BIOPSY

[Series 1: us breast bx w/ loc dev 1st lesion img bx spec us  · 0.05mm/px · 6 of 6 slices shown]
[im 1/6]
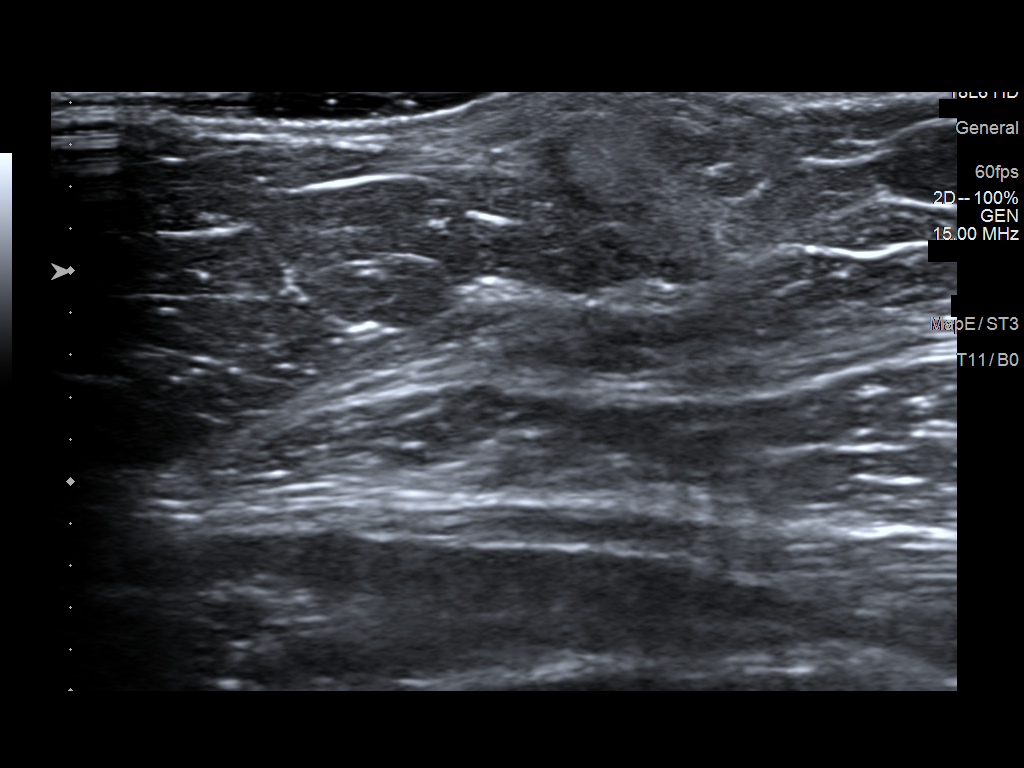
[im 2/6]
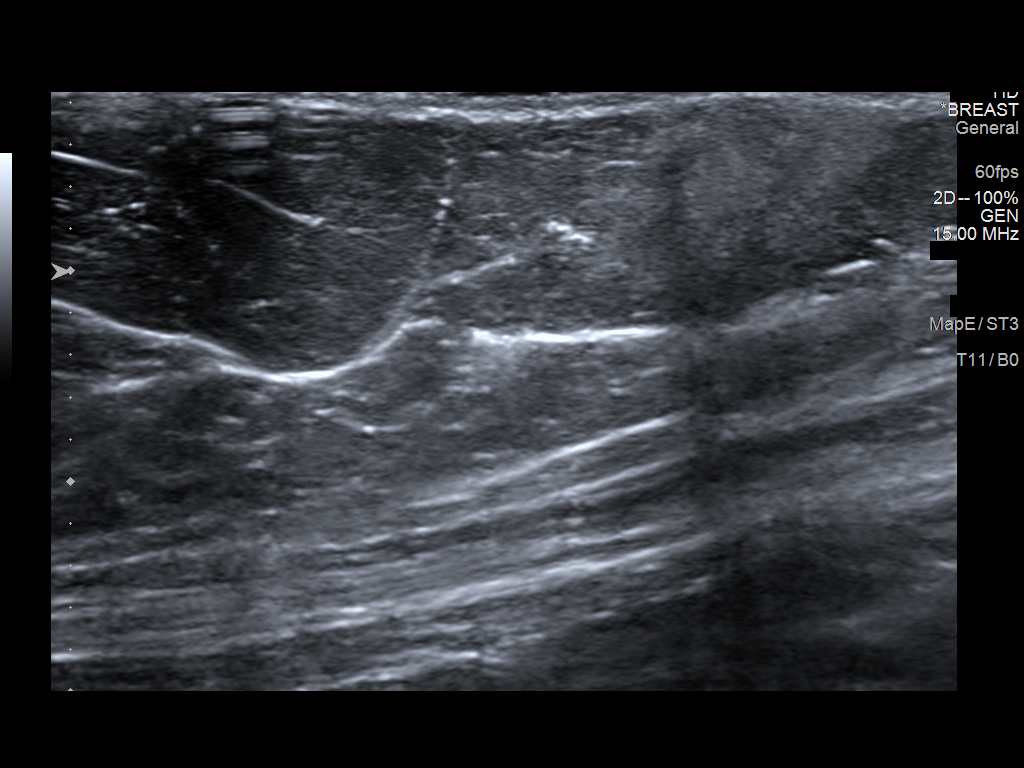
[im 3/6]
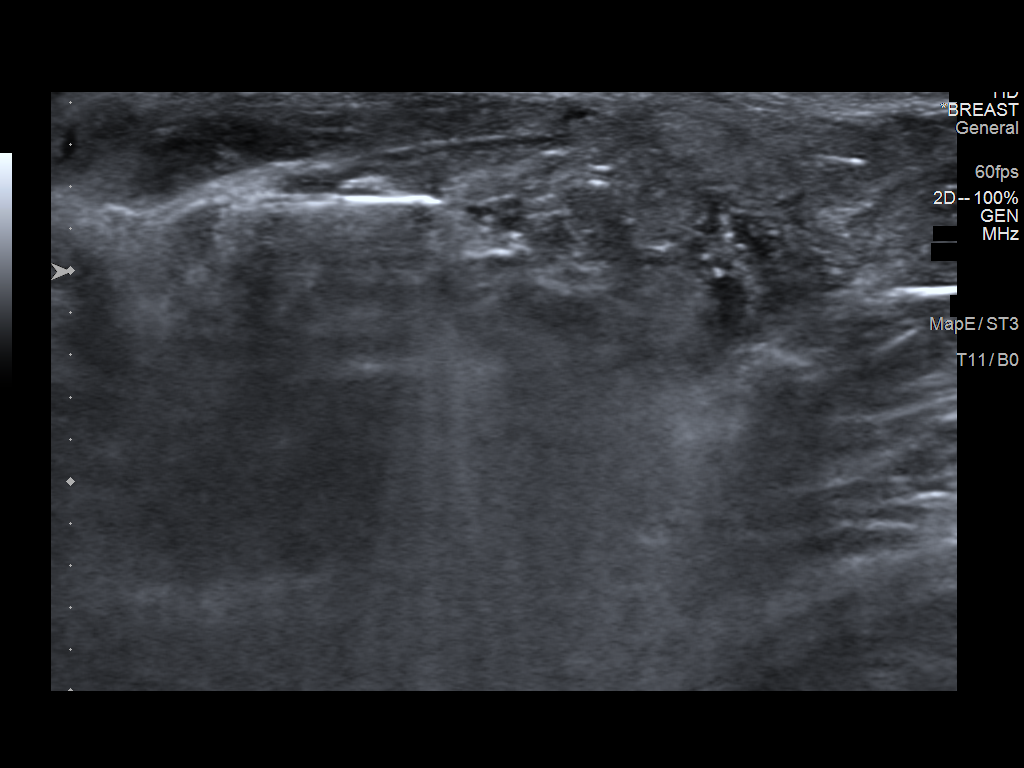
[im 4/6]
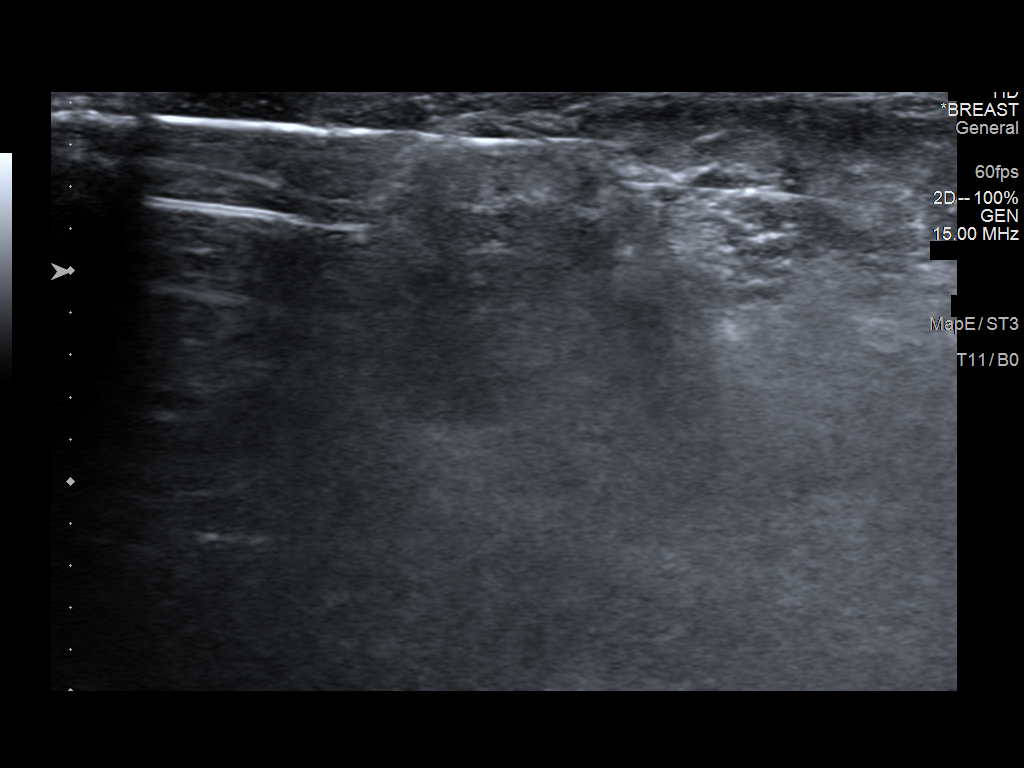
[im 5/6]
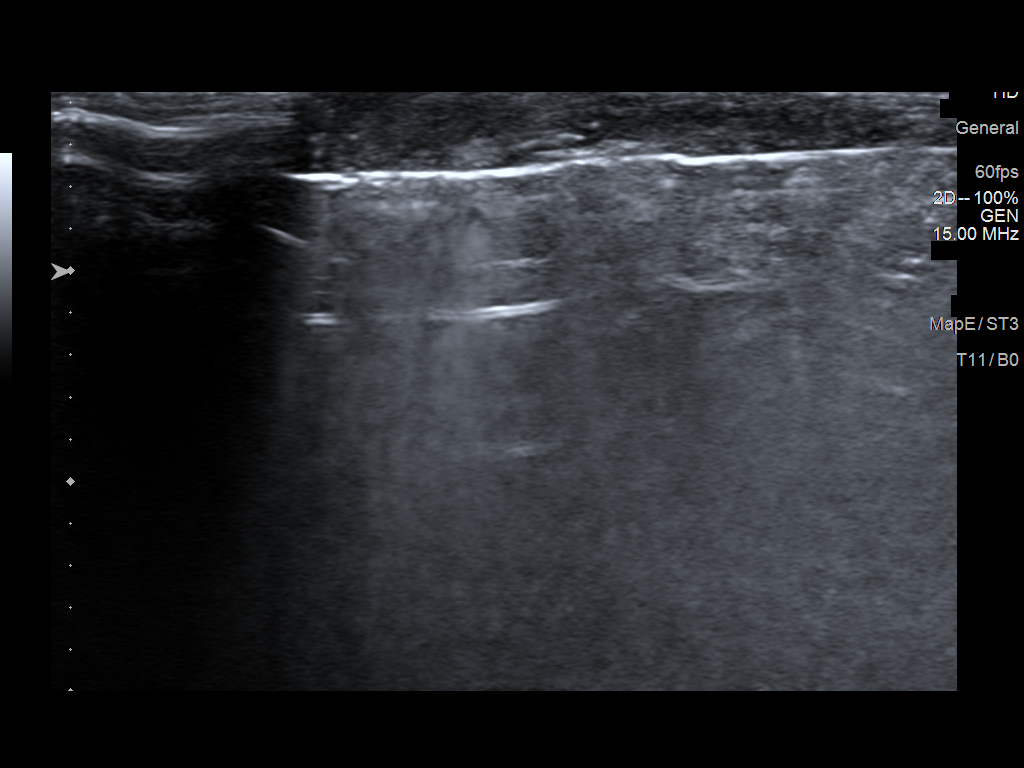
[im 6/6]
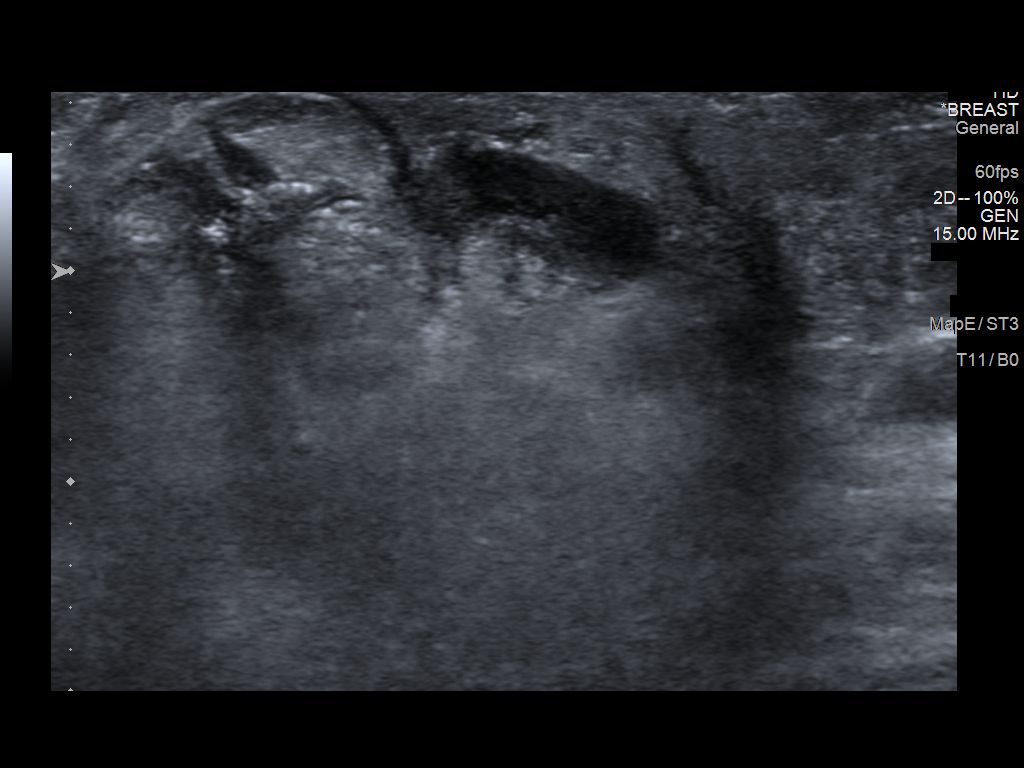

[6 of 6 positions shown; findings below may reference images not displayed]



Lesion quadrant: UPPER OUTER QUADRANT.

Using sterile technique with chlorhexidine as skin antisepsis, 1%
Lidocaine as local anesthetic, under direct ultrasound
visualization, a 12 gauge Blade Aujla core needle device placed
through an 11 gauge introducer needle was used to perform biopsy of
the superficial mass in the UPPER OUTER QUADRANT of the RIGHT breast
using an inferolateral approach.

The lesion demonstrates increased vascularity, and due to prompt
bleeding at the biopsy site, only a single core sample was obtained
prior to performing manual compression for hemostasis. For this
reason, a tissue marking clip was not placed at the biopsy site.

Hemostasis was achieved with manual compression at the biopsy site
for approximately 10 minutes. Quick clot material was used prior to
placing a bandage over the site.

The patient has a small (1-2 cm) hematoma at the biopsy site.
IMPRESSION: Ultrasound-guided core needle biopsy of an indeterminate superficial
mass with increased vascularity in the UPPER OUTER QUADRANT of the
RIGHT breast.

A small (1-2 cm) hematoma is present at the biopsy site after
achieving hemostasis.

ADDENDUM:
Pathology revealed BLOOD AND ADIPOSE TISSUE of the RIGHT breast,
11:00 o'clock, 12 cmfn, (NO CLIP PLACED due to prompt bleeding at
the biopsy site). This was found to be likely concordant by Dr.
Brunat Silem.

Pathology results were discussed with the patient by telephone. The
patient reported doing well after the biopsy with tenderness,
bruising and minimal bleeding at the site. Post biopsy instructions
and care were reviewed and questions were answered. The patient was
encouraged to call The [REDACTED] for any
additional concerns. My direct phone number was provided.

The patient was asked to return for a RIGHT breast ultrasound in 6
months and was informed a reminder notice would be sent regarding
this appointment.

Pathology results reported by Nola Oxendine, RN on 03/22/2022.



Lesion quadrant: UPPER OUTER QUADRANT.

Using sterile technique with chlorhexidine as skin antisepsis, 1%
Lidocaine as local anesthetic, under direct ultrasound
visualization, a 12 gauge Blade Aujla core needle device placed
through an 11 gauge introducer needle was used to perform biopsy of
the superficial mass in the UPPER OUTER QUADRANT of the RIGHT breast
using an inferolateral approach.

The lesion demonstrates increased vascularity, and due to prompt
bleeding at the biopsy site, only a single core sample was obtained
prior to performing manual compression for hemostasis. For this
reason, a tissue marking clip was not placed at the biopsy site.

Hemostasis was achieved with manual compression at the biopsy site
for approximately 10 minutes. Quick clot material was used prior to
placing a bandage over the site.

The patient has a small (1-2 cm) hematoma at the biopsy site.
IMPRESSION: Ultrasound-guided core needle biopsy of an indeterminate superficial
mass with increased vascularity in the UPPER OUTER QUADRANT of the
RIGHT breast.

A small (1-2 cm) hematoma is present at the biopsy site after
achieving hemostasis.

## 2023-07-11 ENCOUNTER — Telehealth (HOSPITAL_COMMUNITY): Payer: Self-pay

## 2023-07-11 DIAGNOSIS — F411 Generalized anxiety disorder: Secondary | ICD-10-CM

## 2023-07-11 NOTE — Telephone Encounter (Signed)
Medication refill - Fax from [employer] for a new Hydroxyzine 10 mg order, last provided 05/09/23 + 1 refill and pt returns next on 08/08/23.

## 2023-07-12 MED ORDER — HYDROXYZINE HCL 10 MG PO TABS
10.0000 mg | ORAL_TABLET | Freq: Three times a day (TID) | ORAL | 1 refills | Status: AC | PRN
Start: 2023-07-12 — End: ?

## 2023-07-12 NOTE — Telephone Encounter (Signed)
Received message that patient needed refill of her Hydroxyzine.  This was sent.    Sent: -Hydroxyzine 10 mg TID PRN.  45 tablets with 1 refill.    Arna Snipe MD Resident

## 2023-08-01 ENCOUNTER — Other Ambulatory Visit: Payer: Self-pay | Admitting: Family Medicine

## 2023-08-08 ENCOUNTER — Telehealth (INDEPENDENT_AMBULATORY_CARE_PROVIDER_SITE_OTHER): Payer: BC Managed Care – PPO | Admitting: Student in an Organized Health Care Education/Training Program

## 2023-08-08 ENCOUNTER — Encounter (HOSPITAL_COMMUNITY): Payer: Self-pay | Admitting: Student in an Organized Health Care Education/Training Program

## 2023-08-08 DIAGNOSIS — F99 Mental disorder, not otherwise specified: Secondary | ICD-10-CM

## 2023-08-08 DIAGNOSIS — F3131 Bipolar disorder, current episode depressed, mild: Secondary | ICD-10-CM

## 2023-08-08 DIAGNOSIS — F5105 Insomnia due to other mental disorder: Secondary | ICD-10-CM | POA: Diagnosis not present

## 2023-08-08 DIAGNOSIS — F4310 Post-traumatic stress disorder, unspecified: Secondary | ICD-10-CM

## 2023-08-08 DIAGNOSIS — F411 Generalized anxiety disorder: Secondary | ICD-10-CM | POA: Diagnosis not present

## 2023-08-08 MED ORDER — BUSPIRONE HCL 5 MG PO TABS
10.0000 mg | ORAL_TABLET | Freq: Two times a day (BID) | ORAL | 2 refills | Status: DC
Start: 2023-08-08 — End: 2023-10-02

## 2023-08-08 MED ORDER — LAMOTRIGINE 200 MG PO TABS
200.0000 mg | ORAL_TABLET | Freq: Every day | ORAL | 2 refills | Status: DC
Start: 2023-08-08 — End: 2023-11-07

## 2023-08-08 MED ORDER — MIRTAZAPINE 45 MG PO TABS
45.0000 mg | ORAL_TABLET | Freq: Every day | ORAL | 2 refills | Status: DC
Start: 2023-08-08 — End: 2023-11-07

## 2023-08-08 MED ORDER — ZOLPIDEM TARTRATE 5 MG PO TABS
5.0000 mg | ORAL_TABLET | Freq: Every evening | ORAL | 2 refills | Status: DC | PRN
Start: 2023-08-08 — End: 2023-11-07
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
## 2023-08-15 ENCOUNTER — Other Ambulatory Visit: Payer: Self-pay | Admitting: Family Medicine

## 2023-08-15 DIAGNOSIS — Z1211 Encounter for screening for malignant neoplasm of colon: Secondary | ICD-10-CM

## 2023-08-15 DIAGNOSIS — Z1212 Encounter for screening for malignant neoplasm of rectum: Secondary | ICD-10-CM

## 2023-10-02 ENCOUNTER — Ambulatory Visit: Payer: BC Managed Care – PPO | Admitting: Family Medicine

## 2023-10-02 ENCOUNTER — Encounter: Payer: Self-pay | Admitting: Family Medicine

## 2023-10-02 VITALS — BP 142/92 | HR 87 | Temp 98.3°F | Ht 68.0 in | Wt 202.4 lb

## 2023-10-02 DIAGNOSIS — E785 Hyperlipidemia, unspecified: Secondary | ICD-10-CM | POA: Diagnosis not present

## 2023-10-02 DIAGNOSIS — I10 Essential (primary) hypertension: Secondary | ICD-10-CM | POA: Diagnosis not present

## 2023-10-02 DIAGNOSIS — F17200 Nicotine dependence, unspecified, uncomplicated: Secondary | ICD-10-CM | POA: Diagnosis not present

## 2023-10-02 DIAGNOSIS — F411 Generalized anxiety disorder: Secondary | ICD-10-CM

## 2023-10-02 DIAGNOSIS — F32A Depression, unspecified: Secondary | ICD-10-CM

## 2023-10-02 MED ORDER — BUSPIRONE HCL 5 MG PO TABS
5.0000 mg | ORAL_TABLET | Freq: Every day | ORAL | Status: DC
Start: 2023-10-02 — End: 2023-11-07

## 2023-10-02 NOTE — Progress Notes (Signed)
D/w pt about smoking.  Smoking about 5 cigs per day.  Doesn't smoke at work.  She wants to quit.  Counseled and letter given to patient.   spent with smoking cessation.   Hypertension:    Using medication without problems or lightheadedness: yes Chest pain with exertion: L chest sore with cramping pain after getting bronchitis- that is getting better.  No pain with deep breath.   Edema: no Short of breath:no  HLD d/w pt.  High HDL.  D/w pt about labs.    Mood is good.  Taking buspar 5mg  at night.  Didn't increase dose- she was drowsy with AM use.  Med list updated.    On rinvoq for eczema per dermatology.   Meds, vitals, and allergies reviewed.   ROS: Per HPI unless specifically indicated in ROS section   GEN: nad, alert and oriented HEENT: mucous membranes moist NECK: supple w/o LA CV: rrr. L chest Farfan ttp, reproducible.   PULM: ctab, no inc wob ABD: soft, +bs EXT: no edema SKIN: well perfused.

## 2023-10-02 NOTE — Patient Instructions (Addendum)
Check your pressure at home at rest after urinating in the morning.  If persistently above 140/90, then let me know.   Labs today. We'll update you about that.  If LFTs are stable, then likely start a low dose of a statin.  If you have muscle aches, then let me know.   Update me as needed.  Take care.  Glad to see you.

## 2023-10-03 LAB — HEPATIC FUNCTION PANEL
ALT: 10 U/L (ref 0–35)
AST: 26 U/L (ref 0–37)
Albumin: 4.8 g/dL (ref 3.5–5.2)
Alkaline Phosphatase: 60 U/L (ref 39–117)
Bilirubin, Direct: 0.1 mg/dL (ref 0.0–0.3)
Total Bilirubin: 0.4 mg/dL (ref 0.2–1.2)
Total Protein: 7.4 g/dL (ref 6.0–8.3)

## 2023-10-05 ENCOUNTER — Other Ambulatory Visit: Payer: Self-pay | Admitting: Family Medicine

## 2023-10-05 DIAGNOSIS — E785 Hyperlipidemia, unspecified: Secondary | ICD-10-CM | POA: Insufficient documentation

## 2023-10-05 MED ORDER — ATORVASTATIN CALCIUM 10 MG PO TABS: 10.0000 mg | ORAL_TABLET | Freq: Every day | ORAL | 3 refills | Status: DC

## 2023-10-05 NOTE — Assessment & Plan Note (Signed)
See above re: counseling.  Encouraged cessation.

## 2023-10-05 NOTE — Assessment & Plan Note (Signed)
-   Continue amlodipine ?

## 2023-10-05 NOTE — Assessment & Plan Note (Signed)
Labs today. If LFTs are stable, then likely start a low dose of a statin.  If you have muscle aches, then let me know.  She is going to have f/u LFTs done later on at dermatology.

## 2023-10-05 NOTE — Assessment & Plan Note (Signed)
Improved, per outside clinic.

## 2023-11-07 ENCOUNTER — Encounter (HOSPITAL_COMMUNITY): Payer: Self-pay | Admitting: Student in an Organized Health Care Education/Training Program

## 2023-11-07 ENCOUNTER — Telehealth (INDEPENDENT_AMBULATORY_CARE_PROVIDER_SITE_OTHER): Payer: BC Managed Care – PPO | Admitting: Student in an Organized Health Care Education/Training Program

## 2023-11-07 DIAGNOSIS — F4310 Post-traumatic stress disorder, unspecified: Secondary | ICD-10-CM | POA: Diagnosis not present

## 2023-11-07 DIAGNOSIS — F3131 Bipolar disorder, current episode depressed, mild: Secondary | ICD-10-CM | POA: Diagnosis not present

## 2023-11-07 DIAGNOSIS — F5105 Insomnia due to other mental disorder: Secondary | ICD-10-CM | POA: Diagnosis not present

## 2023-11-07 DIAGNOSIS — F411 Generalized anxiety disorder: Secondary | ICD-10-CM | POA: Diagnosis not present

## 2023-11-07 MED ORDER — ZOLPIDEM TARTRATE 5 MG PO TABS: 5.0000 mg | ORAL_TABLET | Freq: Every evening | ORAL | 2 refills | Status: DC | PRN

## 2023-11-07 MED ORDER — LAMOTRIGINE 200 MG PO TABS
200.0000 mg | ORAL_TABLET | Freq: Every day | ORAL | 2 refills | Status: DC
Start: 2023-11-07 — End: 2024-01-29

## 2023-11-07 MED ORDER — MIRTAZAPINE 45 MG PO TABS: 45.0000 mg | ORAL_TABLET | Freq: Every day | ORAL | 2 refills | Status: DC

## 2023-11-07 MED ORDER — PROPRANOLOL HCL 10 MG PO TABS: 10.0000 mg | ORAL_TABLET | Freq: Two times a day (BID) | ORAL | 2 refills | Status: DC
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
## 2023-12-04 ENCOUNTER — Telehealth: Payer: Self-pay | Admitting: Family Medicine

## 2023-12-04 MED ORDER — PRAVASTATIN SODIUM 10 MG PO TABS: 10.0000 mg | ORAL_TABLET | Freq: Every day | ORAL | 3 refills | Status: DC

## 2023-12-04 MED ORDER — ATORVASTATIN CALCIUM 10 MG PO TABS: ORAL_TABLET | ORAL | Status: DC

## 2023-12-04 NOTE — Telephone Encounter (Signed)
Spoke with patient and she stated that she stopped taking the atorvastatin 2 weeks ago and the aches stopped within 24 hours. She is wanting to know if there is an alternative that you would like for her to try.

## 2023-12-04 NOTE — Addendum Note (Signed)
Addended by: [name] on: 12/04/2023 04:36 PM   Modules accepted: Orders

## 2023-12-04 NOTE — Telephone Encounter (Signed)
Copied from CRM [id number]. Topic: Clinical - Medication Question >> Dec 04, 2023  1:14 PM Irine Seal wrote: Reason for CRM: PT IS NOT CURRENTLY SYMPTOMATIC.  Pt experienced unwanted side effects of atorvastatin (LIPITOR) 10 MG tablet [[id number]]- muscle ache pains, neck pain, whole body aches. Pt stopped taking the medicaton and is wanting to discuss an alternative.  Please call pt back at [phone]

## 2023-12-04 NOTE — Telephone Encounter (Signed)
If she had aches on atorvastatin, then stop the med and the aches should resolve in the next week.  Please update [place] in about 1 week.  Thanks.

## 2023-12-04 NOTE — Telephone Encounter (Signed)
I would try pravastatin 10mg . I sent the rx to [facility].  She may be able to tolerate that w/o aches.  If she has aches, then stop it and let me know. I appreciate her effort.

## 2023-12-04 NOTE — Addendum Note (Signed)
Addended by: [name] on: 12/04/2023 01:52 PM   Modules accepted: Orders

## 2023-12-05 NOTE — Telephone Encounter (Signed)
Spoke with patient advised that rx has been sent to pharmacy but to let us know if she develops aches and to stop the medication. Patient verbalized understanding.

## 2024-01-28 ENCOUNTER — Telehealth (HOSPITAL_COMMUNITY): Payer: Self-pay | Admitting: Student in an Organized Health Care Education/Training Program

## 2024-01-28 DIAGNOSIS — F5105 Insomnia due to other mental disorder: Secondary | ICD-10-CM

## 2024-01-28 DIAGNOSIS — F411 Generalized anxiety disorder: Secondary | ICD-10-CM

## 2024-01-28 DIAGNOSIS — F3131 Bipolar disorder, current episode depressed, mild: Secondary | ICD-10-CM

## 2024-01-28 DIAGNOSIS — F4310 Post-traumatic stress disorder, unspecified: Secondary | ICD-10-CM

## 2024-01-29 MED ORDER — MIRTAZAPINE 45 MG PO TABS: 45.0000 mg | ORAL_TABLET | Freq: Every day | ORAL | 3 refills | Status: DC

## 2024-01-29 MED ORDER — LAMOTRIGINE 200 MG PO TABS: 200.0000 mg | ORAL_TABLET | Freq: Every day | ORAL | 3 refills | Status: DC

## 2024-01-29 MED ORDER — PROPRANOLOL HCL 10 MG PO TABS: 10.0000 mg | ORAL_TABLET | Freq: Two times a day (BID) | ORAL | 3 refills | Status: DC

## 2024-01-29 NOTE — Telephone Encounter (Signed)
 Received message that patient needed refills of her medications as she will run out prior to her next appointment on 4/4.  Refills sent in.   Sent: -Lamictal 200 mg daily.  30 tablets with 3 refills. -Remeron 45 mg QHS.  30 tablets with 3 refills. -Propanolol 10 mg BID.  60 tablets with 3 refills.    Arna Snipe MD Resident

## 2024-02-04 ENCOUNTER — Other Ambulatory Visit: Payer: Self-pay | Admitting: Family Medicine

## 2024-02-13 ENCOUNTER — Telehealth (INDEPENDENT_AMBULATORY_CARE_PROVIDER_SITE_OTHER): Payer: Self-pay | Admitting: Student in an Organized Health Care Education/Training Program

## 2024-02-13 ENCOUNTER — Encounter (HOSPITAL_COMMUNITY): Payer: Self-pay | Admitting: Student in an Organized Health Care Education/Training Program

## 2024-02-13 DIAGNOSIS — F3131 Bipolar disorder, current episode depressed, mild: Secondary | ICD-10-CM | POA: Diagnosis not present

## 2024-02-13 DIAGNOSIS — F4310 Post-traumatic stress disorder, unspecified: Secondary | ICD-10-CM

## 2024-02-13 DIAGNOSIS — F5105 Insomnia due to other mental disorder: Secondary | ICD-10-CM | POA: Diagnosis not present

## 2024-02-13 DIAGNOSIS — F411 Generalized anxiety disorder: Secondary | ICD-10-CM

## 2024-02-13 MED ORDER — ZOLPIDEM TARTRATE 5 MG PO TABS
5.0000 mg | ORAL_TABLET | Freq: Every day | ORAL | 2 refills | Status: DC
Start: 2024-02-13 — End: 2024-03-29

## 2024-02-13 MED ORDER — MIRTAZAPINE 45 MG PO TABS: 45.0000 mg | ORAL_TABLET | Freq: Every day | ORAL | 3 refills | Status: DC

## 2024-02-13 MED ORDER — LAMOTRIGINE 200 MG PO TABS: 200.0000 mg | ORAL_TABLET | Freq: Every day | ORAL | 3 refills | Status: DC

## 2024-02-13 MED ORDER — PROPRANOLOL HCL 10 MG PO TABS
10.0000 mg | ORAL_TABLET | Freq: Two times a day (BID) | ORAL | 3 refills | Status: DC
Start: 2024-02-13 — End: 2024-03-25
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
## 2024-02-20 ENCOUNTER — Telehealth: Payer: Self-pay

## 2024-02-20 NOTE — Telephone Encounter (Signed)
 lvm for pt to call office to change appt to office visit

## 2024-02-20 NOTE — Telephone Encounter (Signed)
 This patient is scheduled to see Dr. Para March on 4/14 as a video visit. Please contact patient to schedule her for an in office appointment. Thank you

## 2024-02-23 ENCOUNTER — Ambulatory Visit: Admitting: Family Medicine

## 2024-02-23 NOTE — Telephone Encounter (Signed)
 LVM for patient to cb and change appt to in office.

## 2024-02-23 NOTE — Telephone Encounter (Signed)
 LVM and sent mychart message advising patient that her appt will be changed from video visit to in person. If that does not work for her then she would need to call to cancel and reschedule appointment.

## 2024-02-24 ENCOUNTER — Ambulatory Visit: Admitting: Family Medicine

## 2024-02-24 VITALS — BP 143/72 | HR 120 | Temp 98.3°F | Ht 68.0 in | Wt 201.5 lb

## 2024-02-24 DIAGNOSIS — H00033 Abscess of eyelid right eye, unspecified eyelid: Secondary | ICD-10-CM | POA: Diagnosis not present

## 2024-02-24 DIAGNOSIS — I10 Essential (primary) hypertension: Secondary | ICD-10-CM | POA: Diagnosis not present

## 2024-02-24 MED ORDER — DOXYCYCLINE HYCLATE 100 MG PO TABS: 100.0000 mg | ORAL_TABLET | Freq: Two times a day (BID) | ORAL | 0 refills | Status: DC

## 2024-02-24 NOTE — Patient Instructions (Addendum)
 The lump on your eyelid has features of abscess/infection  Given your history of MRSA in the past-please take doxycycline as directed Keep area very clean with soap and water (J and J baby shampoo)  Don't wear makeup   Do your warm compresses when you can   Update if not starting to improve in a week or if worsening   If not improved I will refer you to an eye specialist

## 2024-02-24 NOTE — Progress Notes (Signed)
 Subjective:    Patient ID: Tricia [name], female    DOB: Jul 31, 1966, 58 y.o.   MRN: [id number]  HPI  Wt Readings from Last 3 Encounters:  02/24/24 201 lb 8 oz (91.4 kg)  10/02/23 202 lb 6.4 oz (91.8 kg)  02/11/23 202 lb (91.6 kg)   30.64 kg/m  Vitals:   02/24/24 1210 02/24/24 1234  BP: (!) 156/84 (!) 143/72  Pulse: (!) 120   Temp: 98.3 F (36.8 C)   SpO2: 97%    58 yo pt of Dr Para March presents for eye lid bump Right lower lid   Has lump on inner lower eye lid  Started as a little red bump /then got bigger Using hot compresses   Irritated Itchy  Tender to the touch -trying to keep hands off  Not throbbing  Vision is not affected in that eye   No eye drops or eye conditions Eye makeup is old  Does not use often   Had mrsa in the past   Pt takes rinvoq for eczema        Patient Active Problem List   Diagnosis Date Noted   Eyelid abscess, right 02/24/2024   HLD (hyperlipidemia) 10/05/2023   HTN (hypertension) 02/12/2023   Dysuria 02/12/2023   Olecranon bursitis of left elbow    Mania (HCC)    Bipolar affective disorder, manic, moderate (HCC) 08/17/2021   Rash 08/17/2021   Anxiety    Depression    Alcohol abuse    Unilateral hearing loss 08/23/2020   Bipolar affective disorder, current episode mild (HCC) 08/23/2020   Smoker 2-3 cpd 06/12/2020   PTSD (post-traumatic stress disorder) 07/31/2015   Interstitial cystitis 12/17/2013   Insomnia 08/02/2008   Past Medical History:  Diagnosis Date   Abnormal MRI, cervical spine 08/2010   with uncovertebral disease on the left at C6-7 encroaching on the left C7 root.   Angioedema 12/2012   ? food allergy - ED eval   Anxiety    Panic   Arrhythmia    Bipolar 1 disorder (HCC)    Cardiomyopathy    EF 45% by myoview, 45-50% by echo, 50% by MRI. ? PVC-associated cardiomyopathy   Chest pain    ETT myoview 4/11) 6'15", frequent PVCs and run NSVT, mild global HK with EF 45%, fixed anterior defect was likely  soft tissue attenuation so no evidence for ischemia or infarction.   Depression    suicidal thoughts 2013, with [facility] eval   Herpes simplex virus (HSV) infection    15 years ago   HTN (hypertension) 02/12/2023   Insomnia    Interstitial cystitis    per [facility]   Irritable bowel syndrome with diarrhea    Premature ventricular contractions    resolved with metoprolol   Toe fracture, right 11/04/2011   2,3rd toes   Vaginal Pap smear, abnormal    Dysplasia 1994   Past Surgical History:  Procedure Laterality Date   BACK SURGERY     C6-7 fusion  2011   Dr. [name]   COLONOSCOPY  05/07/2013   CYSTO WITH HYDRODISTENSION  11/29/2011   Procedure: CYSTOSCOPY/HYDRODISTENSION;  Surgeon: Kathi Ludwig, MD;  Location: [facility];  Service: Urology;  Laterality: N/A;  M & P    GANGLION CYST EXCISION  2011   Dr. Teressa Senter   Social History   Tobacco Use   Smoking status: Some Days    Current packs/day: 0.50    Types: Cigarettes   Smokeless tobacco:  Never  Substance Use Topics   Alcohol use: Yes    Alcohol/week: 6.0 standard drinks of alcohol    Types: 4 Shots of liquor, 2 Glasses of wine per week   Drug use: No   Family History  Problem Relation Age of Onset   Heart disease Maternal Grandfather 107       CABG   Heart disease Cousin        Aortic dissection   Alcohol abuse Cousin    Alcohol abuse Maternal Uncle    Alcohol abuse Paternal Uncle    Alcohol abuse Paternal Grandfather    Cancer Maternal Grandmother        Ovarian   Allergies  Allergen Reactions   Atorvastatin     aches   Bentyl [Dicyclomine Hcl]     tingling   Current Outpatient Medications on File Prior to Visit  Medication Sig Dispense Refill   amLODipine (NORVASC) 5 MG tablet TAKE 1 TABLET (5 MG TOTAL) BY MOUTH DAILY. 90 tablet 1   lamoTRIgine (LAMICTAL) 200 MG tablet Take 1 tablet (200 mg total) by mouth daily. 30 tablet 3   mirtazapine (REMERON) 45 MG tablet Take 1 tablet (45 mg  total) by mouth at bedtime. 30 tablet 3   pravastatin (PRAVACHOL) 10 MG tablet Take 1 tablet (10 mg total) by mouth daily. 90 tablet 3   propranolol (INDERAL) 10 MG tablet Take 1 tablet (10 mg total) by mouth 2 (two) times daily. 60 tablet 3   RINVOQ 15 MG TB24 Take 1 tablet by mouth daily.     zolpidem (AMBIEN) 5 MG tablet Take 1 tablet (5 mg total) by mouth at bedtime. for sleep 30 tablet 2   No current facility-administered medications on file prior to visit.    Review of Systems  Constitutional:  Negative for fatigue and fever.  Eyes:  Negative for photophobia, pain, discharge, redness, itching and visual disturbance.  Skin:        Lesion on eyelid        Objective:   Physical Exam Constitutional:      General: She is not in acute distress.    Appearance: Normal appearance. She is obese. She is not ill-appearing or diaphoretic.  HENT:     Head: Normocephalic and atraumatic.     Mouth/Throat:     Mouth: Mucous membranes are moist.  Eyes:     General: No scleral icterus.       Right eye: No discharge.        Left eye: No discharge.     Extraocular Movements: Extraocular movements intact.     Conjunctiva/sclera: Conjunctivae normal.     Pupils: Pupils are equal, round, and reactive to light.     Comments: 0.5 cm smooth erythematous white domed lump on edge of right lower lid medially   Very slightly tender  Not draining Not fluctuant and minimally mobile  Firm in consistency     Neurological:     Mental Status: She is alert.     Cranial Nerves: No cranial nerve deficit.  Psychiatric:        Mood and Affect: Mood normal.           Assessment & Plan:   Problem List Items Addressed This Visit       Cardiovascular and Mediastinum   HTN (hypertension)   Blood pressure better on 2nd check today  BP: (!) 143/72   Encouraged to monitor at home Taking amlodipine 5 mg daily  Reviewed  last pcp note  Encouraged smoking cessation          Other   Eyelid  abscess, right - Primary   Erythematous lump on medial r lower lid  Location and inflamed appearance give appearance of hordeolum , but size and firmness suggest chalazion  Regardless- mild infection is possible  Pt takes rinvoq for eczema - may be immunocompromised  Remote history of mrsa resistant to sulfa antibiotic Will treatment with oral doxycycline  Continue warm compresses  Watch for increase in size / redness/drainage or any vision change  Keep clean / see AVS for care suggestions   If not improved in 1 week -consider oph referral for possible drainage or removal  Call back and Er precautions noted in detail today

## 2024-02-24 NOTE — Assessment & Plan Note (Signed)
 Blood pressure better on 2nd check today  BP: (!) 143/72   Encouraged to monitor at home Taking amlodipine 5 mg daily  Reviewed last pcp note  Encouraged smoking cessation

## 2024-02-24 NOTE — Assessment & Plan Note (Addendum)
 Erythematous lump on medial r lower lid  Location and inflamed appearance give appearance of hordeolum , but size and firmness suggest chalazion  Regardless- mild infection is possible  Pt takes rinvoq for eczema - may be immunocompromised  Remote history of mrsa resistant to sulfa antibiotic Will treatment with oral doxycycline  Continue warm compresses  Watch for increase in size / redness/drainage or any vision change  Keep clean / see AVS for care suggestions   If not improved in 1 week -consider oph referral for possible drainage or removal  Call back and Er precautions noted in detail today

## 2024-03-23 ENCOUNTER — Telehealth (HOSPITAL_COMMUNITY): Payer: Self-pay

## 2024-03-23 DIAGNOSIS — F5105 Insomnia due to other mental disorder: Secondary | ICD-10-CM

## 2024-03-23 DIAGNOSIS — F4310 Post-traumatic stress disorder, unspecified: Secondary | ICD-10-CM

## 2024-03-23 DIAGNOSIS — F3131 Bipolar disorder, current episode depressed, mild: Secondary | ICD-10-CM

## 2024-03-23 DIAGNOSIS — F411 Generalized anxiety disorder: Secondary | ICD-10-CM

## 2024-03-23 NOTE — Telephone Encounter (Signed)
 Hello, Patient called today concerning about meds, she would like all meds to be continued at [facility] NOT [facility].   Pt states that she is unsure whether she needs refill on Psych meds since there was confusion about where to send meds at.   JNL

## 2024-03-25 MED ORDER — MIRTAZAPINE 45 MG PO TABS: 45.0000 mg | ORAL_TABLET | Freq: Every day | ORAL | 0 refills | Status: DC

## 2024-03-25 MED ORDER — PROPRANOLOL HCL 10 MG PO TABS: 10.0000 mg | ORAL_TABLET | Freq: Two times a day (BID) | ORAL | 0 refills | Status: DC

## 2024-03-25 MED ORDER — LAMOTRIGINE 200 MG PO TABS: 200.0000 mg | ORAL_TABLET | Freq: Every day | ORAL | 0 refills | Status: DC

## 2024-03-25 NOTE — Telephone Encounter (Signed)
 Received message that patient would like all prescriptions sent to [place].  She has a follow up appointment on 6/13 so will send in a one month refill of her medications.   Sent: -Continue Lamictal  200 mg daily for mood stability.  30 tablets with 0 refills. -Continue Remeron  45 mg QHS for depression, anxiety, and insomnia.  30 tablets with 0 refills. -Continue Propranolol  10 mg BID for anxiety.  60 tablets with 0 refills.    Rainell Buoy MD Resident

## 2024-03-25 NOTE — Addendum Note (Signed)
 Addended by: Konica Stankowski S on: 03/25/2024 01:53 PM   Modules accepted: Orders

## 2024-03-29 MED ORDER — ZOLPIDEM TARTRATE 5 MG PO TABS: 5.0000 mg | ORAL_TABLET | Freq: Every day | ORAL | 0 refills | Status: DC

## 2024-03-29 NOTE — Telephone Encounter (Signed)
 All other prescriptions had been sent to new pharmacy [facility].  Sent a one month refill of Ambien  to [place].  Sent: Ambien  5 mg QHS.  30 tablets with 0 refills    Rainell Buoy MD Resident

## 2024-03-29 NOTE — Telephone Encounter (Addendum)
 Hey Dr. Arlester Ladd patient called today stating that she only received 15 pills of  Ambien , and would like this to be filled at [facility]. Pt would like a phone call because she is highly confused and out of meds.    Under the meds it doesn't list [facility]. Is there anyway that you could help assist possibly? She states pharmacy overrode her 15 days of meds not full month.   JNL

## 2024-03-29 NOTE — Addendum Note (Signed)
 Addended by: Raoul Ciano S on: 03/29/2024 04:09 PM   Modules accepted: Orders

## 2024-04-06 NOTE — Telephone Encounter (Signed)
 Hello Dr. Arlester Ladd I am speaking to Miss Tricia [name] again at the moment, she would greatly like a call back about inquires with her medications. She believes she is having side affects without being with out, I am not sure how I can father assist as I can't fell the meds she is asking for. Pt would like a direct phone call from number on file.

## 2024-04-08 ENCOUNTER — Telehealth (HOSPITAL_COMMUNITY): Payer: Self-pay

## 2024-04-08 NOTE — Telephone Encounter (Signed)
 Called and spoke to patient she is all good until next upcoming app with Dr. Charlie Constant. I asked pharmacy if they could Take off override that was placed not sure as they said it was due to how script was written. But moving forth on pt would like samples to be sent to [facility]. Thanks     JNL

## 2024-04-08 NOTE — Telephone Encounter (Deleted)
 Patient has called again and is stating she is out of her Ambien , she is highly upset and has calling for 2 weeks as noted. Please call back when permitted, not sure if another provider can help so routing this call incase, but pt is very upset about this.    JNL

## 2024-04-08 NOTE — Telephone Encounter (Signed)
 Documented in Sep encounter.

## 2024-04-10 NOTE — Telephone Encounter (Signed)
 She reports that there was an issue with refilling her Ambien  and so was without it for approximately 2 weeks and that her sleep did significantly suffer.  She reports that she did get a 15 day supply and so will have enough to make to her follow up appointment on 6/13.  Discussed with her that refills would be sent in on 6/9 so that they could be mailed to her by 6/13 and not have any disruption and she was agreeable.  She reports that she has not had any significant changes other than the sleep, she reports her mood has remained stable. She reports no SI, HI, or AVH.  She reports no other concerns at present.   Plan: Will send in refills of her medications on 6/9 prior to her next appointment on 6/13.   Rainell Buoy MD Resident

## 2024-04-19 ENCOUNTER — Other Ambulatory Visit: Payer: Self-pay | Admitting: Student in an Organized Health Care Education/Training Program

## 2024-04-19 ENCOUNTER — Telehealth: Payer: Self-pay | Admitting: Student in an Organized Health Care Education/Training Program

## 2024-04-19 DIAGNOSIS — F411 Generalized anxiety disorder: Secondary | ICD-10-CM

## 2024-04-19 DIAGNOSIS — F4310 Post-traumatic stress disorder, unspecified: Secondary | ICD-10-CM

## 2024-04-19 DIAGNOSIS — F3131 Bipolar disorder, current episode depressed, mild: Secondary | ICD-10-CM

## 2024-04-19 DIAGNOSIS — F5105 Insomnia due to other mental disorder: Secondary | ICD-10-CM

## 2024-04-19 MED ORDER — MIRTAZAPINE 45 MG PO TABS
45.0000 mg | ORAL_TABLET | Freq: Every day | ORAL | 2 refills | Status: DC
Start: 2024-04-19 — End: 2024-07-01

## 2024-04-19 MED ORDER — PROPRANOLOL HCL 10 MG PO TABS
10.0000 mg | ORAL_TABLET | Freq: Two times a day (BID) | ORAL | 2 refills | Status: DC
Start: 2024-04-19 — End: 2024-07-01

## 2024-04-19 MED ORDER — ZOLPIDEM TARTRATE 5 MG PO TABS: 5.0000 mg | ORAL_TABLET | Freq: Every day | ORAL | 2 refills | Status: DC

## 2024-04-19 MED ORDER — LAMOTRIGINE 200 MG PO TABS: 200.0000 mg | ORAL_TABLET | Freq: Every day | ORAL | 2 refills | Status: DC

## 2024-04-19 NOTE — Telephone Encounter (Signed)
 As per conversation with patient on 5/31 sent in refills to her pharmacy since they are mailed to her and she would run out prior to her next appointment on 6/13.  [employer], [phone], and added 3 refills to her existing prescriptions.   Called in: -Continue Lamictal  200 mg daily for mood stability.  30 tablets with 2 refills. -Continue Remeron  45 mg QHS for depression, anxiety, and insomnia.  30 tablets with 2 refills. -Continue Propranolol  10 mg BID for anxiety.  60 tablets with 2 refills. -Continue Ambien  5 mg QHS for insomnia. 30 tablets with 2 refills.     Rainell Buoy MD Resident

## 2024-04-19 NOTE — Telephone Encounter (Deleted)
 As discussed with patient on 5/31 will send in refills of patients medications as she does use a delivery service and would run out of her medications prior to her appointment on 6/13.  Sent: -Continue Lamictal  200 mg daily for mood stability.  30 tablets with 2 refills. -Continue Remeron  45 mg QHS for depression, anxiety, and insomnia.  30 tablets with 2 refills. -Continue Propranolol  10 mg BID for anxiety.  60 tablets with 2 refills -Continue Ambien  5 mg QHS for insomnia.  30 tablets with 2 refills.    Rainell Buoy MD Resident

## 2024-04-23 ENCOUNTER — Telehealth (INDEPENDENT_AMBULATORY_CARE_PROVIDER_SITE_OTHER): Payer: Self-pay | Admitting: Student in an Organized Health Care Education/Training Program

## 2024-04-23 ENCOUNTER — Encounter (HOSPITAL_COMMUNITY): Payer: Self-pay | Admitting: Student in an Organized Health Care Education/Training Program

## 2024-04-23 DIAGNOSIS — F411 Generalized anxiety disorder: Secondary | ICD-10-CM | POA: Diagnosis not present

## 2024-04-23 DIAGNOSIS — F4310 Post-traumatic stress disorder, unspecified: Secondary | ICD-10-CM | POA: Diagnosis not present

## 2024-04-23 DIAGNOSIS — F3131 Bipolar disorder, current episode depressed, mild: Secondary | ICD-10-CM

## 2024-04-23 DIAGNOSIS — F5105 Insomnia due to other mental disorder: Secondary | ICD-10-CM

## 2024-04-23 DIAGNOSIS — F99 Mental disorder, not otherwise specified: Secondary | ICD-10-CM

## 2024-04-23 MED ORDER — HYDROXYZINE HCL 10 MG PO TABS: 5.0000 mg | ORAL_TABLET | Freq: Every evening | ORAL | 2 refills | Status: DC | PRN
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
## 2024-05-17 ENCOUNTER — Encounter: Payer: Self-pay | Admitting: Student in an Organized Health Care Education/Training Program

## 2024-05-17 ENCOUNTER — Telehealth (HOSPITAL_COMMUNITY): Payer: Self-pay

## 2024-05-17 DIAGNOSIS — F5105 Insomnia due to other mental disorder: Secondary | ICD-10-CM

## 2024-05-17 MED ORDER — ZOLPIDEM TARTRATE 5 MG PO TABS: 5.0000 mg | ORAL_TABLET | Freq: Every day | ORAL | 1 refills | Status: DC

## 2024-05-17 NOTE — Telephone Encounter (Signed)
 Patient's medications reviewed, discussed case with Dr. Raliegh, who is in agreement for refill of ambien  5 mg nightly (1 refill) to bridge patient until she is formally evaluated by me in August (appointment 07/01/2024).   Karam Dunson Carrin Carrero, MD PGY-3, [facility], [facility] Psychiatry

## 2024-05-17 NOTE — Telephone Encounter (Signed)
 Patient called stating she had no refills on her Zolpidem  10mg .  [facility] Staff reached out to [facility]. Pharmacy Staff informed that medication was last filled on 04/20/24 and there were no refills .

## 2024-05-18 ENCOUNTER — Telehealth (HOSPITAL_COMMUNITY): Payer: Self-pay

## 2024-05-18 MED ORDER — ZOLPIDEM TARTRATE 5 MG PO TABS
5.0000 mg | ORAL_TABLET | Freq: Every day | ORAL | 1 refills | Status: DC
Start: 2024-05-18 — End: 2024-07-01

## 2024-05-18 NOTE — Telephone Encounter (Signed)
 Hello,   Pt has been calling for some time now regarding meds. Pt does not have a recorded pharmacy under where it says E-Prescribed for her Ambien  called pharmacy they do not have scripts either can someone please advise.   JNL

## 2024-05-18 NOTE — Addendum Note (Signed)
 Addended by: CARRION CARRERO, MARLO on: 05/18/2024 02:13 PM   Modules accepted: Orders

## 2024-07-01 ENCOUNTER — Encounter (HOSPITAL_COMMUNITY): Payer: Self-pay | Admitting: Student in an Organized Health Care Education/Training Program

## 2024-07-01 ENCOUNTER — Ambulatory Visit (INDEPENDENT_AMBULATORY_CARE_PROVIDER_SITE_OTHER): Admitting: Student in an Organized Health Care Education/Training Program

## 2024-07-01 VITALS — BP 154/97 | HR 81 | Ht 68.0 in | Wt 205.5 lb

## 2024-07-01 DIAGNOSIS — F99 Mental disorder, not otherwise specified: Secondary | ICD-10-CM

## 2024-07-01 DIAGNOSIS — F4310 Post-traumatic stress disorder, unspecified: Secondary | ICD-10-CM

## 2024-07-01 DIAGNOSIS — F3131 Bipolar disorder, current episode depressed, mild: Secondary | ICD-10-CM | POA: Diagnosis not present

## 2024-07-01 DIAGNOSIS — F17200 Nicotine dependence, unspecified, uncomplicated: Secondary | ICD-10-CM

## 2024-07-01 DIAGNOSIS — F5105 Insomnia due to other mental disorder: Secondary | ICD-10-CM | POA: Diagnosis not present

## 2024-07-01 DIAGNOSIS — F411 Generalized anxiety disorder: Secondary | ICD-10-CM | POA: Diagnosis not present

## 2024-07-01 MED ORDER — PROPRANOLOL HCL 10 MG PO TABS: 10.0000 mg | ORAL_TABLET | Freq: Two times a day (BID) | ORAL | 2 refills | Status: DC | PRN

## 2024-07-01 MED ORDER — MIRTAZAPINE 45 MG PO TABS: 45.0000 mg | ORAL_TABLET | Freq: Every day | ORAL | 2 refills | Status: DC

## 2024-07-01 MED ORDER — ZOLPIDEM TARTRATE 5 MG PO TABS: 5.0000 mg | ORAL_TABLET | Freq: Every day | ORAL | 1 refills | Status: DC

## 2024-07-01 MED ORDER — LAMOTRIGINE 200 MG PO TABS: 200.0000 mg | ORAL_TABLET | Freq: Every day | ORAL | 2 refills | Status: DC
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
## 2024-07-01 NOTE — Patient Instructions (Signed)
 Dear Tricia [name],  Most effective treatment for your mental health disease involves BOTH a psychiatrist AND a therapist Psychiatrist to manage medications Therapist to help identify personal goals, barriers from those goals, and plan to achieve those goals by understanding emotions Please make regular appointments with an outpatient psychiatrist and other doctors once you leave the hospital (if any, otherwise, please see below for resources to make an appointment).  For therapy outside the hospital, please ask for these specific types of therapy: DBT ________________________________________________________  SAFETY CRISIS  Dial 988 for National Suicide & Crisis Lifeline    Text [phone] for Crisis Text Line:     [facility] U[facility]ENT [facility]:  [address] FIRST FLOOR.  [place], [place] [address].  [phone]  Mobile Crisis Response Teams Listed by counties in vicinity of [facility] providers [facility] [phone] [facility] [phone] [facility] [phone] [facility] [phone] [address]                * [facility] [phone]                * [facility] [phone] [facility] [phone] [facility]  [phone] * [facility] [phone] ________________________________________________________  To see which pharmacy near you is the CHEAPEST for certain medications, please use GoodRx. It is free website and has a free phone app.    Also consider looking at [facility] $4.00 or [facility]'s $7.00 prescription list. Both are free to view if googled [facility]4 prescription and [facility]'s $7 prescription. These are set prices, no insurance required. [facility] low cost medications: $4-$15 for 30days  prescriptions or $10-$38 for 90days prescriptions  ________________________________________________________  Difficulties with sleep?   Can also use this free app for insomnia called CBT-I. Let your doctors and therapists know so they can help with extra tips and tricks or for guidance and accountability. NO ADDS on the app.     ________________________________________________________  Non-Emergent / Urgent  [facility] [address] SECOND FLOOR [place], [place] [address] [phone] OUTPATIENT Walk-in information: Please note, all walk-ins are first come & first serve, with limited number of availability.  Please note that to be eligible for services you must bring: ID or a piece of mail with your name [facility] address  Therapist for therapy:  Monday & Wednesdays: Please ARRIVE at 7:15 AM for registration Will START at 8:00 AM Every 1st & 2nd Friday of the month: Please ARRIVE at 10:15 AM for registration Will START at 1 PM - 5 PM  Psychiatrist for medication management: Monday - Friday:  Please ARRIVE at 7:15 AM for registration Will START at 8:00 AM  Regretfully, due to limited availability, please be aware that you may not been seen on the same day as walk-in. Please consider making an appoint or try again. Thank you for your patience and understanding.

## 2024-07-25 NOTE — Progress Notes (Signed)
 "    Monico Sudduth T. Sher Hellinger, MD, CAQ Sports Medicine [facility] [address] [place], [address]  Phone: [phone]  FAX: [phone]  Tricia [name] - 58 y.o. female  MRN [id number]  Date of Birth: 25-Aug-1966  Date: 07/28/2024  PCP: Cleatus Arlyss RAMAN, MD  Referral: Cleatus Arlyss RAMAN, MD  Chief Complaint  Patient presents with   Finger Injury    Right Pinky Hurt playing kickball on 1 1/2 weeks ago   Subjective:   [name] is a 58 y.o. very pleasant female patient with Body mass index is 31.09 kg/m. who presents with the following:  Discussed the use of AI scribe software for clinical note transcription with the patient, who gave verbal consent to proceed.  She presents with some ongoing finger pain after finger struck a ball on Saturday, July 17, 2024. History of Present Illness AUBRIAUNA [name] is a 58 year old female who presents with inability to extend her right pinky finger after a kickball injury.  Approximately 10 days ago, she sustained an injury to her right pinky finger while playing kickball with her nephew. The injury involved a direct impact to the finger, resulting in an inability to fully extend it. She reports that she can move the finger slightly in other directions but cannot extend it.  She experiences minimal pain, primarily at night when she rolls over in her sleep. 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 There is no significant pain during the day, and she has not noticed any swelling or bruising.  She has not sought prior medical evaluation or treatment for this issue before today's visit.    Review of Systems is noted in the HPI, as appropriate  Objective:   BP 130/70   Pulse (!) 101   Temp 98.8 F (37.1 C) (Temporal)   Ht 5' 8 (1.727 m)   Wt 204 lb 8 oz (92.8 kg)   LMP 05/25/2020 (Exact Date)   SpO2 99%   BMI  31.09 kg/m   GEN: No acute distress; alert,appropriate. PULM: Breathing comfortably in no respiratory distress PSYCH: Normally interactive.   Physical Exam MUSCULOSKELETAL: Right little finger mallet finger with inability to extend at the distal joint. Strength intact on downward push.  The remainder of the bony, ligamentous, tendinous structures of the hand are entirely normal on the right  Laboratory and Imaging Data:  Assessment and Plan:     ICD-10-CM   1. Mallet deformity of right little finger  M20.011 DG Finger Little Right    2. Finger pain, right  M79.644 DG Finger Little Right     Assessment & Plan Mallet finger of right little finger Inability to extend distal phalanx suggests tendon injury or avulsion fracture. Minimal pain reported. - Order x-ray of right little finger to assess for fracture. - Advise use of splint to immobilize distal interphalangeal joint.  She was instructed to keep the DIP joint in extension entirely for 6 to 8 weeks.  She should keep her splint on at all times.  Even in the shower I would like for her to keep the hand and DIP joint fully in extension.  She indicated that she understood this.  Medication Management during today's office visit: No orders of the defined types were placed in this encounter.  Medications Discontinued During This Encounter  Medication Reason   RINVOQ  15 MG TB24 Dose change    Orders placed today for conditions managed today: Orders Placed This Encounter  Procedures   DG Finger Little Right    Disposition: Return for Dr. Watt, R pinky mallet finger.  Dragon [employer] One speech-to-text software was used for transcription in this dictation.  Possible transcriptional errors can occur using Animal nutritionist.   Signed,  Jacques DASEN. Garold Sheeler, MD   Outpatient Encounter Medications as of 07/28/2024  Medication Sig   amLODipine  (NORVASC ) 5 MG tablet TAKE 1 TABLET (5 MG TOTAL) BY MOUTH DAILY.   lamoTRIgine   (LAMICTAL ) 200 MG tablet Take 1 tablet (200 mg total) by mouth daily.   mirtazapine  (REMERON ) 45 MG tablet Take 1 tablet (45 mg total) by mouth at bedtime.   pravastatin  (PRAVACHOL ) 10 MG tablet Take 1 tablet (10 mg total) by mouth daily.   propranolol  (INDERAL ) 10 MG tablet Take 1 tablet (10 mg total) by mouth 2 (two) times daily as needed.   RINVOQ 30 MG TB24 Take 1 tablet by mouth daily.   VTAMA 1 % CREA Apply topically.   zolpidem  (AMBIEN ) 5 MG tablet Take 1 tablet (5 mg total) by mouth at bedtime. for sleep   [DISCONTINUED] RINVOQ 15 MG TB24 Take 1 tablet by mouth daily.   No facility-administered encounter medications on file as of 07/28/2024.   "

## 2024-07-28 ENCOUNTER — Ambulatory Visit: Admitting: Family Medicine

## 2024-07-28 ENCOUNTER — Encounter: Payer: Self-pay | Admitting: Family Medicine

## 2024-07-28 ENCOUNTER — Ambulatory Visit (INDEPENDENT_AMBULATORY_CARE_PROVIDER_SITE_OTHER)
Admission: RE | Admit: 2024-07-28 | Discharge: 2024-07-28 | Disposition: A | Discharge status: Home / Self Care | Source: Ambulatory Visit | Attending: Family Medicine | Admitting: Family Medicine

## 2024-07-28 VITALS — BP 130/70 | HR 101 | Temp 98.8°F | Ht 68.0 in | Wt 204.5 lb

## 2024-07-28 DIAGNOSIS — M79644 Pain in right finger(s): Secondary | ICD-10-CM | POA: Diagnosis not present

## 2024-07-28 DIAGNOSIS — M20011 Mallet finger of right finger(s): Secondary | ICD-10-CM | POA: Diagnosis not present

## 2024-07-29 ENCOUNTER — Encounter: Payer: Self-pay | Admitting: Family Medicine

## 2024-08-07 ENCOUNTER — Other Ambulatory Visit: Payer: Self-pay | Admitting: Family Medicine

## 2024-08-12 ENCOUNTER — Telehealth (INDEPENDENT_AMBULATORY_CARE_PROVIDER_SITE_OTHER): Payer: Self-pay | Admitting: Student in an Organized Health Care Education/Training Program

## 2024-08-12 DIAGNOSIS — F3131 Bipolar disorder, current episode depressed, mild: Secondary | ICD-10-CM

## 2024-08-12 DIAGNOSIS — F99 Mental disorder, not otherwise specified: Secondary | ICD-10-CM

## 2024-08-12 DIAGNOSIS — F17200 Nicotine dependence, unspecified, uncomplicated: Secondary | ICD-10-CM

## 2024-08-12 DIAGNOSIS — F5105 Insomnia due to other mental disorder: Secondary | ICD-10-CM

## 2024-08-12 DIAGNOSIS — F4310 Post-traumatic stress disorder, unspecified: Secondary | ICD-10-CM

## 2024-08-12 DIAGNOSIS — F411 Generalized anxiety disorder: Secondary | ICD-10-CM | POA: Diagnosis not present
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
## 2024-08-13 NOTE — Addendum Note (Signed)
 Addended by: CARRION CARRERO, MARLO on: 08/13/2024 04:06 PM   Modules accepted: Level of Service

## 2024-08-30 ENCOUNTER — Ambulatory Visit: Admitting: Family Medicine

## 2024-09-06 ENCOUNTER — Telehealth (HOSPITAL_COMMUNITY): Payer: Self-pay

## 2024-09-06 ENCOUNTER — Telehealth (HOSPITAL_COMMUNITY): Payer: Self-pay | Admitting: *Deleted

## 2024-09-06 DIAGNOSIS — F3131 Bipolar disorder, current episode depressed, mild: Secondary | ICD-10-CM

## 2024-09-06 DIAGNOSIS — F411 Generalized anxiety disorder: Secondary | ICD-10-CM

## 2024-09-06 DIAGNOSIS — F4310 Post-traumatic stress disorder, unspecified: Secondary | ICD-10-CM

## 2024-09-06 DIAGNOSIS — F5105 Insomnia due to other mental disorder: Secondary | ICD-10-CM

## 2024-09-06 MED ORDER — MIRTAZAPINE 45 MG PO TABS: 45.0000 mg | ORAL_TABLET | Freq: Every day | ORAL | 0 refills | Status: DC

## 2024-09-06 MED ORDER — LAMOTRIGINE 200 MG PO TABS: 200.0000 mg | ORAL_TABLET | Freq: Every day | ORAL | 0 refills | Status: DC

## 2024-09-06 MED ORDER — ZOLPIDEM TARTRATE 5 MG PO TABS: 5.0000 mg | ORAL_TABLET | Freq: Every day | ORAL | 1 refills | Status: DC

## 2024-09-06 NOTE — Telephone Encounter (Signed)
 Pt continues to call [facility] office requesting refill of Ambien . FYI.

## 2024-09-06 NOTE — Telephone Encounter (Addendum)
 Dispense hx reviewed, refill of patient's psychotropic medications sent to patient's pharmacy to bridge her until her next appointment on 10/14/2024  - Ambien  5 mg nightly, 30 tablets,1 refill - Remeron  45 mg nightly, 30 tablets, 0 refills  - Lamictal  200 mg daily, 30 tablets, 0 refills     Dalya Maselli Carrin Carrero, MD PGY-3, [facility] Psychiatry

## 2024-09-06 NOTE — Addendum Note (Signed)
 Addended by: CARRION CARRERO, MARLO on: 09/06/2024 01:43 PM   Modules accepted: Orders

## 2024-09-06 NOTE — Telephone Encounter (Signed)
 Patient called in to request refill of Ambien  5 mg. Patient last seen by provider on 08/12/24.

## 2024-09-07 ENCOUNTER — Ambulatory Visit: Admitting: Family Medicine

## 2024-09-14 ENCOUNTER — Ambulatory Visit: Admitting: Family Medicine

## 2024-09-21 ENCOUNTER — Ambulatory Visit: Admitting: Family Medicine

## 2024-09-21 VITALS — BP 140/80 | HR 101 | Temp 98.3°F | Ht 68.0 in | Wt 213.0 lb

## 2024-09-21 DIAGNOSIS — M20011 Mallet finger of right finger(s): Secondary | ICD-10-CM | POA: Insufficient documentation

## 2024-09-21 NOTE — Assessment & Plan Note (Signed)
 Acute, status post 8 weeks of flexion splint, patient very compliant with this.  Unfortunately she still has significant mallet finger with only minimal ability to extend. Will have her continue wearing the splint but gave her tube gauze to place underneath to avoid rubbing of the splint.  Can use topical diclofenac gel for swelling or pain up to 4 times a day.  Offered referral to hand specialist but she is not interested at this time.

## 2024-09-21 NOTE — Progress Notes (Signed)
 Patient ID: Tricia [name], female    DOB: [birth year], 58 y.o.   MRN: [id number]  This visit was conducted in person.  BP (!) 140/80   Pulse (!) 101   Temp 98.3 F (36.8 C) (Temporal)   Ht 5' 8 (1.727 m)   Wt 213 lb (96.6 kg)   LMP 05/25/2020 (Exact Date)   SpO2 96%   BMI 32.39 kg/m    CC:  Chief Complaint  Patient presents with   Mallet Finger    Right Pinky    Subjective:   HPI: [place] [name] is a 58 y.o. female presenting on 09/21/2024 for Mallet Finger (Right Pinky)  Seen by sports medicine July 28, 2024 diagnosed with mallet deformity of right pinky finger following kickball injury. X-ray showed no fracture Applied splint to immobilize distal inner phalangeal joint.  Recommended keeping the DIP joint in extension continuously for 6 to 8 weeks.  Has been wearing brace  very consistently.. very compliant.         Relevant past medical, surgical, family and social history reviewed and updated as indicated. Interim medical history since our last visit reviewed. Allergies and medications reviewed and updated. Outpatient Medications Prior to Visit  Medication Sig Dispense Refill   amLODipine  (NORVASC ) 5 MG tablet TAKE 1 TABLET (5 MG TOTAL) BY MOUTH DAILY. 90 tablet 1   lamoTRIgine  (LAMICTAL ) 200 MG tablet Take 1 tablet (200 mg total) by mouth daily. 30 tablet 0   [START ON 10/01/2024] mirtazapine  (REMERON ) 45 MG tablet Take 1 tablet (45 mg total) by mouth at bedtime. 30 tablet 0   pravastatin  (PRAVACHOL ) 10 MG tablet Take 1 tablet (10 mg total) by mouth daily. 90 tablet 3   propranolol  (INDERAL ) 10 MG tablet Take 1 tablet (10 mg total) by mouth 2 (two) times daily as needed. 60 tablet 2   RINVOQ 30 MG TB24 Take 1 tablet by mouth daily.     VTAMA 1 % CREA Apply topically.     zolpidem  (AMBIEN ) 5 MG tablet Take 1 tablet (5 mg total) by mouth at bedtime. for sleep 30 tablet 1   No facility-administered medications prior to visit.     Per HPI unless  specifically indicated in ROS section below Review of Systems  Constitutional:  Negative for fatigue and fever.  HENT:  Negative for ear pain.   Eyes:  Negative for pain.  Respiratory:  Negative for chest tightness and shortness of breath.   Cardiovascular:  Negative for chest pain, palpitations and leg swelling.  Gastrointestinal:  Negative for abdominal pain.  Genitourinary:  Negative for dysuria.   Objective:  BP (!) 140/80   Pulse (!) 101   Temp 98.3 F (36.8 C) (Temporal)   Ht 5' 8 (1.727 m)   Wt 213 lb (96.6 kg)   LMP 05/25/2020 (Exact Date)   SpO2 96%   BMI 32.39 kg/m   Wt Readings from Last 3 Encounters:  09/21/24 213 lb (96.6 kg)  07/28/24 204 lb 8 oz (92.8 kg)  02/24/24 201 lb 8 oz (91.4 kg)      Physical Exam Constitutional:      General: She is not in acute distress.    Appearance: Normal appearance. She is well-developed. She is not ill-appearing or toxic-appearing.  HENT:     Head: Normocephalic.     Right Ear: Hearing, tympanic membrane, ear canal and external ear normal. Tympanic membrane is not erythematous, retracted or bulging.     Left Ear: Hearing,  tympanic membrane, ear canal and external ear normal. Tympanic membrane is not erythematous, retracted or bulging.     Nose: No mucosal edema or rhinorrhea.     Right Sinus: No maxillary sinus tenderness or frontal sinus tenderness.     Left Sinus: No maxillary sinus tenderness or frontal sinus tenderness.     Mouth/Throat:     Pharynx: Uvula midline.  Eyes:     General: Lids are normal. Lids are everted, no foreign bodies appreciated.     Conjunctiva/sclera: Conjunctivae normal.     Pupils: Pupils are equal, round, and reactive to light.  Neck:     Thyroid: No thyroid mass or thyromegaly.     Vascular: No carotid bruit.     Trachea: Trachea normal.  Cardiovascular:     Rate and Rhythm: Normal rate and regular rhythm.     Pulses: Normal pulses.     Heart sounds: Normal heart sounds, S1 normal and S2  normal. No murmur heard.    No friction rub. No gallop.  Pulmonary:     Effort: Pulmonary effort is normal. No tachypnea or respiratory distress.     Breath sounds: Normal breath sounds. No decreased breath sounds, wheezing, rhonchi or rales.  Abdominal:     General: Bowel sounds are normal.     Palpations: Abdomen is soft.     Tenderness: There is no abdominal tenderness.  Musculoskeletal:     Cervical back: Normal range of motion and neck supple.     Comments: Conointue mallet deformity of right pinky.. she is able to extend only 10 degrees, associated redness and swelling of DIP joint  Skin:    General: Skin is warm and dry.     Findings: No rash.  Neurological:     Mental Status: She is alert.  Psychiatric:        Mood and Affect: Mood is not anxious or depressed.        Speech: Speech normal.        Behavior: Behavior normal. Behavior is cooperative.        Thought Content: Thought content normal.        Judgment: Judgment normal.       Results for orders placed or performed in visit on 10/02/23  Hepatic function panel   Collection Time: 10/02/23  4:31 PM  Result Value Ref Range   Total Bilirubin 0.4 0.2 - 1.2 mg/dL   Bilirubin, Direct 0.1 0.0 - 0.3 mg/dL   Alkaline Phosphatase 60 39 - 117 U/L   AST 26 0 - 37 U/L   ALT 10 0 - 35 U/L   Total Protein 7.4 6.0 - 8.3 g/dL   Albumin 4.8 3.5 - 5.2 g/dL    Assessment and Plan  Mallet deformity of right little finger Assessment & Plan: Acute, status post 8 weeks of flexion splint, patient very compliant with this.  Unfortunately she still has significant mallet finger with only minimal ability to extend. Will have her continue wearing the splint but gave her tube gauze to place underneath to avoid rubbing of the splint.  Can use topical diclofenac gel for swelling or pain up to 4 times a day.  Offered referral to hand specialist but she is not interested at this time.     No follow-ups on file.   Greig Ring, MD

## 2024-10-14 ENCOUNTER — Telehealth (INDEPENDENT_AMBULATORY_CARE_PROVIDER_SITE_OTHER): Payer: Self-pay | Admitting: Student in an Organized Health Care Education/Training Program

## 2024-10-14 DIAGNOSIS — Z720 Tobacco use: Secondary | ICD-10-CM

## 2024-10-14 DIAGNOSIS — F99 Mental disorder, not otherwise specified: Secondary | ICD-10-CM

## 2024-10-14 DIAGNOSIS — Z8659 Personal history of other mental and behavioral disorders: Secondary | ICD-10-CM

## 2024-10-14 DIAGNOSIS — F411 Generalized anxiety disorder: Secondary | ICD-10-CM | POA: Diagnosis not present

## 2024-10-14 DIAGNOSIS — G4700 Insomnia, unspecified: Secondary | ICD-10-CM

## 2024-10-14 DIAGNOSIS — F3131 Bipolar disorder, current episode depressed, mild: Secondary | ICD-10-CM

## 2024-10-14 DIAGNOSIS — F4310 Post-traumatic stress disorder, unspecified: Secondary | ICD-10-CM | POA: Diagnosis not present

## 2024-10-14 MED ORDER — PROPRANOLOL HCL 10 MG PO TABS: 10.0000 mg | ORAL_TABLET | Freq: Two times a day (BID) | ORAL | 2 refills | Status: DC | PRN

## 2024-10-14 MED ORDER — ZOLPIDEM TARTRATE 5 MG PO TABS: 5.0000 mg | ORAL_TABLET | Freq: Every day | ORAL | 2 refills | Status: DC

## 2024-10-14 MED ORDER — LAMOTRIGINE 200 MG PO TABS: 200.0000 mg | ORAL_TABLET | Freq: Every day | ORAL | 0 refills | Status: DC

## 2024-10-14 MED ORDER — MIRTAZAPINE 45 MG PO TABS: 45.0000 mg | ORAL_TABLET | Freq: Every day | ORAL | 0 refills | Status: DC
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
## 2024-11-15 ENCOUNTER — Other Ambulatory Visit: Payer: Self-pay | Admitting: Family Medicine

## 2024-11-15 NOTE — Telephone Encounter (Signed)
 No labs in over a year. Ok to fill?

## 2024-11-15 NOTE — Telephone Encounter (Signed)
 Sent with note to schedule OV.  Thanks.

## 2024-12-06 ENCOUNTER — Telehealth (HOSPITAL_COMMUNITY): Admitting: Student in an Organized Health Care Education/Training Program

## 2024-12-06 DIAGNOSIS — F411 Generalized anxiety disorder: Secondary | ICD-10-CM | POA: Diagnosis not present

## 2024-12-06 DIAGNOSIS — F4310 Post-traumatic stress disorder, unspecified: Secondary | ICD-10-CM | POA: Diagnosis not present

## 2024-12-06 DIAGNOSIS — F3131 Bipolar disorder, current episode depressed, mild: Secondary | ICD-10-CM

## 2024-12-06 DIAGNOSIS — F99 Mental disorder, not otherwise specified: Secondary | ICD-10-CM | POA: Diagnosis not present

## 2024-12-06 DIAGNOSIS — F5105 Insomnia due to other mental disorder: Secondary | ICD-10-CM | POA: Diagnosis not present

## 2024-12-06 MED ORDER — LAMOTRIGINE 200 MG PO TABS: 200.0000 mg | ORAL_TABLET | Freq: Every day | ORAL | 0 refills | Status: AC

## 2024-12-06 MED ORDER — HYDROXYZINE HCL 10 MG PO TABS: 10.0000 mg | ORAL_TABLET | Freq: Three times a day (TID) | ORAL | 2 refills | Status: AC | PRN

## 2024-12-06 MED ORDER — PROPRANOLOL HCL 10 MG PO TABS: 10.0000 mg | ORAL_TABLET | Freq: Two times a day (BID) | ORAL | 2 refills | Status: AC | PRN

## 2024-12-06 MED ORDER — MIRTAZAPINE 45 MG PO TABS: 45.0000 mg | ORAL_TABLET | Freq: Every day | ORAL | 0 refills | Status: AC

## 2024-12-06 MED ORDER — ZOLPIDEM TARTRATE 5 MG PO TABS: 5.0000 mg | ORAL_TABLET | Freq: Every day | ORAL | 2 refills | Status: AC
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
## 2025-01-03 ENCOUNTER — Telehealth (HOSPITAL_COMMUNITY): Admitting: Student in an Organized Health Care Education/Training Program
